# Patient Record
Sex: Male | Born: 1965 | Race: White | Hispanic: No | Marital: Married | Smoking: Never smoker
Health system: Southern US, Community
[De-identification: ages and names within clinical notes are randomized; demographics above are authoritative.]

## PROBLEM LIST (undated history)

## (undated) DIAGNOSIS — Z0000 Encounter for general adult medical examination without abnormal findings: Secondary | ICD-10-CM

## (undated) DIAGNOSIS — R5383 Other fatigue: Secondary | ICD-10-CM

## (undated) DIAGNOSIS — R0609 Other forms of dyspnea: Secondary | ICD-10-CM

## (undated) DIAGNOSIS — E079 Disorder of thyroid, unspecified: Secondary | ICD-10-CM

## (undated) DIAGNOSIS — R001 Bradycardia, unspecified: Secondary | ICD-10-CM

## (undated) DIAGNOSIS — M25561 Pain in right knee: Secondary | ICD-10-CM

## (undated) DIAGNOSIS — M542 Cervicalgia: Secondary | ICD-10-CM

## (undated) DIAGNOSIS — E349 Endocrine disorder, unspecified: Secondary | ICD-10-CM

## (undated) DIAGNOSIS — M779 Enthesopathy, unspecified: Principal | ICD-10-CM

## (undated) DIAGNOSIS — J309 Allergic rhinitis, unspecified: Secondary | ICD-10-CM

## (undated) DIAGNOSIS — G4733 Obstructive sleep apnea (adult) (pediatric): Secondary | ICD-10-CM

## (undated) DIAGNOSIS — R0989 Other specified symptoms and signs involving the circulatory and respiratory systems: Secondary | ICD-10-CM

## (undated) DIAGNOSIS — R739 Hyperglycemia, unspecified: Secondary | ICD-10-CM

## (undated) DIAGNOSIS — Z025 Encounter for examination for participation in sport: Secondary | ICD-10-CM

## (undated) DIAGNOSIS — E785 Hyperlipidemia, unspecified: Secondary | ICD-10-CM

## (undated) DIAGNOSIS — T7840XA Allergy, unspecified, initial encounter: Secondary | ICD-10-CM

## (undated) DIAGNOSIS — R51 Headache: Secondary | ICD-10-CM

## (undated) DIAGNOSIS — R5381 Other malaise: Secondary | ICD-10-CM

## (undated) DIAGNOSIS — Z8619 Personal history of other infectious and parasitic diseases: Secondary | ICD-10-CM

## (undated) DIAGNOSIS — E663 Overweight: Secondary | ICD-10-CM

## (undated) DIAGNOSIS — G2581 Restless legs syndrome: Secondary | ICD-10-CM

## (undated) HISTORY — DX: Other specified symptoms and signs involving the circulatory and respiratory systems: R09.89

## (undated) HISTORY — DX: Restless legs syndrome: G25.81

## (undated) HISTORY — DX: Headache: R51

## (undated) HISTORY — DX: Pain in right knee: M25.561

## (undated) HISTORY — DX: Personal history of other infectious and parasitic diseases: Z86.19

## (undated) HISTORY — DX: Other forms of dyspnea: R06.09

## (undated) HISTORY — DX: Cervicalgia: M54.2

## (undated) HISTORY — DX: Hyperglycemia, unspecified: R73.9

## (undated) HISTORY — DX: Disorder of thyroid, unspecified: E07.9

## (undated) HISTORY — DX: Endocrine disorder, unspecified: E34.9

## (undated) HISTORY — DX: Overweight: E66.3

## (undated) HISTORY — DX: Allergy, unspecified, initial encounter: T78.40XA

## (undated) HISTORY — DX: Morbid (severe) obesity due to excess calories: E66.01

## (undated) HISTORY — DX: Other fatigue: R53.83

## (undated) HISTORY — DX: Other malaise: R53.81

## (undated) HISTORY — DX: Obstructive sleep apnea (adult) (pediatric): G47.33

## (undated) HISTORY — DX: Allergic rhinitis, unspecified: J30.9

## (undated) HISTORY — DX: Hyperlipidemia, unspecified: E78.5

## (undated) HISTORY — DX: Encounter for examination for participation in sport: Z02.5

## (undated) HISTORY — DX: Bradycardia, unspecified: R00.1

## (undated) HISTORY — DX: Encounter for general adult medical examination without abnormal findings: Z00.00

## (undated) HISTORY — DX: Enthesopathy, unspecified: M77.9

---

## 1998-10-20 HISTORY — PX: OTHER SURGICAL HISTORY: SHX169

## 2008-06-09 ENCOUNTER — Encounter: Payer: Self-pay | Admitting: Internal Medicine

## 2010-03-12 ENCOUNTER — Telehealth: Payer: Self-pay | Admitting: Internal Medicine

## 2010-03-12 ENCOUNTER — Ambulatory Visit: Payer: Self-pay | Admitting: Internal Medicine

## 2010-03-12 DIAGNOSIS — J309 Allergic rhinitis, unspecified: Secondary | ICD-10-CM | POA: Insufficient documentation

## 2010-03-12 DIAGNOSIS — R5383 Other fatigue: Secondary | ICD-10-CM | POA: Insufficient documentation

## 2010-03-12 DIAGNOSIS — R0989 Other specified symptoms and signs involving the circulatory and respiratory systems: Secondary | ICD-10-CM

## 2010-03-12 DIAGNOSIS — R5381 Other malaise: Secondary | ICD-10-CM

## 2010-03-12 DIAGNOSIS — R519 Headache, unspecified: Secondary | ICD-10-CM | POA: Insufficient documentation

## 2010-03-12 DIAGNOSIS — R51 Headache: Secondary | ICD-10-CM

## 2010-03-12 DIAGNOSIS — R0609 Other forms of dyspnea: Secondary | ICD-10-CM | POA: Insufficient documentation

## 2010-03-12 HISTORY — DX: Other specified symptoms and signs involving the circulatory and respiratory systems: R06.09

## 2010-03-12 HISTORY — DX: Headache: R51

## 2010-03-12 HISTORY — DX: Other malaise: R53.81

## 2010-03-12 HISTORY — DX: Allergic rhinitis, unspecified: J30.9

## 2010-03-12 HISTORY — DX: Other specified symptoms and signs involving the circulatory and respiratory systems: R09.89

## 2010-03-12 LAB — CONVERTED CEMR LAB
Albumin: 4.6 g/dL (ref 3.5–5.2)
Alkaline Phosphatase: 40 units/L (ref 39–117)
Bilirubin, Direct: 0.1 mg/dL (ref 0.0–0.3)
CO2: 22 meq/L (ref 19–32)
Chloride: 104 meq/L (ref 96–112)
Creatinine, Ser: 0.78 mg/dL (ref 0.40–1.50)
Eosinophils Absolute: 0.2 10*3/uL (ref 0.0–0.7)
Eosinophils Relative: 3 % (ref 0–5)
Glucose, Bld: 82 mg/dL (ref 70–99)
HCT: 43.6 % (ref 39.0–52.0)
HDL: 45 mg/dL (ref >39)
Hemoglobin: 14.7 g/dL (ref 13.0–17.0)
LDL Cholesterol: 159 mg/dL — ABNORMAL HIGH (ref 0–99)
Lymphs Abs: 2.5 10*3/uL (ref 0.7–4.0)
MCV: 85 fL (ref 78.0–100.0)
Monocytes Absolute: 1 10*3/uL (ref 0.1–1.0)
Monocytes Relative: 13 % — ABNORMAL HIGH (ref 3–12)
Neutrophils Relative %: 50 % (ref 43–77)
RBC: 5.13 M/uL (ref 4.22–5.81)
TSH: 6.165 microintl units/mL — ABNORMAL HIGH (ref 0.350–4.500)
Total Bilirubin: 0.6 mg/dL (ref 0.3–1.2)
WBC: 7.5 10*3/uL (ref 4.0–10.5)

## 2010-03-13 ENCOUNTER — Telehealth: Payer: Self-pay | Admitting: Internal Medicine

## 2010-03-13 ENCOUNTER — Encounter: Payer: Self-pay | Admitting: Internal Medicine

## 2010-03-13 DIAGNOSIS — E039 Hypothyroidism, unspecified: Secondary | ICD-10-CM | POA: Insufficient documentation

## 2010-04-09 ENCOUNTER — Ambulatory Visit (HOSPITAL_BASED_OUTPATIENT_CLINIC_OR_DEPARTMENT_OTHER): Admission: RE | Admit: 2010-04-09 | Discharge: 2010-04-09 | Payer: Self-pay | Admitting: Internal Medicine

## 2010-04-09 ENCOUNTER — Encounter: Payer: Self-pay | Admitting: Internal Medicine

## 2010-04-14 ENCOUNTER — Ambulatory Visit: Payer: Self-pay | Admitting: Pulmonary Disease

## 2010-04-24 ENCOUNTER — Telehealth: Payer: Self-pay | Admitting: Internal Medicine

## 2010-04-24 DIAGNOSIS — G4733 Obstructive sleep apnea (adult) (pediatric): Secondary | ICD-10-CM

## 2010-04-24 DIAGNOSIS — G4730 Sleep apnea, unspecified: Secondary | ICD-10-CM | POA: Insufficient documentation

## 2010-04-24 HISTORY — DX: Obstructive sleep apnea (adult) (pediatric): G47.33

## 2010-04-30 ENCOUNTER — Encounter: Payer: Self-pay | Admitting: Internal Medicine

## 2010-05-10 ENCOUNTER — Encounter: Payer: Self-pay | Admitting: Internal Medicine

## 2010-05-13 ENCOUNTER — Telehealth: Payer: Self-pay | Admitting: Internal Medicine

## 2010-06-07 ENCOUNTER — Ambulatory Visit: Payer: Self-pay | Admitting: Internal Medicine

## 2010-07-09 ENCOUNTER — Encounter: Payer: Self-pay | Admitting: Internal Medicine

## 2010-07-29 ENCOUNTER — Ambulatory Visit: Payer: Self-pay | Admitting: Internal Medicine

## 2010-07-29 DIAGNOSIS — J018 Other acute sinusitis: Secondary | ICD-10-CM | POA: Insufficient documentation

## 2010-10-03 ENCOUNTER — Telehealth: Payer: Self-pay | Admitting: Internal Medicine

## 2010-10-07 LAB — CONVERTED CEMR LAB
Cholesterol: 230 mg/dL — ABNORMAL HIGH (ref 0–200)
Triglycerides: 211 mg/dL — ABNORMAL HIGH (ref <150)
VLDL: 42 mg/dL — ABNORMAL HIGH (ref 0–40)

## 2010-11-19 NOTE — Assessment & Plan Note (Signed)
Summary: NEW PT EST CARE/DT   Vital Signs:  Patient profile:   45 year old male Height:      68.5 inches Weight:      236 pounds BMI:     35.49 O2 Sat:      97 % on Room air Temp:     97.8 degrees F oral Pulse rate:   65 / minute Pulse rhythm:   regular Resp:     18 per minute BP sitting:   100 / 72  (right arm) Cuff size:   large  Vitals Entered By: Glendell Docker CMA (Mar 12, 2010 1:26 PM)  O2 Flow:  Room air CC: Rm 2- New Patient Comments c/o chronic fatigue fro the past 5 months, loud snoring per wife, 2 boiled eggs at 6 am -nothing since then   Primary Care Provider:  Dondra Spry DO  CC:  Rm 2- New Patient.  History of Present Illness: 45 y/o white male to establish.  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  denies diabetes, CAD wife has noticed loud snoring prev weighed 190-200 when he lived in [place]  last physical 2 yrs ago mildly elevated cholesterol  usual diet reviewed  Preventive Screening-Counseling & Management  Alcohol-Tobacco     Alcohol drinks/day: <1     Alcohol type: beer     Smoking Status: never  Caffeine-Diet-Exercise     Caffeine use/day: 3-5 beverages daily     Does Patient Exercise: yes     Type of exercise: bicycling     Times/week: 3  Allergies (verified): No Known Drug Allergies  Past History:  Past Medical History: Allergic rhinitis Headache Obesity  Past Surgical History: Left Tendon Bicep Repair - Dr. 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  The patient denies chest pain, prolonged cough, abdominal pain, melena, hematochezia, severe indigestion/heartburn, and depression.         no polyuria or polydypsia  Physical Exam  General:  alert and overweight-appearing.   Eyes:  pupils equal, pupils round, and pupils reactive to light.   Neck:  No deformities, masses, or tenderness noted.no carotid bruits.   Lungs:  normal respiratory effort, normal breath sounds, no crackles, and no wheezes.   Heart:  normal rate, regular rhythm, no murmur, and no gallop.   Abdomen:  obese, non-tender, normal bowel sounds, no masses, no hepatomegaly, and no splenomegaly.   Msk:  smaller nodular cyst on base of middle finger Pulses:  dorsalis pedis and posterior tibial pulses are full and equal bilaterally Extremities:  No lower extremity edema Neurologic:  cranial nerves II-XII intact and gait normal.  Psych:  normally interactive, good eye contact, not anxious appearing, and not depressed appearing.     Impression & Recommendations:  Problem # 1:  FATIGUE (ICD-780.79) chronic fatigue in 45 y/o with obesity and loud snoring.  probable OSA vs fatigue assoc with shift work.  rule out other cause we discussed wt loss strategies in detail  Orders: T-Basic Metabolic Panel [phone]) T-Lipid Profile [phone]) T-Hepatic Function [phone]) T-CBC w/Diff [phone]) T-TSH [phone]) Sleep Disorder Referral (Sleep Disorder)  Problem # 2:  SNORING (ICD-786.09)  Orders: Sleep Disorder Referral (Sleep Disorder)  Complete Medication List: 1)  Multivitamins Tabs (Multiple vitamin) .... Take 1 tablet by mouth  once a day 2)  Claritin 10 Mg Tabs (Loratadine) .... Take 1 tablet by mouth once a day as needed 3)  Glucosamine-chondroitin Caps (Glucosamine-chondroit-vit c-mn) .... Take 1 capsule by mouth once a day  Patient Instructions: 1)  http://www.my-calorie-counter.com/ 2)  Limit to 1800 cal per day 3)  Goal weight loss 1-2 lbs per week 4)  Avoid soft drinks, sweet tea and fruit juices 5)  Limit carbohydrated to 30 grams per meal (100 grams per day) 6)  Please schedule a follow-up appointment in 3 months.  Current Allergies (reviewed today): No known allergies    Preventive Care Screening  Last Tetanus Booster:    Date:  03/10/2000    Results:  Historical

## 2010-11-19 NOTE — Letter (Signed)
   [place] [facility] [address] [place], [place]  [address]  [place] Phone: [phone]      Mar 13, 2010   NAYIB REMER [address] [place], [place] [address]  RE:  LAB RESULTS  Dear  Mr. Rodriguez,  The following is an interpretation of your most recent lab tests.  Please take note of any instructions provided or changes to medications that have resulted from your lab work.  ELECTROLYTES:  Good - no changes needed  KIDNEY FUNCTION TESTS:  Good - no changes needed  LIVER FUNCTION TESTS:  Good - no changes needed  LIPID PANEL:  Fair - review at your next visit Triglyceride: 210   Cholesterol: 246   LDL: 159   HDL: 45   Chol/HDL%:  5.5 Ratio  THYROID STUDIES:  Thyroid level too low TSH: 6.165     CBC:  Good - no changes needed       Sincerely Yours,    Dr. Thomos Lemons

## 2010-11-19 NOTE — Letter (Signed)
Summary: [facility]  [facility]   Imported By: Lanelle Bal 03/27/2010 13:28:22  _____________________________________________________________________  External Attachment:    Type:   Image     Comment:   External Document

## 2010-11-19 NOTE — Letter (Signed)
   [place] [facility] [address] [place], [place]  [address]  [place] Phone: [phone]      April 30, 2010   Dustin Spears [address] [place], [place] [address]  RE:  LAB RESULTS  Dear  [name],  The following is an interpretation of your most recent lab tests.  Please take note of any instructions provided or changes to medications that have resulted from your lab work.  ELECTROLYTES:  Good - no changes needed  KIDNEY FUNCTION TESTS:  Good - no changes needed  LIPID PANEL:  Fair - review at your next visit Triglyceride: 210   Cholesterol: 246   LDL: 159   HDL: 45   Chol/HDL%:  5.5 Ratio  THYROID STUDIES:  Thyroid level too low TSH: 6.165     CBC:  Good - no changes needed  Hypothyroidism can contribute to elevated cholesterol.  I suggest we repeat your cholesterol panel after hypothyroidism adequately treated.  You should still avoid saturated fats and exercise regularly.         Sincerely Yours,    Dr. Thomos Lemons

## 2010-11-19 NOTE — Letter (Signed)
Summary: Unable to Contact Patient/[facility]  Unable to Contact Patient/[facility]   Imported By: Lanelle Bal 07/23/2010 09:50:17  _____________________________________________________________________  External Attachment:    Type:   Image     Comment:   External Document

## 2010-11-19 NOTE — Progress Notes (Signed)
Summary: Lab Results  Phone Note Call from Patient Call back at Home Phone [phone]   Caller: Spouse- Tammy Summary of Call: patients wife called and left voice message requesting lab results. Her message states patient has not recieved the results from the blood work that he had done at his last office visit. Initial call taken by: Glendell Docker CMA,  April 24, 2010 8:00 AM  Follow-up for Phone Call        call returned to patient at [phone], no answer, voice message left informing patient call was being returned. Message left for patient to return call regarding test results Follow-up by: Glendell Docker CMA,  April 25, 2010 8:40 AM  Additional Follow-up for Phone Call Additional follow up Details #1::        SW wife, pt works at night so wakes up at 4pm, she will have him call us when he wakes up, he will also be inquiring about sleep study Diane Tomerlin  April 25, 2010 3:11 PM   New Problems: SLEEP APNEA, OBSTRUCTIVE (ICD-327.23)   Additional Follow-up for Phone Call Additional follow up Details #2::    patient informed sleep study was recieve in office yesterday, and Dr Artist Pais review he would recieve a return call regarding the test results. Patient was informed that Dr Artist Pais will return to the office on Tuesday. Patient is okay to wait until then for results Follow-up by: Glendell Docker CMA,  April 25, 2010 4:48 PM  Additional Follow-up for Phone Call Additional follow up Details #3:: Details for Additional Follow-up Action Taken: sleep study showed mild obstructive sleep apnea.  spleep specialist suggest wt loss and pt should consider using oral appliance.  we can refer him to Dr. Orpah Greek in [facility]  I will send letter on rest of labs Additional Follow-up by: D. Thomos Lemons DO,  April 30, 2010 1:11 PM  New Problems: SLEEP APNEA, OBSTRUCTIVE (ICD-327.23)  attempted to contact patient at [phone], no answer. A detailed voice message was left informing patient per Dr Artist Pais instructiions.  Patient was advised to call back if htere were any questions Glendell Docker [facility]  May 01, 2010 9:07 AM

## 2010-11-19 NOTE — Assessment & Plan Note (Signed)
Summary: feels like sinus infection/dt   Vital Signs:  Patient profile:   45 year old male Weight:      236.25 pounds BMI:     35.53 O2 Sat:      97 % on Room air Temp:     98.3 degrees F oral Pulse rate:   70 / minute Pulse rhythm:   regular Resp:     18 per minute BP sitting:   102 / 70  (right arm) Cuff size:   large  Vitals Entered By: Glendell Docker CMA (July 29, 2010 3:52 PM)  O2 Flow:  Room air  Contraindications/Deferment of Procedures/Staging:    Test/Procedure: FLU VAX    Reason for deferment: patient declined  CC: Chest Congestion Is Patient Diabetic? No Pain Assessment Patient in pain? no        Primary Care Provider:  Dondra Spry DO  CC:  Chest Congestion.  History of Present Illness: 45 y/o male c/o past week, sore throat, head and chest congestion, productive cough yellow in color, taken mucinex with no relief, motrin and Tylenol with relief of throat pain, cough is worse when lying down mild short of breath no fever  Preventive Screening-Counseling & Management  Alcohol-Tobacco     Smoking Status: never  Allergies (verified): No Known Drug Allergies  Past History:  Past Medical History: Allergic rhinitis Headache  Obesity    Past Surgical History: Left Tendon Bicep Repair - Dr. 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   Ears:  R and L TM retracted Mouth:  pharyngeal erythema.   Lungs:  normal respiratory effort, normal breath sounds, and no wheezes.   Heart:  normal rate, regular rhythm, and no gallop.     Impression & Recommendations:  Problem # 1:  RHINOSINUSITIS, ACUTE (ICD-461.8)  His updated medication list for this problem includes:    Cefuroxime Axetil 500 Mg Tabs (Cefuroxime axetil) ..... One  by mouth two times a day  Instructed on treatment. Call if symptoms persist or worsen.   Complete Medication List: 1)  Multivitamins Tabs (Multiple vitamin) .... Take 1 tablet by mouth once a day 2)  Claritin 10 Mg Tabs (Loratadine) .... Take 1 tablet by mouth once a day as needed 3)  Glucosamine-chondroitin Caps (Glucosamine-chondroit-vit c-mn) .... Take 1 capsule by mouth once a day 4)  Levothyroxine Sodium 50 Mcg Tabs (Levothyroxine sodium) .... Take 1 tablet by mouth once a day 5)  Cefuroxime Axetil 500 Mg Tabs (Cefuroxime axetil) .... One by mouth two times a day  Patient Instructions: 1)  Call our office if your symptoms do not  improve or gets worse. Prescriptions: CEFUROXIME AXETIL 500 MG TABS (CEFUROXIME AXETIL) one by mouth two times a day  #20 x 0   Entered and Authorized by:   D. Thomos Lemons DO   Signed by:   D. Thomos Lemons DO on 07/29/2010   Method used:   [facility]        [facility]  [place] [address] [facility]retail[facility]       [address]       [place], [place]  [address]       Ph: [phone] or [phone]       Fax: [phone]  RxID:   [id number]   Current Allergies (reviewed today): No known allergies    Immunization History:  Influenza Immunization History:    Influenza:  declined (07/29/2010)

## 2010-11-19 NOTE — Progress Notes (Signed)
Summary: Tsh result  Phone Note Outgoing Call   Summary of Call: call pt - blood test shows pt is hypothyroid (underactive thyroid),  I will discuss cause at next OV I suggest he start thyroid replacement.  see rx.  arrange repeat TSH and obtain thyroid antibody panel  in 2 months  244.90 Initial call taken by: D. Thomos Lemons DO,  Mar 13, 2010 7:27 AM  Follow-up for Phone Call        Left message on machine to return my call.  Mervin Kung CMA  Mar 13, 2010 8:58 AM   Pt notified per Dr. Olegario Messier instructions.  Lab appt. set for 05/10/10. Order created and sent to lab.  Mervin Kung CMA  Mar 14, 2010 4:14 PM   New Problems: HYPOTHYROIDISM (ICD-244.9)   New Problems: HYPOTHYROIDISM (ICD-244.9) New/Updated Medications: LEVOTHYROXINE SODIUM 50 MCG TABS (LEVOTHYROXINE SODIUM) 1/2 by mouth once daily x 2 weeks, then one by mouth once daily Prescriptions: LEVOTHYROXINE SODIUM 50 MCG TABS (LEVOTHYROXINE SODIUM) 1/2 by mouth once daily x 2 weeks, then one by mouth once daily  #30 x 2   Entered and Authorized by:   D. Thomos Lemons DO   Signed by:   D. Thomos Lemons DO on 03/13/2010   Method used:   [facility]        [facility]  [place] [address] [facility]       [address]       [place], [place]  [address]       Ph: [phone] or [phone]       Fax: [phone]   RxID:   [id number]

## 2010-11-19 NOTE — Progress Notes (Signed)
Summary: Lab Results  Phone Note Call from Patient Call back at Home Phone [phone]   Caller: Spouse-Tammy Call For: D. Thomos Lemons DO Reason for Call: Lab or Test Results Summary of Call: Patients wife called and left voice message requesting lab results for patient. She would also like to know if there is a dental clinic closer that patient could be set up, so they will not have to drive to [facility] Initial call taken by: Glendell Docker CMA,  May 13, 2010 5:15 PM  Follow-up for Phone Call        some lab results still pending.    we will call when full results avail  Follow-up by: D. Thomos Lemons DO,  May 13, 2010 5:22 PM  Additional Follow-up for Phone Call Additional follow up Details #1::        call returned to patient at [phone], no answer. A detailed voice message was left informing patient per Dr Artist Pais instructions Additional Follow-up by: Glendell Docker CMA,  May 14, 2010 9:19 AM    Additional Follow-up for Phone Call Additional follow up Details #2::    SW wife,Tammy Harig [phone], she wanted to ck for pt to see if the rest of his test results have come in Diane Tomerlin  May 16, 2010 3:21 PM  call returned to patient & wife, Babette Relic states patient is in need of refill of thyroid medication. He was advised per Dr Artist Pais that he will continue at the same dose. Once the remainder of labs are resulted out, patient would recieve a return call.  Patient & wife verbalized understanding and agrees to await on return call. Follow-up by: Glendell Docker CMA,  May 16, 2010 3:27 PM  Additional Follow-up for Phone Call Additional follow up Details #3:: Details for Additional Follow-up Action Taken: call pt - thyroid antibody neg. TSH improved with pt taking thyroid medication continue same dose.  Repeat TSH in 2 months 244.9.  FLP should be drawn at same time.  272.4 Additional Follow-up by: D. Thomos Lemons DO,  May 20, 2010 5:38 PM  New/Updated Medications: LEVOTHYROXINE SODIUM  50 MCG TABS (LEVOTHYROXINE SODIUM) Take 1 tablet by mouth once a day Prescriptions: LEVOTHYROXINE SODIUM 50 MCG TABS (LEVOTHYROXINE SODIUM) Take 1 tablet by mouth once a day  #30 x 3   Entered by:   Glendell Docker CMA   Authorized by:   D. Thomos Lemons DO   Signed by:   Glendell Docker CMA on 05/16/2010   Method used:   [facility]  [place] [address]       [address]       [place], [place]  [address]       Ph: [phone] or [phone]       Fax: [phone]   RxID:   [id number]  attempted to contact patient at [phone], no answer. A detailed voice message was left informing patient per Dr Artist Pais instructions. Message was left for patient to return call if any questions Glendell Docker [facility]  May 21, 2010 8:54 AM
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
## 2010-11-19 NOTE — Progress Notes (Signed)
Summary: request for medical records faxed to [facility]   Phone Note Outgoing Call   Call placed by: [facility] Call placed to: Marj Summary of Call: Request for medical records faxed to [facility] Initial call taken by: Roselle Locus,  Mar 12, 2010 1:26 PM

## 2010-11-21 NOTE — Progress Notes (Signed)
Summary: Thyroid Refill, additional question  Phone Note Call from Patient Call back at [facility] Phone [phone] Call back at [phone]. May leave detailed message on voicemail.   Caller: Patient Call For: D. Thomos Lemons DO Summary of Call: patient called requesting a refill on his Thyroid medication. Does patient need follow up blood work?  Patient does not have future appointment on file last office visit was  10/10 for sinus infection Initial call taken by: Glendell Docker CMA,  October 03, 2010 5:10 PM  Follow-up for Phone Call        refill x 1.  yes he needs repeat TSH 244.9 Follow-up by: D. Thomos Lemons DO,  October 03, 2010 5:31 PM  Additional Follow-up for Phone Call Additional follow up Details #1::        Left message for pt to return my call. Need to verify pharmacy and set up time for TSH. Nicki Guadalajara Fergerson CMA Duncan Dull)  October 04, 2010 8:36 AM     Additional Follow-up for Phone Call Additional follow up Details #2::    Pt notified. [facility]. Lab order has been placed and faxed to the lab for Monday. Pt asked about having his blood typed as he states he does not know what his is. Advised pt that this may not be covered by his insurance. Pt states he will check with his insurance and let us know if he wants to proceed with blood typing.  Nicki Guadalajara Fergerson CMA Duncan Dull)  October 04, 2010 9:33 AM   call pt - TSH is normal.  continue same dose of thyroid medication.  cholesterol is elevated.  I suggest pt start cholesterol medication.  see rx.  needs AST, ALT, FLP in 3 months then OV.   Dondra Spry DO,  October 08, 2010 9:28 AM  Left message on machine to return my call. Nicki Guadalajara Fergerson CMA Duncan Dull)  October 08, 2010 10:10 AM   Additional Follow-up for Phone Call Additional follow up Details #3:: Details for Additional Follow-up Action Taken: Pt notified and states he was not fasting for either blood draw that he has had. Wants to know if you still want him to start on  cholesterol medication? Please advise. Nicki Guadalajara Fergerson CMA Duncan Dull)  October 08, 2010 10:59 AM   yes,  LDL not affected by non fasting state. Dondra Spry DO,  October 08, 2010 5:11 PM  Left detailed message on voicemail per Dr Olegario Messier instruction. Lab order has been faxed to the lab and appt reminder has been mailed to pt. Nicki Guadalajara Fergerson CMA Duncan Dull)  October 09, 2010 11:42 AM    New/Updated Medications: LIPITOR 20 MG TABS (ATORVASTATIN CALCIUM) one by mouth once daily Prescriptions: LIPITOR 20 MG TABS (ATORVASTATIN CALCIUM) one by mouth once daily  #30 x 5   Entered and Authorized by:   D. Thomos Lemons DO   Signed by:   D. Thomos Lemons DO on 10/08/2010   Method used:   [facility] [address] [facility]       [address]       [place], [place]  [address]       Ph: [phone] or [phone]       Fax: [phone]   RxID:   [id number] LEVOTHYROXINE SODIUM 50 MCG TABS (LEVOTHYROXINE SODIUM) Take 1 tablet by mouth once a day  #90 x 1   Entered and Authorized by:   D.  Thomos Lemons DO   Signed by:   D. Thomos Lemons DO on 10/08/2010   Method used:   [facility]        [facility]  [place] [address] [facility]       [address]       [place], [place]  [address]       Ph: [phone] or [phone]       Fax: [phone]   RxID:   [id number] LEVOTHYROXINE SODIUM 50 MCG TABS (LEVOTHYROXINE SODIUM) Take 1 tablet by mouth once a day  #30 x 0   Entered by:   Mervin Kung CMA (AAMA)   Authorized by:   D. Thomos Lemons DO   Signed by:   Mervin Kung CMA (AAMA) on 10/04/2010   Method used:   [facility]        [facility]  [place] [address]       [address]       [place], [place]  [address]       Ph: [phone] or [phone]       Fax: [phone]   RxID:   [id number]

## 2010-12-16 ENCOUNTER — Telehealth: Payer: Self-pay | Admitting: Internal Medicine

## 2010-12-26 NOTE — Progress Notes (Addendum)
Summary: refill--atorvastatin  Phone Note From Pharmacy   Summary of Call: Received fax refill request from [facility] for atorvastatin 20mg . Pt needs f/u with Dr Artist Pais in March, has appt for bloodwork. Left message for pt to return my call. Pt still has refills at local pharmacy. Can discuss mail order refills at appt in March.  Nicki Guadalajara Fergerson CMA Duncan Dull)  December 16, 2010 3:36 PM   Follow-up for Phone Call        Left message on machine to return my call. sign   Additional Follow-up for Phone Call Additional follow up Details #1::        Spoke to pt, he states that insurance will not cover rx at local pharmacy (pt cost will be over $100). Refill sent to [facility]. Nicki Guadalajara Fergerson CMA (AAMA)  December 17, 2010 10:00 AM     Prescriptions: LIPITOR 20 MG TABS (ATORVASTATIN CALCIUM) one by mouth once daily  #90 x 0   Entered by:   Mervin Kung CMA (AAMA)   Authorized by:   D. Thomos Lemons DO   Signed by:   Mervin Kung CMA (AAMA) on 12/17/2010   Method used:   Electronically to        [facility] (mail-order)       [address]       [place], [place]  [address]       Ph: [phone]       Fax: [phone]   RxID:   [id number]   Appended Document: refill--atorvastatin Received call from [place] at [facility] stating they did not receive our authorization on 12/17/10. Gave verbal per phone note to [place].

## 2011-02-26 ENCOUNTER — Ambulatory Visit (INDEPENDENT_AMBULATORY_CARE_PROVIDER_SITE_OTHER): Payer: Managed Care, Other (non HMO) | Admitting: Family Medicine

## 2011-02-26 ENCOUNTER — Encounter: Payer: Self-pay | Admitting: Family Medicine

## 2011-02-26 DIAGNOSIS — E785 Hyperlipidemia, unspecified: Secondary | ICD-10-CM | POA: Insufficient documentation

## 2011-02-26 DIAGNOSIS — Z8619 Personal history of other infectious and parasitic diseases: Secondary | ICD-10-CM | POA: Insufficient documentation

## 2011-02-26 DIAGNOSIS — R0609 Other forms of dyspnea: Secondary | ICD-10-CM

## 2011-02-26 DIAGNOSIS — E663 Overweight: Secondary | ICD-10-CM

## 2011-02-26 DIAGNOSIS — J309 Allergic rhinitis, unspecified: Secondary | ICD-10-CM

## 2011-02-26 DIAGNOSIS — M25569 Pain in unspecified knee: Secondary | ICD-10-CM

## 2011-02-26 DIAGNOSIS — G4733 Obstructive sleep apnea (adult) (pediatric): Secondary | ICD-10-CM

## 2011-02-26 DIAGNOSIS — R5381 Other malaise: Secondary | ICD-10-CM

## 2011-02-26 DIAGNOSIS — E039 Hypothyroidism, unspecified: Secondary | ICD-10-CM

## 2011-02-26 DIAGNOSIS — R51 Headache: Secondary | ICD-10-CM

## 2011-02-26 DIAGNOSIS — M25561 Pain in right knee: Secondary | ICD-10-CM | POA: Insufficient documentation

## 2011-02-26 DIAGNOSIS — R5383 Other fatigue: Secondary | ICD-10-CM

## 2011-02-26 DIAGNOSIS — R0989 Other specified symptoms and signs involving the circulatory and respiratory systems: Secondary | ICD-10-CM

## 2011-02-26 HISTORY — DX: Overweight: E66.3

## 2011-02-26 HISTORY — DX: Morbid (severe) obesity due to excess calories: E66.01

## 2011-02-26 MED ORDER — LEVOTHYROXINE SODIUM 50 MCG PO TABS: 50.0000 ug | ORAL_TABLET | Freq: Every day | ORAL | Status: DC

## 2011-02-26 NOTE — Patient Instructions (Signed)
Hypercholesterolemia High Blood Cholesterol Cholesterol is a white, waxy, fat-like protein needed by your body in small amounts. The liver makes all the cholesterol you need. It is carried from the liver by the blood through the blood vessels. Deposits (plaque) may build up on blood vessel walls. This makes the arteries narrower and stiffer. Plaque increases the risk for heart attack and stroke. You cannot feel your cholesterol level even if it is very high. The only way to know is by a blood test to check your lipid (fats) levels. Once you know your cholesterol levels, you should keep a record of the test results. Work with your caregiver to to keep your levels in the desired range. WHAT THE RESULTS MEAN:  Total cholesterol is a rough measure of all the cholesterol in your blood.   LDL is the so-called bad cholesterol. This is the type that deposits cholesterol in the walls of the arteries. You want this level to be low.   HDL is the good cholesterol because it cleans the arteries and carries the LDL away. You want this level to be high.   Triglycerides are fat that the body can either burn for energy or store. High levels are closely linked to heart disease.  DESIRED LEVELS:  Total cholesterol below 200.   LDL below 100 for people at risk, below 70 for very high risk.   HDL above 50 is good, above 60 is best.   Triglycerides below 150.  HOW TO LOWER YOUR CHOLESTEROL:  Diet.   Choose fish or white meat chicken and [place], roasted or baked. Limit fatty cuts of red meat, fried foods, and processed meats, such as sausage and lunch meat.   Eat lots of fresh fruits and vegetables. Choose whole grains, beans, pasta, potatoes and cereals.   Use only small amounts of olive, corn or canola oils. Avoid butter, mayonnaise, shortening or palm kernel oils. Avoid foods with trans-fats.   Use skim/nonfat milk and low-fat/nonfat yogurt and cheeses. Avoid whole milk, cream, ice cream, egg yolks and  cheeses. Healthy desserts include angel food cake, gingersnaps, animal crackers, hard candy, popsicles, and low-fat/nonfat frozen yogurt. Avoid pastries, cakes, pies and cookies.   Exercise.   A regular program helps decrease LDL and raises HDL.   Helps with weight control.   Do things that increase your activity level like gardening, walking, or taking the stairs.   Medication.   May be prescribed by your caregiver to help lowering cholesterol and the risk for heart disease.   You may need medicine even if your levels are normal if you have several risk factors.  HOME CARE INSTRUCTIONS  Follow your diet and exercise programs as suggested by your caregiver.   Take medications as directed.   Have blood work done when your caregiver feels it is necessary.  MAKE SURE YOU:   Understand these instructions.   Will watch your condition.   Will get help right away if you are not doing well or get worse.  Document Released: 10/06/2005 Document Re-Released: 09/18/2008 [facility] Patient Information 2011 [place], [place].Hypothyroidism The thyroid is a large gland located in the lower front of your neck. The thyroid gland helps control metabolism. Metabolism is how your body handles food. It controls metabolism with the hormone thyroxine. When this gland is underactive (hypothyroid), it produces too little hormone.  SYMPTOMS OF HYPOTHYROIDISM  Lethargy (feeling as though you have no energy)   Cold intolerance   Weight gain (in spite of normal food intake)  Dry skin   Coarse hair   Menstrual irregularity (if severe, may lead to infertility)   Slowing of thought processes  Cardiac problems are also caused by insufficient amounts of thyroid hormone. Hypothyroidism in the newborn is cretinism, and is an extreme form. It is important that this form be treated adequately and immediately or it will lead rapidly to retarded physical and mental development. CAUSES OF HYPOTHYROIDISM These  include:   Absence or destruction of thyroid tissue.  Goiter due to iodine deficiency.   Goiter due to medications.   Congenital defects (since birth).  Problems with the pituitary. This causes a lack of TSH (thyroid stimulating hormone). This hormone tells the thyroid to turn out more hormone.   DIAGNOSIS To prove hypothyroidism, your caregiver may do blood tests and ultrasound tests. Sometimes the signs are hidden. It may be necessary for your caregiver to watch this illness with blood tests either before or after diagnosis and treatment. TREATMENT  Low levels of thyroid hormone are increased by using synthetic thyroid hormone. This is a safe, effective treatment. It usually takes about four weeks to gain the full effects of the medication. After you have the full effect of the medication, it will generally take another four weeks for problems to leave. Your caregiver may start you on low doses. If you have had heart problems the dose may be gradually increased. It is generally not an emergency to get rapidly to normal. HOME CARE INSTRUCTIONS  Take your medications as your caregiver suggests. Let your caregiver know of any medications you are taking or start taking. Your caregiver will help you with dosage schedules.   As your condition improves, your dosage needs may increase. It will be necessary to have continuing blood tests as suggested by your caregiver.   Report all suspected medication side effects to your caregiver.  SEEK MEDICAL CARE IF YOU DEVELOP:  Sweating.  Tremulousness (tremors).   Anxiety.   Rapid weight loss.   Heat intolerance.  Emotional swings.   Diarrhea.   Weakness.   SEEK IMMEDIATE MEDICAL CARE IF: You develop chest pain, an irregular heart beat (palpitations), or a rapid heart beat. MAKE SURE YOU:   Understand these instructions.   Will watch your condition.   Will get help right away if you are not doing well or get worse.  Document Released:  10/06/2005 Document Re-Released: 09/18/2008 [facility] Patient Information 2011 [place], [place].   Consider fish oil or flaxseed oil cap daily and add a fiber supplement: Benefiber 2 tsp po daily can mix in fluids or food

## 2011-02-26 NOTE — Assessment & Plan Note (Signed)
Patient to use his Claritin prn and consider Nasal saline prn, report worsening symptoms

## 2011-02-26 NOTE — Assessment & Plan Note (Signed)
Has been told he has an torn ligament and has considered surgery in the past but has not proceeded at this time the pain is worsening so he is beginning to consider proceeding. Will cont Aleve prn and Glucosamine Chondroitin daily

## 2011-02-26 NOTE — Assessment & Plan Note (Signed)
Patient tolerating his Lipitor, will check his FLP and he is encouraged to avoid trans fats and to add fish oil and fiber daily

## 2011-02-26 NOTE — Assessment & Plan Note (Signed)
Encouraged decreased po intake increased exercise and avoid trans fats

## 2011-02-26 NOTE — Assessment & Plan Note (Signed)
Patient did obtain a dental appliance last year which he didn't feel helped the snoring somewhat but over the last few months he feels the snoring has worsened again as  has his fatigue. Will consider a prescription dental appliance

## 2011-02-26 NOTE — Progress Notes (Signed)
Dustin Spears [id number] 03/09/1966 02/26/2011      Progress Note New Patient  Subjective  Chief Complaint  Chief Complaint  Patient presents with  . Establish Care    new pt    HPI  Patient is a 45 year old Caucasian male in today for new patient appointment. He has a complicated medical history of sleep apnea, hyperlipidemia, elevated body mass index, chronic musculoskeletal complaints. He has a bad right knee with torn ligaments considering surgery. Also has a long history of recurrent plantar fasciitis bilaterally and bilateral epicondylitis bilaterally. 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 Is doubling of worsening fatigue over the last 6 months. His previous sleep study showed mild sleep apnea with a low of 85% on his oxygen status dental appliance which she could not afford at that time but is now willing to consider. He has used over-the-counter dental appliance in the meantime which he thought was mildly helpful initially that at present he is snoring worse and his fatigue is increased. He denies any recent illness although he does have chronic nasal congestion secondary to allergies for which he takes Claritin and receives some relief. He denies any recent illness, fevers, chills. No CP/palp/SOB/GI or GU concerns. Does have numerous musculoskeletal complaints which are ongoing. 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 He reports he tore a ligament in that knee years ago and has been just managing it conservatively since then. He may consider surgery int he future. He has custom orthotics for for his recurrent plantar fasciitis and he feels that helps somewhat.   Past Medical History  Diagnosis Date  . Hyperlipidemia   . Allergy     seasonal  . Thyroid disease   . Overweight 02/26/2011  . Knee pain, right   . History of chicken pox   . SNORING 03/12/2010  . SLEEP APNEA, OBSTRUCTIVE  04/24/2010  . ALLERGIC RHINITIS 03/12/2010  . FATIGUE 03/12/2010  . Headache 03/12/2010    Past Surgical History  Procedure Date  . Bicep tendon in left arm 2000    Family History  Problem Relation Age of Onset  . Hyperlipidemia Mother   . Alcohol abuse Father     smoked  . Arthritis Father   . Lupus Paternal Grandmother   . Arthritis Maternal Grandmother   . Ulcers Maternal Grandmother   . Alzheimer's disease Maternal Grandfather     History   Social History  . Marital Status: Married    Spouse Name: N/A    Number of Children: N/A  . Years of Education: N/A   Occupational History  . Not on file.   Social History Main Topics  . Smoking status: Never Smoker   . Smokeless tobacco: Never Used  . Alcohol Use: Yes     occasional  . Drug Use: No  . Sexually Active: Yes   Other Topics Concern  . Not on file   Social History Narrative  . No narrative on file    No current outpatient prescriptions on file prior to visit.    No Known Allergies  Review of Systems  Gen.: Patient with persistent fatigue worsened over the last few months. Denies myalgias, fevers, chills. HEENT: Has a history of allergies no recent flares using using over-the-counter medications somewhat helpful. Denies headaches, ear pain, sore throat, epistaxis. Neck denies any dysphagia or trouble with range of  motion. Cardiovascular: Denies chest pain, palpitations. Pulmonary: Denies shortness of breath, cough, wheezing. GI: Denies constipation, diarrhea, bloody tarry stool. Denies nausea, vomiting, anorexia. GU: Patient denies urinary frequency, hesitancy, urgency, dysuria. There are patient denies any recent change in her concerning lesions, pruritus. Neuro patient denies any seizures or neurologic deficiencies. Psych: Patient denies any depression, anxiety or suicidal ideation.   Objective  BP 119/80  Pulse 66  Temp(Src) 98.4 F (36.9 C) (Oral)  Ht 5' 8.5" (1.74 m)  Wt 243 lb 12.8 oz (110.587 kg)   BMI 36.53 kg/m2  SpO2 96%  Physical Exam  Gen: W male, obese, NAD, good hygiene HEENT: NCAT, Ears nl pinna, TMs clear, no lesions in canal, right canal mildly erythematous, left canal clear. Nares are patent bilaterally. Boggy erythematous mucosa is noted. Oral pharynx is clear 0-1+ tonsils noted bilaterally, no erythema. No oral lesions. Neck is supple, no thyromegaly, no cervical lymphadenopathy noted, full range of motion. CV: Heart is noted to have a regular rate and rhythm. No murmur, rub appreciated. Pulm: Clear to auscultation bilaterally no wheezes rales or rhonchi. ABD: Positive bowel sounds all 4 quadrants, no hepatosplenomegaly no masses. No rebound, guarding. Derm: No concerning lesions Extr: No clubbing, cyanosis, edema. DTRs 2+ and B/L patellas. 5 out of 5 strength bilaterally. Neuro: Cranial nerves 2-12 intact. Gross motor and sensory intact. Psych: Normal affect. Alert and oriented x3. Memory intact.   Assessment and Plan   SNORING Patient did obtain a dental appliance last year which he didn't feel helped the snoring somewhat but over the last few months he feels the snoring has worsened again as  has his fatigue. Will consider a prescription dental appliance  SLEEP APNEA, OBSTRUCTIVE Patient did undergo a sleep study in June 2011 which showed mild obstructive sleep apnea. Since that time he actually has noted his fatigue is even worse and he continues to snore. He has tried an OTC dental appliance  But could note afford the cost of a visit to [facility] so he never went for that evaluation. He is willing to try a prescription appliance so we will try and arrange a consultation. If he is unable to afford the appliance we will order a CPAP machine  ALLERGIC RHINITIS Patient to use his Claritin prn and consider Nasal saline prn, report worsening symptoms  HEADACHE Patient is having them infrequently, may use Aleve prn with food, encouraged 8 hours of sleep,  increase exercise, small, frequent meals  Hyperlipidemia Patient tolerating his Lipitor, will check his FLP and he is encouraged to avoid trans fats and to add fish oil and fiber daily  Overweight Encouraged decreased po intake increased exercise and avoid trans fats  Knee pain, right Has been told he has an torn ligament and has considered surgery in the past but has not proceeded at this time the pain is worsening so he is beginning to consider proceeding. Will cont Aleve prn and Glucosamine Chondroitin daily  HYPOTHYROIDISM Given a 30 day refill on his Levothyroxine and we will check a level later this week.

## 2011-02-26 NOTE — Assessment & Plan Note (Signed)
Patient is having them infrequently, may use Aleve prn with food, encouraged 8 hours of sleep, increase exercise, small, frequent meals

## 2011-02-26 NOTE — Assessment & Plan Note (Addendum)
Patient did undergo a sleep study in June 2011 which showed mild obstructive sleep apnea. Since that time he actually has noted his fatigue is even worse and he continues to snore. He has tried an OTC dental appliance  But could note afford the cost of a visit to [facility] so he never went for that evaluation. He is willing to try a prescription appliance so we will try and arrange a consultation. If he is unable to afford the appliance we will order a CPAP machine

## 2011-02-27 NOTE — Assessment & Plan Note (Signed)
Given a 30 day refill on his Levothyroxine and we will check a level later this week.

## 2011-02-28 ENCOUNTER — Other Ambulatory Visit: Payer: Managed Care, Other (non HMO)

## 2011-02-28 LAB — RENAL FUNCTION PANEL
Albumin: 4 g/dL (ref 3.5–5.2)
BUN: 20 mg/dL (ref 6–23)
Calcium: 9 mg/dL (ref 8.4–10.5)
Glucose, Bld: 85 mg/dL (ref 70–99)
Phosphorus: 3.5 mg/dL (ref 2.3–4.6)
Potassium: 4.4 mEq/L (ref 3.5–5.1)

## 2011-02-28 LAB — CBC WITH DIFFERENTIAL/PLATELET
Basophils Absolute: 0 10*3/uL (ref 0.0–0.1)
Basophils Relative: 0.7 % (ref 0.0–3.0)
Eosinophils Absolute: 0.2 10*3/uL (ref 0.0–0.7)
Hemoglobin: 14.9 g/dL (ref 13.0–17.0)
Lymphocytes Relative: 36.5 % (ref 12.0–46.0)
MCHC: 34.6 g/dL (ref 30.0–36.0)
Monocytes Relative: 11.6 % (ref 3.0–12.0)
Neutrophils Relative %: 48.3 % (ref 43.0–77.0)
RBC: 4.86 Mil/uL (ref 4.22–5.81)
RDW: 12.9 % (ref 11.5–14.6)

## 2011-02-28 LAB — HEPATIC FUNCTION PANEL
ALT: 28 U/L (ref 0–53)
Albumin: 4 g/dL (ref 3.5–5.2)
Bilirubin, Direct: 0.1 mg/dL (ref 0.0–0.3)
Total Protein: 6.8 g/dL (ref 6.0–8.3)

## 2011-02-28 LAB — LIPID PANEL
Cholesterol: 166 mg/dL (ref 0–200)
HDL: 42.2 mg/dL (ref >39.00)
LDL Cholesterol: 98 mg/dL (ref 0–99)
Triglycerides: 131 mg/dL (ref 0.0–149.0)
VLDL: 26.2 mg/dL (ref 0.0–40.0)

## 2011-02-28 LAB — TSH: TSH: 1.45 u[IU]/mL (ref 0.35–5.50)

## 2011-02-28 NOTE — Progress Notes (Signed)
Addended by: Baldemar Lenis on: 02/28/2011 09:17 AM   Modules accepted: Orders

## 2011-03-12 ENCOUNTER — Encounter: Payer: Self-pay | Admitting: Family Medicine

## 2011-03-12 ENCOUNTER — Ambulatory Visit (INDEPENDENT_AMBULATORY_CARE_PROVIDER_SITE_OTHER): Payer: Managed Care, Other (non HMO) | Admitting: Family Medicine

## 2011-03-12 VITALS — BP 122/79 | HR 62 | Temp 97.6°F | Ht 68.5 in | Wt 244.8 lb

## 2011-03-12 DIAGNOSIS — M542 Cervicalgia: Secondary | ICD-10-CM | POA: Insufficient documentation

## 2011-03-12 DIAGNOSIS — G4733 Obstructive sleep apnea (adult) (pediatric): Secondary | ICD-10-CM

## 2011-03-12 HISTORY — DX: Cervicalgia: M54.2

## 2011-03-12 MED ORDER — NAPROXEN 500 MG PO TABS: 500.0000 mg | ORAL_TABLET | Freq: Two times a day (BID) | ORAL | Status: AC | PRN

## 2011-03-12 MED ORDER — CYCLOBENZAPRINE HCL 10 MG PO TABS: ORAL_TABLET | ORAL | Status: DC

## 2011-03-12 MED ORDER — LIDOCAINE 5 % EX PTCH: 1.0000 | MEDICATED_PATCH | Freq: Two times a day (BID) | CUTANEOUS | Status: DC

## 2011-03-12 NOTE — Assessment & Plan Note (Signed)
Patient directly to the history of right neck pain. Over the weekend he was bending over and had a tightening in the right side of his neck which is persistent. He denies any trauma. This morning he woke up with the muscle on the right side with a stent to the point where it's difficult to turn his head. Reports he's had similar milder episodes in the past. Organisms on the arms, headache or other concerning associated symptoms. Will treat for spasm noted in his Sternocleidomastoid muscle with moist heat and gentle stretching and his instructed to use th Flexeril sparingly and his Naproxen bid with food for ht next week and report if symptoms do not improve

## 2011-03-12 NOTE — Patient Instructions (Signed)
Back Pain/Neck pain Back pain is one of the most common causes of pain. There are many causes of back pain. Most are not serious conditions.  CAUSES Your backbone (spinal column) is made up of 24 main vertebral bodies, the sacrum, and the coccyx. These are held together by muscles and tough, fibrous tissue (ligaments). Nerve roots pass through the openings between the vertebrae. A sudden move or injury to the back may cause injury to, or pressure on, these nerves. This may result in localized back pain or pain movement (radiation) into the buttocks, down the leg, and into the foot. Sharp, shooting pain from the buttock down the back of the leg (sciatica) is frequently associated with a ruptured (herniated) disc. Pain may be caused by muscle spasm alone. Your caregiver can often find the cause of your pain by the details of your symptoms and an exam. In some cases, you may need tests (such as X-rays). Your caregiver will work with you to decide if any tests are needed based on your specific exam. HOME CARE INSTRUCTIONS  Avoid an underactive lifestyle. Active exercise, as directed by your caregiver, is your greatest weapon against back pain.   Avoid hard physical activities (tennis, racquetball, water-skiing) if you are not in proper physical condition for it. This may aggravate and/or create problems.   If you have a back problem, avoid sports requiring sudden body movements. Swimming and walking are generally safer activities.   Maintain good posture.   Avoid becoming overweight (obese).   Use bed rest for only the most extreme, sudden (acute) episode. Your caregiver will help you determine how much bed rest is necessary.   For acute conditions, you may put ice on the injured area.   Put ice in a plastic bag.   Place a towel between your skin and the bag.   Leave the heat on for 15 minutes at a time,or as needed.   After you are improved and more active, it may help to apply heat for 30  minutes before activities.  See your caregiver if you are having pain that lasts longer than expected. Your caregiver can advise appropriate exercises and/or therapy if needed. With conditioning, most back problems can be avoided. SEEK IMMEDIATE MEDICAL CARE IF:  You have numbness, tingling, weakness, or problems with the use of your arms or legs.   You experience severe back pain not relieved with medicines.   There is a change in bowel or bladder control.   You have increasing pain in any area of the body, including your belly (abdomen).   You notice shortness of breath, dizziness, or feel faint.   You feel sick to your stomach (nauseous), are throwing up (vomiting), or become sweaty.   You notice discoloration of your toes or legs, or your feet get very cold.   Your back pain is getting worse.   You have an oral temperature above 104, not controlled by medicine.  MAKE SURE YOU:   Understand these instructions.   Will watch your condition.   Will get help right away if you are not doing well or get worse.  Document Released: 07/16/2005 Document Re-Released: 12/31/2009 [facility] Patient Information 2011 [place], [place]. Apply moist heat and gentle stretching twice daily for the next week and or as needed

## 2011-03-12 NOTE — Progress Notes (Signed)
Dustin Spears [id number] [birth year] 03/12/2011      Progress Note-Follow Up  Subjective  Chief Complaint  Chief Complaint  Patient presents with  . Shoulder Injury    X 1day- right    HPI  Patient is a 45 year old male comes in today with a couple days of treatment records that. He says he bent over and had pain on the right side of his neck radiating into the top of his right shoulder a couple of days ago. This morning the pain is worse in the neck is worse this is difficult to turn his head. There are no jugular symptoms, numbness or tingling in his hands or in his arms. She denies any headache any other neurologic complaints. He denies any trauma or recent injury but has had similar episodes in the distant past. No fevers, chills, chest pain, palpitations, shortness of breath, GI or GU complaints otherwise noted.  Past Medical History  Diagnosis Date  . Hyperlipidemia   . Allergy     seasonal  . Thyroid disease   . Overweight 02/26/2011  . Knee pain, right   . History of chicken pox   . SNORING 03/12/2010  . SLEEP APNEA, OBSTRUCTIVE 04/24/2010  . ALLERGIC RHINITIS 03/12/2010  . FATIGUE 03/12/2010  . Headache 03/12/2010  . Neck pain, acute 03/12/2011    Past Surgical History  Procedure Date  . Bicep tendon in left arm 2000    Family History  Problem Relation Age of Onset  . Hyperlipidemia Mother   . Alcohol abuse Father     smoked  . Arthritis Father   . Lupus Paternal Grandmother   . Arthritis Maternal Grandmother   . Ulcers Maternal Grandmother   . Alzheimer's disease Maternal Grandfather     History   Social History  . Marital Status: Married    Spouse Name: N/A    Number of Children: N/A  . Years of Education: N/A   Occupational History  . Not on file.   Social History Main Topics  . Smoking status: Never Smoker   . Smokeless tobacco: Never Used  . Alcohol Use: Yes     occasional  . Drug Use: No  . Sexually Active: Yes   Other Topics Concern  . Not  on file   Social History Narrative  . No narrative on file    Current Outpatient Prescriptions on File Prior to Visit  Medication Sig Dispense Refill  . atorvastatin (LIPITOR) 20 MG tablet Take 20 mg by mouth daily.        Marland Kitchen glucosamine-chondroitin 500-400 MG tablet Take 1 tablet by mouth daily.        Marland Kitchen levothyroxine (SYNTHROID, LEVOTHROID) 50 MCG tablet Take 1 tablet (50 mcg total) by mouth daily.  30 tablet  0  . loratadine (CLARITIN) 10 MG tablet Take 10 mg by mouth daily.          No Known Allergies  Review of Systems  Review of Systems  Constitutional: Negative for fever and malaise/fatigue.  HENT: Positive for neck pain. Negative for congestion.   Respiratory: Negative for cough, sputum production, shortness of breath and wheezing.   Cardiovascular: Negative for chest pain and palpitations.  Gastrointestinal: Negative for heartburn, nausea, vomiting and abdominal pain.  Genitourinary: Negative for dysuria, urgency, frequency and hematuria.  Musculoskeletal: Negative for myalgias and back pain.  Skin: Negative for rash.  Neurological: Negative for dizziness, tremors, sensory change, focal weakness, loss of consciousness, weakness and headaches.  Psychiatric/Behavioral: Negative for depression.  The patient is not nervous/anxious.     Objective  BP 122/79  Pulse 62  Temp(Src) 97.6 F (36.4 C) (Oral)  Ht 5' 8.5" (1.74 m)  Wt 244 lb 12.8 oz (111.041 kg)  BMI 36.68 kg/m2  SpO2 99%  Physical Exam  Physical Exam  Constitutional: He is oriented to person, place, and time and well-developed, well-nourished, and in no distress. No distress.  HENT:  Head: Normocephalic and atraumatic.  Eyes: Conjunctivae are normal.  Neck: Neck supple. No thyromegaly present.  Cardiovascular: Normal rate, regular rhythm and normal heart sounds.   No murmur heard. Pulmonary/Chest: Effort normal and breath sounds normal. No respiratory distress.  Abdominal: He exhibits no distension and  no mass. There is no tenderness.  Musculoskeletal: He exhibits no edema.  Neurological: He is alert and oriented to person, place, and time.  Skin: Skin is warm.  Psychiatric: Memory, affect and judgment normal.    Lab Results  Component Value Date   TSH 1.45 02/28/2011   Lab Results  Component Value Date   WBC 5.5 02/28/2011   HGB 14.9 02/28/2011   HCT 42.9 02/28/2011   MCV 88.4 02/28/2011   PLT 239.0 02/28/2011   Lab Results  Component Value Date   CREATININE 0.7 02/28/2011   BUN 20 02/28/2011   NA 138 02/28/2011   K 4.4 02/28/2011   CL 107 02/28/2011   CO2 24 02/28/2011   Lab Results  Component Value Date   ALT 28 02/28/2011   AST 25 02/28/2011   ALKPHOS 38* 02/28/2011   BILITOT 0.9 02/28/2011   Lab Results  Component Value Date   CHOL 166 02/28/2011   Lab Results  Component Value Date   HDL 42.20 02/28/2011   Lab Results  Component Value Date   LDLCALC 98 02/28/2011   Lab Results  Component Value Date   TRIG 131.0 02/28/2011   Lab Results  Component Value Date   CHOLHDL 4 02/28/2011     Assessment & Plan  SLEEP APNEA, OBSTRUCTIVE Patient interested in a dental appliance to treat his sleep apnea. Will have supplied the patient with names of some local dentists that can perform the evaluation and sizing of this device.  Neck pain, acute Patient directly to the history of right neck pain. Over the weekend he was bending over and had a tightening in the right side of his neck which is persistent. He denies any trauma. This morning he woke up with the muscle on the right side with a stent to the point where it's difficult to turn his head. Reports he's had similar milder episodes in the past. Organisms on the arms, headache or other concerning associated symptoms. Will treat for spasm noted in his Sternocleidomastoid muscle with moist heat and gentle stretching and his instructed to use th Flexeril sparingly and his Naproxen bid with food for ht next week and report if  symptoms do not improve

## 2011-03-12 NOTE — Assessment & Plan Note (Addendum)
Patient interested in a dental appliance to treat his sleep apnea. Will have supplied the patient with names of some local dentists that can perform the evaluation and sizing of this device.

## 2011-03-13 ENCOUNTER — Encounter: Payer: Self-pay | Admitting: Family Medicine

## 2011-04-07 ENCOUNTER — Other Ambulatory Visit: Payer: Self-pay | Admitting: Family Medicine

## 2011-05-07 ENCOUNTER — Other Ambulatory Visit: Payer: Self-pay | Admitting: Family Medicine

## 2011-05-22 ENCOUNTER — Encounter: Payer: Self-pay | Admitting: Family Medicine

## 2011-05-29 ENCOUNTER — Ambulatory Visit (INDEPENDENT_AMBULATORY_CARE_PROVIDER_SITE_OTHER): Payer: Managed Care, Other (non HMO) | Admitting: Family Medicine

## 2011-05-29 ENCOUNTER — Encounter: Payer: Self-pay | Admitting: Family Medicine

## 2011-05-29 VITALS — BP 114/73 | HR 53 | Temp 97.7°F | Ht 68.5 in | Wt 241.8 lb

## 2011-05-29 DIAGNOSIS — E039 Hypothyroidism, unspecified: Secondary | ICD-10-CM

## 2011-05-29 DIAGNOSIS — J309 Allergic rhinitis, unspecified: Secondary | ICD-10-CM

## 2011-05-29 DIAGNOSIS — M542 Cervicalgia: Secondary | ICD-10-CM

## 2011-05-29 DIAGNOSIS — G4733 Obstructive sleep apnea (adult) (pediatric): Secondary | ICD-10-CM

## 2011-05-29 DIAGNOSIS — E785 Hyperlipidemia, unspecified: Secondary | ICD-10-CM

## 2011-05-29 MED ORDER — ATORVASTATIN CALCIUM 20 MG PO TABS: ORAL_TABLET | ORAL | Status: DC

## 2011-05-29 NOTE — Assessment & Plan Note (Signed)
Patient using Loratadine prn with good results

## 2011-05-29 NOTE — Assessment & Plan Note (Signed)
Improved with moist heat and gentle stretching, now uses Flexeril and Naproxen prn and infrequently for any recurrent pain or when he over exerts himself. May continue the same

## 2011-05-29 NOTE — Progress Notes (Signed)
Dustin Spears [id number] [birth year] 05/29/2011      Progress Note-Follow Up  Subjective  Chief Complaint  Chief Complaint  Patient presents with  . Follow-up    3 month follow up    HPI  Patient is a 45 yo caucasian male who is in today for follow up. He is doing well. His neck and back pain has improved. He has been using Flexeril and Naproxen prn when he overdose it or if pain flares a bit. He is also using moist heat and gentle stretching. No severe pain. He is tolerating his Lipitor and taking it more regularly now. Is also taking fish oil. No recent illness, fevers, chills, CP/palp/SOB/GI or GU c/o. His allergies are wlel controlled on occasional Loratadine. He has not been able to find anyone to fit him with an oral dental appliance to manage his sleep apnea.  Past Medical History  Diagnosis Date  . Hyperlipidemia   . Allergy     seasonal  . Thyroid disease   . Overweight 02/26/2011  . Knee pain, right   . History of chicken pox   . SNORING 03/12/2010  . SLEEP APNEA, OBSTRUCTIVE 04/24/2010  . ALLERGIC RHINITIS 03/12/2010  . FATIGUE 03/12/2010  . Headache 03/12/2010  . Neck pain, acute 03/12/2011    Past Surgical History  Procedure Date  . Bicep tendon in left arm 2000    Family History  Problem Relation Age of Onset  . Hyperlipidemia Mother   . Alcohol abuse Father     smoked  . Arthritis Father   . Lupus Paternal Grandmother   . Arthritis Maternal Grandmother   . Ulcers Maternal Grandmother   . Alzheimer's disease Maternal Grandfather     History   Social History  . Marital Status: Married    Spouse Name: N/A    Number of Children: 1  . Years of Education: N/A   Occupational History  . 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 [employer]    Social History Main Topics  . Smoking status: Never Smoker   . Smokeless tobacco: Never Used  . Alcohol Use: Yes     occasional  . Drug Use: No  . Sexually Active: Yes   Other Topics Concern  . Not on file   Social History  Narrative   Grew up in [place] to [employer] 1990    Current Outpatient Prescriptions on File Prior to Visit  Medication Sig Dispense Refill  . cyclobenzaprine (FLEXERIL) 10 MG tablet 1/2 to 1 tab po 3 x daily as needed for pain, may cause sedation  40 tablet  1  . fish oil-omega-3 fatty acids 1000 MG capsule Take 1 g by mouth daily.        Marland Kitchen glucosamine-chondroitin 500-400 MG tablet Take 1 tablet by mouth daily.        Marland Kitchen levothyroxine (SYNTHROID, LEVOTHROID) 50 MCG tablet TAKE 1 TABLET BY MOUTH EVERY DAY  30 tablet  0  . loratadine (CLARITIN) 10 MG tablet Take 10 mg by mouth daily.        . multivitamin (THERAGRAN) tablet Take 1 tablet by mouth daily.        . naproxen (NAPROSYN) 500 MG tablet Take 1 tablet (500 mg total) by mouth 2 (two) times daily as needed (pain). With meals  60 tablet  2    No Known Allergies  Review of Systems  Review of Systems  Constitutional: Positive for malaise/fatigue. Negative for fever.  HENT: Negative for congestion.  Eyes: Negative for discharge.  Respiratory: Negative for shortness of breath.   Cardiovascular: Negative for chest pain, palpitations and leg swelling.  Gastrointestinal: Negative for nausea, abdominal pain and diarrhea.  Genitourinary: Negative for dysuria.  Musculoskeletal: Negative for falls.  Skin: Negative for rash.  Neurological: Negative for loss of consciousness and headaches.  Endo/Heme/Allergies: Negative for polydipsia.  Psychiatric/Behavioral: Negative for depression and suicidal ideas. The patient is not nervous/anxious and does not have insomnia.     Objective  BP 114/73  Pulse 53  Temp(Src) 97.7 F (36.5 C) (Oral)  Ht 5' 8.5" (1.74 m)  Wt 241 lb 12.8 oz (109.68 kg)  BMI 36.23 kg/m2  SpO2 99%  Physical Exam  Physical Exam  Constitutional: He is oriented to person, place, and time and well-developed, well-nourished, and in no distress. No distress.  HENT:  Head: Normocephalic and atraumatic.  Eyes:  Conjunctivae are normal.  Neck: Neck supple. No thyromegaly present.  Cardiovascular: Normal rate, regular rhythm and normal heart sounds.   No murmur heard. Pulmonary/Chest: Effort normal and breath sounds normal. No respiratory distress.  Abdominal: He exhibits no distension and no mass. There is no tenderness.  Musculoskeletal: He exhibits no edema.  Neurological: He is alert and oriented to person, place, and time.  Skin: Skin is warm.  Psychiatric: Memory, affect and judgment normal.    Lab Results  Component Value Date   TSH 1.45 02/28/2011   Lab Results  Component Value Date   WBC 5.5 02/28/2011   HGB 14.9 02/28/2011   HCT 42.9 02/28/2011   MCV 88.4 02/28/2011   PLT 239.0 02/28/2011   Lab Results  Component Value Date   CREATININE 0.7 02/28/2011   BUN 20 02/28/2011   NA 138 02/28/2011   K 4.4 02/28/2011   CL 107 02/28/2011   CO2 24 02/28/2011   Lab Results  Component Value Date   ALT 28 02/28/2011   AST 25 02/28/2011   ALKPHOS 38* 02/28/2011   BILITOT 0.9 02/28/2011   Lab Results  Component Value Date   CHOL 166 02/28/2011   Lab Results  Component Value Date   HDL 42.20 02/28/2011   Lab Results  Component Value Date   LDLCALC 98 02/28/2011   Lab Results  Component Value Date   TRIG 131.0 02/28/2011   Lab Results  Component Value Date   CHOLHDL 4 02/28/2011     Assessment & Plan  Neck pain, acute Improved with moist heat and gentle stretching, now uses Flexeril and Naproxen prn and infrequently for any recurrent pain or when he over exerts himself. May continue the same  SLEEP APNEA, OBSTRUCTIVE Is interested in an oral appliance to manage his sleep apnea. Will continue to pursue  HYPOTHYROIDISM Well treated will continue to current dose and monitor  Hyperlipidemia Cholesterol greatly improved on Lipitor, he has tried to correct his diet and avoid trans fats, he will continue to take fish oil and we will try Lipitor at just 10mg  and recheck panel in 4  months.  ALLERGIC RHINITIS Patient using Loratadine prn with good results

## 2011-05-29 NOTE — Assessment & Plan Note (Signed)
Cholesterol greatly improved on Lipitor, he has tried to correct his diet and avoid trans fats, he will continue to take fish oil and we will try Lipitor at just 10mg  and recheck panel in 4 months.

## 2011-05-29 NOTE — Patient Instructions (Signed)

## 2011-05-29 NOTE — Assessment & Plan Note (Signed)
Well treated will continue to current dose and monitor

## 2011-05-29 NOTE — Assessment & Plan Note (Signed)
Is interested in an oral appliance to manage his sleep apnea. Will continue to pursue

## 2011-06-07 ENCOUNTER — Other Ambulatory Visit: Payer: Self-pay | Admitting: Family Medicine

## 2011-07-02 ENCOUNTER — Telehealth: Payer: Self-pay

## 2011-07-02 NOTE — Telephone Encounter (Signed)
Opened on accident

## 2011-07-07 ENCOUNTER — Other Ambulatory Visit: Payer: Self-pay | Admitting: Family Medicine

## 2011-08-06 ENCOUNTER — Other Ambulatory Visit: Payer: Self-pay | Admitting: Family Medicine

## 2011-09-01 ENCOUNTER — Other Ambulatory Visit: Payer: Self-pay | Admitting: Family Medicine

## 2011-09-05 ENCOUNTER — Telehealth: Payer: Self-pay

## 2011-09-05 NOTE — Telephone Encounter (Signed)
Patient's spouse states that patient is ready to get the CPAP machine.

## 2011-09-05 NOTE — Telephone Encounter (Signed)
So I need to know more about when and where he did his sleep study, he saw Dr Artist Pais before but I cannot readily find the study, if it is older than a year we may need to repeat study but we can do it at home

## 2011-09-08 ENCOUNTER — Telehealth: Payer: Self-pay

## 2011-09-08 NOTE — Telephone Encounter (Signed)
Patients spouse informed and is going to try to get the sleep study report to [place]

## 2011-09-08 NOTE — Telephone Encounter (Signed)
pts wife left a message stating that patient said md pulled up the sleep study report during his appt? He can't remember the name of the office he went to? I printed sleep study report and put on your desk.

## 2011-09-08 NOTE — Telephone Encounter (Signed)
Left a message for patients spouse to call me back

## 2011-09-09 NOTE — Telephone Encounter (Signed)
Diane called [facility] and filled out paperwork and put on MD's desk

## 2011-09-10 NOTE — Telephone Encounter (Signed)
Excellent, completed

## 2011-09-15 ENCOUNTER — Telehealth: Payer: Self-pay

## 2011-09-15 NOTE — Telephone Encounter (Signed)
Informed patients spouse that someone will contact her or Demoni when they get done with the referral

## 2011-09-15 NOTE — Telephone Encounter (Signed)
Pts spouse left a message stating she would like to know what is going on with her husbands CPAP? I left a message for patient to return my call. Dr Abner Greenspan has done a referral for [facility] to contact him.

## 2011-09-23 ENCOUNTER — Other Ambulatory Visit: Payer: Managed Care, Other (non HMO)

## 2011-09-25 ENCOUNTER — Other Ambulatory Visit (INDEPENDENT_AMBULATORY_CARE_PROVIDER_SITE_OTHER): Payer: Managed Care, Other (non HMO)

## 2011-09-25 DIAGNOSIS — E785 Hyperlipidemia, unspecified: Secondary | ICD-10-CM

## 2011-09-25 DIAGNOSIS — E039 Hypothyroidism, unspecified: Secondary | ICD-10-CM

## 2011-09-25 LAB — LIPID PANEL
HDL: 46.8 mg/dL (ref >39.00)
Triglycerides: 114 mg/dL (ref 0.0–149.0)

## 2011-09-25 LAB — CBC
MCHC: 33.1 g/dL (ref 30.0–36.0)
MCV: 90 fl (ref 78.0–100.0)
RDW: 13.1 % (ref 11.5–14.6)

## 2011-09-25 LAB — RENAL FUNCTION PANEL
BUN: 16 mg/dL (ref 6–23)
Creatinine, Ser: 0.8 mg/dL (ref 0.4–1.5)
GFR: 108.74 mL/min (ref >60.00)
Glucose, Bld: 99 mg/dL (ref 70–99)
Phosphorus: 3.5 mg/dL (ref 2.3–4.6)
Sodium: 137 mEq/L (ref 135–145)

## 2011-09-25 LAB — HEPATIC FUNCTION PANEL
Albumin: 4.3 g/dL (ref 3.5–5.2)
Alkaline Phosphatase: 38 U/L — ABNORMAL LOW (ref 39–117)
Total Protein: 7.3 g/dL (ref 6.0–8.3)

## 2011-09-25 LAB — TSH: TSH: 1.04 u[IU]/mL (ref 0.35–5.50)

## 2011-09-29 ENCOUNTER — Ambulatory Visit: Payer: Managed Care, Other (non HMO) | Admitting: Family Medicine

## 2011-10-03 ENCOUNTER — Ambulatory Visit (INDEPENDENT_AMBULATORY_CARE_PROVIDER_SITE_OTHER): Payer: Managed Care, Other (non HMO) | Admitting: Family Medicine

## 2011-10-03 ENCOUNTER — Encounter: Payer: Self-pay | Admitting: Family Medicine

## 2011-10-03 DIAGNOSIS — R5381 Other malaise: Secondary | ICD-10-CM

## 2011-10-03 DIAGNOSIS — R5383 Other fatigue: Secondary | ICD-10-CM

## 2011-10-03 DIAGNOSIS — E785 Hyperlipidemia, unspecified: Secondary | ICD-10-CM

## 2011-10-03 DIAGNOSIS — E039 Hypothyroidism, unspecified: Secondary | ICD-10-CM

## 2011-10-03 MED ORDER — BENEFIBER PO POWD: ORAL | Status: AC

## 2011-10-03 MED ORDER — KRILL OIL PO CAPS: 1.0000 | ORAL_CAPSULE | Freq: Every day | ORAL | Status: DC

## 2011-10-03 MED ORDER — CALCIUM CITRATE-VITAMIN D 200-250 MG-UNIT PO TABS: 1.0000 | ORAL_TABLET | Freq: Every day | ORAL | Status: DC

## 2011-10-03 NOTE — Patient Instructions (Signed)

## 2011-10-06 ENCOUNTER — Other Ambulatory Visit: Payer: Self-pay | Admitting: Family Medicine

## 2011-10-06 NOTE — Progress Notes (Signed)
Patient ID: Glennie Rodda, male   DOB: 12-13-1965, 45 y.o.   MRN: [id number] Kevante Lunt [id number] 09/23/1966 10/06/2011      Progress Note-Follow Up  Subjective  Chief Complaint  Chief Complaint  Patient presents with  . Follow-up    HPI  Physical Exam  Constitutional: He is oriented to person, place, and time and well-developed, well-nourished, and in no distress. No distress.  HENT:  Head: Normocephalic and atraumatic.  Eyes: Conjunctivae are normal.  Neck: Neck supple. No thyromegaly present.  Cardiovascular: Normal rate, regular rhythm and normal heart sounds.   No murmur heard. Pulmonary/Chest: Effort normal and breath sounds normal. No respiratory distress.  Abdominal: He exhibits no distension and no mass. There is no tenderness.  Musculoskeletal: He exhibits no edema.  Neurological: He is alert and oriented to person, place, and time.  Skin: Skin is warm.  Psychiatric: Memory, affect and judgment normal.    Past Medical History  Diagnosis Date  . Hyperlipidemia   . Allergy     seasonal  . Thyroid disease   . Overweight 02/26/2011  . Knee pain, right   . History of chicken pox   . SNORING 03/12/2010  . SLEEP APNEA, OBSTRUCTIVE 04/24/2010  . ALLERGIC RHINITIS 03/12/2010  . FATIGUE 03/12/2010  . Headache 03/12/2010  . Neck pain, acute 03/12/2011    Past Surgical History  Procedure Date  . Bicep tendon in left arm 2000    Family History  Problem Relation Age of Onset  . Hyperlipidemia Mother   . Alcohol abuse Father     smoked  . Arthritis Father   . Lupus Paternal Grandmother   . Arthritis Maternal Grandmother   . Ulcers Maternal Grandmother   . Alzheimer's disease Maternal Grandfather     History   Social History  . Marital Status: Married    Spouse Name: N/A    Number of Children: 1  . Years of Education: N/A   Occupational History  . 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 [employer]    Social History Main Topics  . Smoking status: Never Smoker   .  Smokeless tobacco: Never Used  . Alcohol Use: Yes     occasional  . Drug Use: No  . Sexually Active: Yes   Other Topics Concern  . Not on file   Social History Narrative   Grew up in [place] to [employer] 1990    Current Outpatient Prescriptions on File Prior to Visit  Medication Sig Dispense Refill  . atorvastatin (LIPITOR) 20 MG tablet 1/2 tab po qhs  30 tablet  0  . glucosamine-chondroitin 500-400 MG tablet Take 1 tablet by mouth daily.        . multivitamin (THERAGRAN) tablet Take 1 tablet by mouth daily.        . naproxen (NAPROSYN) 500 MG tablet Take 1 tablet (500 mg total) by mouth 2 (two) times daily as needed (pain). With meals  60 tablet  2    No Known Allergies  Review of Systems Review of Systems  Constitutional: Negative for fever and malaise/fatigue.  HENT: Negative for congestion.   Eyes: Negative for discharge.  Respiratory: Negative for shortness of breath.   Cardiovascular: Negative for chest pain, palpitations and leg swelling.  Gastrointestinal: Negative for nausea, abdominal pain and diarrhea.  Genitourinary: Negative for dysuria.  Musculoskeletal: Negative for falls.  Skin: Negative for rash.  Neurological: Negative for loss of consciousness and headaches.  Endo/Heme/Allergies: Negative for polydipsia.  Psychiatric/Behavioral: Negative  for depression and suicidal ideas. The patient is not nervous/anxious and does not have insomnia.     Objective  BP 119/90  Pulse 55  Temp 98.5 F (36.9 C)  Ht 5' 8.5" (1.74 m)  Wt 245 lb (111.131 kg)  BMI 36.71 kg/m2  SpO2 100%  Physical Exam  Patient is a 45 year old Caucasian male who is in today for followup on his new patient appointment. Overall good is doing well except that he has persistent fatigue. He is using his CPAP machine routinely and still weak and very tired. He is concerned that his statins may be contributing to his fatigue. He made no recent changes that are significant his diet lifestyle or  exercise that would explain his increasing fatigue.  Lab Results  Component Value Date   TSH 1.04 09/25/2011   Lab Results  Component Value Date   WBC 5.9 09/25/2011   HGB 14.7 09/25/2011   HCT 44.3 09/25/2011   MCV 90.0 09/25/2011   PLT 219.0 09/25/2011   Lab Results  Component Value Date   CREATININE 0.8 09/25/2011   BUN 16 09/25/2011   NA 137 09/25/2011   K 4.1 09/25/2011   CL 107 09/25/2011   CO2 23 09/25/2011   Lab Results  Component Value Date   ALT 32 09/25/2011   AST 29 09/25/2011   ALKPHOS 38* 09/25/2011   BILITOT 0.6 09/25/2011   Lab Results  Component Value Date   CHOL 176 09/25/2011   Lab Results  Component Value Date   HDL 46.80 09/25/2011   Lab Results  Component Value Date   LDLCALC 106* 09/25/2011   Lab Results  Component Value Date   TRIG 114.0 09/25/2011   Lab Results  Component Value Date   CHOLHDL 4 09/25/2011     Assessment & Plan  FATIGUE Continues to struggle despite the risk for sleep apnea. Also describes a decreased libido. Check a testosterone level at next visit. Encouraged healthy diet with small balance meals plenty of rest plenty of fluids and increased exercise  HYPOTHYROIDISM Adequately treated with TSH wnl  Hyperlipidemia Issues concerning his fatigue may be speaking to be 2 by statins. He'll try and make dietary changes and avoid transplant. He is to make her Reglan Daily we will hold Lipitor 2 months ago.

## 2011-10-06 NOTE — Assessment & Plan Note (Signed)
Issues concerning his fatigue may be speaking to be 2 by statins. He'll try and make dietary changes and avoid transplant. He is to make her Reglan Daily we will hold Lipitor 2 months ago.

## 2011-10-06 NOTE — Assessment & Plan Note (Signed)
Continues to struggle despite the risk for sleep apnea. Also describes a decreased libido. Check a testosterone level at next visit. Encouraged healthy diet with small balance meals plenty of rest plenty of fluids and increased exercise

## 2011-10-06 NOTE — Assessment & Plan Note (Signed)
Adequately treated with TSH wnl

## 2011-10-07 LAB — TESTOSTERONE: Testosterone: 231.08 ng/dL — ABNORMAL LOW (ref 350.00–890.00)

## 2011-10-10 ENCOUNTER — Ambulatory Visit: Payer: Managed Care, Other (non HMO) | Admitting: Family Medicine

## 2011-10-17 ENCOUNTER — Encounter: Payer: Self-pay | Admitting: Family Medicine

## 2011-10-17 ENCOUNTER — Ambulatory Visit (INDEPENDENT_AMBULATORY_CARE_PROVIDER_SITE_OTHER): Payer: Managed Care, Other (non HMO) | Admitting: Family Medicine

## 2011-10-17 VITALS — BP 116/77 | HR 54 | Temp 98.1°F | Ht 68.5 in | Wt 247.8 lb

## 2011-10-17 DIAGNOSIS — G4733 Obstructive sleep apnea (adult) (pediatric): Secondary | ICD-10-CM

## 2011-10-17 DIAGNOSIS — E785 Hyperlipidemia, unspecified: Secondary | ICD-10-CM

## 2011-10-17 DIAGNOSIS — R5383 Other fatigue: Secondary | ICD-10-CM

## 2011-10-17 DIAGNOSIS — E039 Hypothyroidism, unspecified: Secondary | ICD-10-CM

## 2011-10-17 DIAGNOSIS — R5381 Other malaise: Secondary | ICD-10-CM

## 2011-10-17 DIAGNOSIS — E349 Endocrine disorder, unspecified: Secondary | ICD-10-CM

## 2011-10-17 DIAGNOSIS — E291 Testicular hypofunction: Secondary | ICD-10-CM

## 2011-10-17 MED ORDER — TESTOSTERONE CYPIONATE 200 MG/ML IM SOLN
100.0000 mg | INTRAMUSCULAR | Status: DC
  Administered 2011-10-17: 100 mg via INTRAMUSCULAR

## 2011-10-17 MED ORDER — TESTOSTERONE CYPIONATE 100 MG/ML IM SOLN: 100.0000 mg | INTRAMUSCULAR | Status: DC

## 2011-10-17 MED ORDER — LEVOTHYROXINE SODIUM 50 MCG PO TABS: 50.0000 ug | ORAL_TABLET | Freq: Every day | ORAL | Status: DC

## 2011-10-17 NOTE — Patient Instructions (Signed)
Testosterone injection What is this medicine? TESTOSTERONE (tes TOS ter one) is the main male hormone. It supports normal male development such as muscle growth, facial hair, and deep voice. It is used in males to treat low testosterone levels. This medicine may be used for other purposes; ask your health care provider or pharmacist if you have questions. What should I tell my health care provider before I take this medicine? They need to know if you have any of these conditions: -breast cancer -diabetes -heart disease -kidney disease -liver disease -lung disease -prostate cancer, enlargement -an unusual or allergic reaction to testosterone, other medicines, foods, dyes, or preservatives -pregnant or trying to get pregnant -breast-feeding How should I use this medicine? This medicine is for injection into a muscle. It is usually given by a health care professional in a hospital or clinic setting. Contact your pediatrician regarding the use of this medicine in children. While this medicine may be prescribed for children as young as 12 years of age for selected conditions, precautions do apply. Overdosage: If you think you have taken too much of this medicine contact a poison control center or emergency room at once. NOTE: This medicine is only for you. Do not share this medicine with others. What if I miss a dose? Try not to miss a dose. Your doctor or health care professional will tell you when your next injection is due. Notify the office if you are unable to keep an appointment. What may interact with this medicine? -medicines for diabetes -medicines that treat or prevent blood clots like warfarin -oxyphenbutazone -propranolol -steroid medicines like prednisone or cortisone This list may not describe all possible interactions. Give your health care provider a list of all the medicines, herbs, non-prescription drugs, or dietary supplements you use. Also tell them if you smoke, drink  alcohol, or use illegal drugs. Some items may interact with your medicine. What should I watch for while using this medicine? Visit your doctor or health care professional for regular checks on your progress. They will need to check the level of testosterone in your blood. This medicine may affect blood sugar levels. If you have diabetes, check with your doctor or health care professional before you change your diet or the dose of your diabetic medicine. This drug is banned from use in athletes by most athletic organizations. What side effects may I notice from receiving this medicine? Side effects that you should report to your doctor or health care professional as soon as possible: -allergic reactions like skin rash, itching or hives, swelling of the face, lips, or tongue -breast enlargement -breathing problems -changes in mood, especially anger, depression, or rage -dark urine -general ill feeling or flu-like symptoms -light-colored stools -loss of appetite, nausea -nausea, vomiting -right upper belly pain -stomach pain -swelling of ankles -too frequent or persistent erections -trouble passing urine or change in the amount of urine -unusually weak or tired -yellowing of the eyes or skin Additional side effects that can occur in women include: -deep or hoarse voice -facial hair growth -irregular menstrual periods Side effects that usually do not require medical attention (report to your doctor or health care professional if they continue or are bothersome): -acne -change in sex drive or performance -hair loss -headache This list may not describe all possible side effects. Call your doctor for medical advice about side effects. You may report side effects to FDA at 1-800-FDA-1088. Where should I keep my medicine? Keep out of the reach of children. This medicine   can be abused. Keep your medicine in a safe place to protect it from theft. Do not share this medicine with anyone.  Selling or giving away this medicine is dangerous and against the law. Store at room temperature between 20 and 25 degrees C (68 and 77 degrees F). Do not freeze. Protect from light. Follow the directions for the product you are prescribed. Throw away any unused medicine after the expiration date. NOTE: This sheet is a summary. It may not cover all possible information. If you have questions about this medicine, talk to your doctor, pharmacist, or health care provider.  2012, [employer]/Gold Standard. (12/17/2007 4:13:46 PM) 

## 2011-10-19 ENCOUNTER — Encounter: Payer: Self-pay | Admitting: Family Medicine

## 2011-10-19 DIAGNOSIS — E349 Endocrine disorder, unspecified: Secondary | ICD-10-CM | POA: Insufficient documentation

## 2011-10-19 HISTORY — DX: Endocrine disorder, unspecified: E34.9

## 2011-10-19 NOTE — Assessment & Plan Note (Signed)
He does believe his fatigue has improved some since we stopped the Atorvastatin, will repeat lipid panel with next blood draw to [facility]

## 2011-10-19 NOTE — Assessment & Plan Note (Signed)
Multifactorial, improving but still significant, will continue to manage

## 2011-10-19 NOTE — Progress Notes (Signed)
Patient ID: Dustin Spears, male   DOB: 05-05-1966, 45 y.o.   MRN: [id number] Dustin Spears [id number] [birth year] 10/19/2011      Progress Note-Follow Up  Subjective  Chief Complaint  Chief Complaint  Patient presents with  . Follow-up    on testosterone    HPI  Patient is a 45 year old Caucasian male who is in today for followup of multiple medical problems. Labs at his last visit revealed low testosterone. We will initiate testosterone injections today. He continues to struggle with fatigue but does believe it is somewhat improved since he stopped his atorvastatin. He offers no other acute complaints today. Continues to use his CPAP machine routinely. Allergies are generally controlled at this time. Denies chest pain, palpitations, shortness of breath, GI or GU complaints at this time.  Past Medical History  Diagnosis Date  . Hyperlipidemia   . Allergy     seasonal  . Thyroid disease   . Overweight 02/26/2011  . Knee pain, right   . History of chicken pox   . SNORING 03/12/2010  . SLEEP APNEA, OBSTRUCTIVE 04/24/2010  . ALLERGIC RHINITIS 03/12/2010  . FATIGUE 03/12/2010  . Headache 03/12/2010  . Neck pain, acute 03/12/2011  . Testosterone deficiency 10/19/2011    Past Surgical History  Procedure Date  . Bicep tendon in left arm 2000    Family History  Problem Relation Age of Onset  . Hyperlipidemia Mother   . Alcohol abuse Father     smoked  . Arthritis Father   . Lupus Paternal Grandmother   . Arthritis Maternal Grandmother   . Ulcers Maternal Grandmother   . Alzheimer's disease Maternal Grandfather     History   Social History  . Marital Status: Married    Spouse Name: N/A    Number of Children: 1  . Years of Education: N/A   Occupational History  . 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 [employer]    Social History Main Topics  . Smoking status: Never Smoker   . Smokeless tobacco: Never Used  . Alcohol Use: Yes     occasional  . Drug Use: No  . Sexually Active: Yes    Other Topics Concern  . Not on file   Social History Narrative   Grew up in [place] to [employer] 1990    Current Outpatient Prescriptions on File Prior to Visit  Medication Sig Dispense Refill  . Calcium Citrate-Vitamin D 200-250 MG-UNIT TABS Take 1 capsule by mouth daily.  120 tablet  0  . glucosamine-chondroitin 500-400 MG tablet Take 1 tablet by mouth daily.        [place] Lanius CAPS Take 1 capsule by mouth daily.  30 capsule  0  . multivitamin (THERAGRAN) tablet Take 1 tablet by mouth daily.        . naproxen (NAPROSYN) 500 MG tablet Take 1 tablet (500 mg total) by mouth 2 (two) times daily as needed (pain). With meals  60 tablet  2  . Wheat Dextrin (BENEFIBER) POWD 2 tsp po daily in liquids  730 g  0   No current facility-administered medications on file prior to visit.    No Known Allergies  Review of Systems  Review of Systems  Constitutional: Positive for malaise/fatigue. Negative for fever.  HENT: Negative for congestion.   Eyes: Negative for discharge.  Respiratory: Negative for shortness of breath.   Cardiovascular: Negative for chest pain, palpitations and leg swelling.  Gastrointestinal: Negative for nausea, abdominal pain and diarrhea.  Genitourinary: Negative for dysuria.  Musculoskeletal: Negative for falls.  Skin: Negative for rash.  Neurological: Negative for loss of consciousness and headaches.  Endo/Heme/Allergies: Negative for polydipsia.  Psychiatric/Behavioral: Negative for depression and suicidal ideas. The patient is not nervous/anxious and does not have insomnia.     Objective  BP 116/77  Pulse 54  Temp(Src) 98.1 F (36.7 C) (Temporal)  Ht 5' 8.5" (1.74 m)  Wt 247 lb 12.8 oz (112.401 kg)  BMI 37.13 kg/m2  SpO2 97%  Physical Exam  Physical Exam  Constitutional: He is oriented to person, place, and time and well-developed, well-nourished, and in no distress. No distress.  HENT:  Head: Normocephalic and atraumatic.  Eyes: Conjunctivae are  normal.  Neck: Neck supple. No thyromegaly present.  Cardiovascular: Normal rate, regular rhythm and normal heart sounds.   No murmur heard. Pulmonary/Chest: Effort normal and breath sounds normal. No respiratory distress.  Abdominal: He exhibits no distension and no mass. There is no tenderness.  Musculoskeletal: He exhibits no edema.  Neurological: He is alert and oriented to person, place, and time.  Skin: Skin is warm.  Psychiatric: Memory, affect and judgment normal.    Lab Results  Component Value Date   TSH 1.04 09/25/2011   Lab Results  Component Value Date   WBC 5.9 09/25/2011   HGB 14.7 09/25/2011   HCT 44.3 09/25/2011   MCV 90.0 09/25/2011   PLT 219.0 09/25/2011   Lab Results  Component Value Date   CREATININE 0.8 09/25/2011   BUN 16 09/25/2011   NA 137 09/25/2011   K 4.1 09/25/2011   CL 107 09/25/2011   CO2 23 09/25/2011   Lab Results  Component Value Date   ALT 32 09/25/2011   AST 29 09/25/2011   ALKPHOS 38* 09/25/2011   BILITOT 0.6 09/25/2011   Lab Results  Component Value Date   CHOL 176 09/25/2011   Lab Results  Component Value Date   HDL 46.80 09/25/2011   Lab Results  Component Value Date   LDLCALC 106* 09/25/2011   Lab Results  Component Value Date   TRIG 114.0 09/25/2011   Lab Results  Component Value Date   CHOLHDL 4 09/25/2011     Assessment & Plan  Testosterone deficiency Agrees to treatment due to his symptomatic condition, will start injections 100mg  every other week and reassess in 8-10 weeks or as needed. Warned regarding possible side effects.  HYPOTHYROIDISM Check a tsh with next blood draw no change in dosing today  Hyperlipidemia He does believe his fatigue has improved some since we stopped the Atorvastatin, will repeat lipid panel with next blood draw to [facility]  FATIGUE Multifactorial, improving but still significant, will continue to manage  SLEEP APNEA, OBSTRUCTIVE Using CPAP daily

## 2011-10-19 NOTE — Assessment & Plan Note (Signed)
Check a tsh with next blood draw no change in dosing today

## 2011-10-19 NOTE — Assessment & Plan Note (Signed)
Agrees to treatment due to his symptomatic condition, will start injections 100mg  every other week and reassess in 8-10 weeks or as needed. Warned regarding possible side effects.

## 2011-10-19 NOTE — Assessment & Plan Note (Signed)
Using CPAP daily 

## 2011-10-31 ENCOUNTER — Ambulatory Visit (INDEPENDENT_AMBULATORY_CARE_PROVIDER_SITE_OTHER): Payer: Managed Care, Other (non HMO)

## 2011-10-31 DIAGNOSIS — E349 Endocrine disorder, unspecified: Secondary | ICD-10-CM

## 2011-10-31 DIAGNOSIS — E291 Testicular hypofunction: Secondary | ICD-10-CM

## 2011-10-31 MED ORDER — TESTOSTERONE CYPIONATE 200 MG/ML IM SOLN
100.0000 mg | INTRAMUSCULAR | Status: DC
  Administered 2011-10-31: 100 mg via INTRAMUSCULAR

## 2011-11-04 ENCOUNTER — Other Ambulatory Visit: Payer: Self-pay | Admitting: Family Medicine

## 2011-11-14 ENCOUNTER — Ambulatory Visit: Payer: Managed Care, Other (non HMO)

## 2011-11-17 ENCOUNTER — Ambulatory Visit (INDEPENDENT_AMBULATORY_CARE_PROVIDER_SITE_OTHER): Payer: Managed Care, Other (non HMO)

## 2011-11-17 DIAGNOSIS — E349 Endocrine disorder, unspecified: Secondary | ICD-10-CM

## 2011-11-17 DIAGNOSIS — E291 Testicular hypofunction: Secondary | ICD-10-CM

## 2011-11-17 MED ORDER — TESTOSTERONE CYPIONATE 200 MG/ML IM SOLN
100.0000 mg | INTRAMUSCULAR | Status: DC
  Administered 2011-11-17: 100 mg via INTRAMUSCULAR

## 2011-11-28 ENCOUNTER — Ambulatory Visit (INDEPENDENT_AMBULATORY_CARE_PROVIDER_SITE_OTHER): Payer: Managed Care, Other (non HMO)

## 2011-11-28 DIAGNOSIS — E349 Endocrine disorder, unspecified: Secondary | ICD-10-CM

## 2011-11-28 DIAGNOSIS — E291 Testicular hypofunction: Secondary | ICD-10-CM

## 2011-11-28 MED ORDER — TESTOSTERONE CYPIONATE 200 MG/ML IM SOLN
100.0000 mg | INTRAMUSCULAR | Status: DC
  Administered 2011-11-28: 100 mg via INTRAMUSCULAR

## 2011-12-08 ENCOUNTER — Other Ambulatory Visit: Payer: Self-pay | Admitting: Family Medicine

## 2011-12-08 NOTE — Telephone Encounter (Signed)
RX refilled x 1 year in 09/2011.  Per pharmacy RX is on hold.  She will process and delete refill request.

## 2011-12-12 ENCOUNTER — Ambulatory Visit (INDEPENDENT_AMBULATORY_CARE_PROVIDER_SITE_OTHER): Payer: Managed Care, Other (non HMO)

## 2011-12-12 DIAGNOSIS — E291 Testicular hypofunction: Secondary | ICD-10-CM

## 2011-12-12 MED ORDER — TESTOSTERONE CYPIONATE 200 MG/ML IM SOLN
100.0000 mg | INTRAMUSCULAR | Status: DC
  Administered 2011-12-12: 100 mg via INTRAMUSCULAR

## 2011-12-25 ENCOUNTER — Ambulatory Visit: Payer: Managed Care, Other (non HMO)

## 2011-12-25 ENCOUNTER — Other Ambulatory Visit: Payer: Managed Care, Other (non HMO)

## 2011-12-26 ENCOUNTER — Ambulatory Visit (INDEPENDENT_AMBULATORY_CARE_PROVIDER_SITE_OTHER): Payer: Managed Care, Other (non HMO)

## 2011-12-26 ENCOUNTER — Other Ambulatory Visit (INDEPENDENT_AMBULATORY_CARE_PROVIDER_SITE_OTHER): Payer: Managed Care, Other (non HMO)

## 2011-12-26 DIAGNOSIS — E039 Hypothyroidism, unspecified: Secondary | ICD-10-CM

## 2011-12-26 DIAGNOSIS — R5381 Other malaise: Secondary | ICD-10-CM

## 2011-12-26 DIAGNOSIS — E349 Endocrine disorder, unspecified: Secondary | ICD-10-CM

## 2011-12-26 DIAGNOSIS — E291 Testicular hypofunction: Secondary | ICD-10-CM

## 2011-12-26 DIAGNOSIS — E785 Hyperlipidemia, unspecified: Secondary | ICD-10-CM

## 2011-12-26 LAB — RENAL FUNCTION PANEL
BUN: 15 mg/dL (ref 6–23)
Chloride: 104 mEq/L (ref 96–112)
GFR: 107.1 mL/min (ref >60.00)
Phosphorus: 3.3 mg/dL (ref 2.3–4.6)
Sodium: 136 mEq/L (ref 135–145)

## 2011-12-26 LAB — HEPATIC FUNCTION PANEL
AST: 28 U/L (ref 0–37)
Alkaline Phosphatase: 33 U/L — ABNORMAL LOW (ref 39–117)
Bilirubin, Direct: 0 mg/dL (ref 0.0–0.3)
Total Bilirubin: 0.5 mg/dL (ref 0.3–1.2)

## 2011-12-26 LAB — CBC
MCHC: 33.6 g/dL (ref 30.0–36.0)
Platelets: 231 10*3/uL (ref 150.0–400.0)
RDW: 13.5 % (ref 11.5–14.6)
WBC: 6 10*3/uL (ref 4.5–10.5)

## 2011-12-26 LAB — LDL CHOLESTEROL, DIRECT: Direct LDL: 156.2 mg/dL

## 2011-12-26 LAB — LIPID PANEL
Cholesterol: 218 mg/dL — ABNORMAL HIGH (ref 0–200)
Total CHOL/HDL Ratio: 5
VLDL: 34 mg/dL (ref 0.0–40.0)

## 2011-12-26 LAB — TESTOSTERONE: Testosterone: 294.16 ng/dL — ABNORMAL LOW (ref 350.00–890.00)

## 2011-12-26 MED ORDER — TESTOSTERONE CYPIONATE 100 MG/ML IM SOLN: 100.0000 mg | INTRAMUSCULAR | Status: DC

## 2011-12-26 MED ORDER — TESTOSTERONE CYPIONATE 200 MG/ML IM SOLN
100.0000 mg | INTRAMUSCULAR | Status: DC
  Administered 2011-12-26: 100 mg via INTRAMUSCULAR

## 2011-12-30 MED ORDER — TESTOSTERONE CYPIONATE 100 MG/ML IM SOLN: 200.0000 mg | INTRAMUSCULAR | Status: DC

## 2012-01-02 ENCOUNTER — Ambulatory Visit: Payer: Managed Care, Other (non HMO) | Admitting: Family Medicine

## 2012-01-09 ENCOUNTER — Encounter: Payer: Self-pay | Admitting: Family Medicine

## 2012-01-09 ENCOUNTER — Ambulatory Visit (INDEPENDENT_AMBULATORY_CARE_PROVIDER_SITE_OTHER): Payer: Managed Care, Other (non HMO) | Admitting: Family Medicine

## 2012-01-09 VITALS — BP 112/73 | HR 74 | Temp 98.0°F | Ht 67.75 in | Wt 245.4 lb

## 2012-01-09 DIAGNOSIS — M25561 Pain in right knee: Secondary | ICD-10-CM

## 2012-01-09 DIAGNOSIS — E349 Endocrine disorder, unspecified: Secondary | ICD-10-CM

## 2012-01-09 DIAGNOSIS — E663 Overweight: Secondary | ICD-10-CM

## 2012-01-09 DIAGNOSIS — R5381 Other malaise: Secondary | ICD-10-CM

## 2012-01-09 DIAGNOSIS — G4733 Obstructive sleep apnea (adult) (pediatric): Secondary | ICD-10-CM

## 2012-01-09 DIAGNOSIS — M25569 Pain in unspecified knee: Secondary | ICD-10-CM

## 2012-01-09 DIAGNOSIS — E291 Testicular hypofunction: Secondary | ICD-10-CM

## 2012-01-09 DIAGNOSIS — R5383 Other fatigue: Secondary | ICD-10-CM

## 2012-01-09 MED ORDER — CALCIUM CITRATE-VITAMIN D 200-250 MG-UNIT PO TABS: 1.0000 | ORAL_TABLET | Freq: Every day | ORAL | Status: AC

## 2012-01-09 MED ORDER — TESTOSTERONE CYPIONATE 200 MG/ML IM SOLN: 300.0000 mg | INTRAMUSCULAR | Status: DC

## 2012-01-09 MED ORDER — TESTOSTERONE CYPIONATE 200 MG/ML IM SOLN
300.0000 mg | INTRAMUSCULAR | Status: AC
  Administered 2012-01-09: 300 mg via INTRAMUSCULAR

## 2012-01-09 NOTE — Patient Instructions (Signed)
Testosterone skin gel What is this medicine? TESTOSTERONE (tes TOS ter one) is the main male hormone. It supports normal male traits such as muscle growth, facial hair, and deep voice. This gel is used in males to treat low testosterone levels. This medicine may be used for other purposes; ask your health care provider or pharmacist if you have questions. What should I tell my health care provider before I take this medicine? They need to know if you have any of these conditions: -breast cancer -diabetes -heart disease -if a male partner is pregnant or trying to get pregnant -kidney disease -liver disease -lung disease -prostate cancer, enlargement -an unusual or allergic reaction to testosterone, soy proteins, other medicines, foods, dyes, or preservatives -pregnant or trying to get pregnant -breast-feeding How should I use this medicine? This medicine is for external use only. This medicine is applied at the same time every day (preferably in the morning) to clean, dry, intact skin. If you take a bath or shower in the morning, apply the gel after the bath or shower. Follow the directions on the prescription label. Make sure that you are using your testosterone gel product correctly and applying it only to the appropriate skin area (see below). Allow the skin to dry a few minutes then cover with clothing to prevent others from coming in contact with the medicine on your skin. The gel is flammable. Avoid fire, flame, or smoking until the gel has dried. Wash your hands with soap and water after use. For AndroGel Packets: Open the packet(s) needed for your dose. You can put the entire dose into your palm all at once or just a little at a time to apply. If you prefer, you can instead squeeze the gel directly onto the area you are applying it to. Apply on the shoulders, upper arm, or abdomen as directed. Do not apply to the scrotum or genitals. Be sure you use the correct total dose. It is best to  wait 5 to 6 hours after application of the gel before showering or swimming. For AndroGel 1%: Pump the dose into the palm of your hand. You can put the entire dose into your palm all at once or just a little at a time to apply. If you prefer, you can instead pump the gel directly onto the area you are applying it to. Apply on the shoulders, upper arm, or abdomen as directed. Do not apply to the scrotum or genitals. Be sure you use the correct total dose. It is best to wait for 5 to 6 hours after application of the gel before showering or swimming. For AndroGel 1.62%: Pump the dose into the palm of your hand. Dispense one pump of gel at a time into the palm of your hand before applying it. If you prefer, you can instead pump the gel directly onto the area you are applying it to. Apply on the shoulders and upper arms as directed. Do not apply to other parts of the body including the abdomen or genitals. Be sure you use the correct total dose. It is best to wait 2 hours after application of the gel before washing, showering, or swimming. For Testim: Open the tube(s) needed for your dose. Squeeze the gel from the tube into the palm of your hand. Apply on the shoulders or upper arms as directed. Do not apply to the scrotum, genitals, or abdomen. Be sure you use the correct total dose. Do not shower or swim for at least 2   hours after application of the gel. For Fortesta: Use the multi-dose pump to pump the gel directly onto the area you are applying it to. Apply on the thighs as directed. Do not apply to the abdomen, penis, scrotum, shoulders or upper arms. Gently rub the gel onto the skin using your finger. Be sure you use the correct total dose. Do not shower or swim for at least 2 hours after application of the gel. A special MedGuide will be given to you by the pharmacist with each prescription and refill. Be sure to read this information carefully each time. Talk to your pediatrician regarding the use of this  medicine in children. Special care may be needed. Overdosage: If you think you have taken too much of this medicine contact a poison control center or emergency room at once. NOTE: This medicine is only for you. Do not share this medicine with others. What if I miss a dose? If you miss a dose, use it as soon as you can. If it is almost time for your next dose, use only that dose. Do not use double or extra doses. What may interact with this medicine? -medicines for diabetes -medicines that treat or prevent blood clots like warfarin -oxyphenbutazone -propranolol -steroid medicines like prednisone or cortisone This list may not describe all possible interactions. Give your health care provider a list of all the medicines, herbs, non-prescription drugs, or dietary supplements you use. Also tell them if you smoke, drink alcohol, or use illegal drugs. Some items may interact with your medicine. What should I watch for while using this medicine? Visit your doctor or health care professional for regular checks on your progress. They will need to check the level of testosterone in your blood. This medicine can transfer from your body to others. If a person or pet comes in contact with the area where this medicine was applied to your skin, they may have a serious risk of side effects. If you cannot avoid skin-to-skin contact with another person, make sure the site where this medicine was applied is covered with clothing. If accidental contact happens, the skin of the person or pet should be washed right away with soap and water. Also, a male partner who is pregnant or trying to get pregnant should avoid contact with the gel or treated skin. This medicine may affect blood sugar levels. If you have diabetes, check with your doctor or health care professional before you change your diet or the dose of your diabetic medicine. This drug is banned from use in athletes by most athletic organizations. What side  effects may I notice from receiving this medicine? Side effects that you should report to your doctor or health care professional as soon as possible: -allergic reactions like skin rash, itching or hives, swelling of the face, lips, or tongue -breast enlargement -breathing problems -changes in mood, especially anger, depression, or rage -dark urine -general ill feeling or flu-like symptoms -light-colored stools -loss of appetite, nausea -nausea, vomiting -right upper belly pain -stomach pain -swelling of ankles -too frequent or persistent erections -trouble passing urine or change in the amount of urine -unusually weak or tired -yellowing of the eyes or skin Side effects that usually do not require medical attention (report to your doctor or health care professional if they continue or are bothersome): -acne -change in sex drive or performance -hair loss -headache This list may not describe all possible side effects. Call your doctor for medical advice about side effects. You may   report side effects to FDA at 1-800-FDA-1088. Where should I keep my medicine? Keep out of the reach of children. This medicine can be abused. Keep your medicine in a safe place to protect it from theft. Do not share this medicine with anyone. Selling or giving away this medicine is dangerous and against the law. Store at room temperature between 15 to 30 degrees C (59 to 86 degrees F). Keep closed until use. Protect from heat and light. This medicine is flammable. Avoid exposure to heat, fire, flame, and smoking. Throw away any unused medicine after the expiration date. NOTE: This sheet is a summary. It may not cover all possible information. If you have questions about this medicine, talk to your doctor, pharmacist, or health care provider.  2012, [employer]/Gold Standard. (02/18/2010 4:45:50 PM) 

## 2012-01-12 ENCOUNTER — Other Ambulatory Visit: Payer: Self-pay | Admitting: *Deleted

## 2012-01-12 ENCOUNTER — Encounter: Payer: Self-pay | Admitting: Family Medicine

## 2012-01-12 MED ORDER — LEVOTHYROXINE SODIUM 50 MCG PO TABS: 50.0000 ug | ORAL_TABLET | Freq: Every day | ORAL | Status: DC

## 2012-01-12 NOTE — Assessment & Plan Note (Signed)
Still running low but improving, given shot today, amount increased to 300 mg q weeks

## 2012-01-12 NOTE — Assessment & Plan Note (Signed)
Weight trending down again, encouraged DASH diet and increased activity.

## 2012-01-12 NOTE — Progress Notes (Signed)
Dustin Spears [id number] 1966/03/13 01/12/2012      Progress Note-Follow Up  Subjective  Chief Complaint  Chief Complaint  Patient presents with  . Follow-up    no concerns noted    HPI  46 year old Caucasian male who is in today for routine followup. Overall he says is improving. He does note still some persistent fatigue but says with CPAP for the sleep apnea and the testosterone injections he is much better. Feels more mentally clear and rested. No recent illness, fevers, chills, chest pain, palpitations, shortness of breath. He notes that cold weather he has not been exercising. Has not reported any excessive neck or knee pain. No recent flare in allergies or headaches thus far this spring.  Past Medical History  Diagnosis Date  . Hyperlipidemia   . Allergy     seasonal  . Thyroid disease   . Overweight 02/26/2011  . Knee pain, right   . History of chicken pox   . SNORING 03/12/2010  . SLEEP APNEA, OBSTRUCTIVE 04/24/2010  . ALLERGIC RHINITIS 03/12/2010  . FATIGUE 03/12/2010  . Headache 03/12/2010  . Neck pain, acute 03/12/2011  . Testosterone deficiency 10/19/2011    Past Surgical History  Procedure Date  . Bicep tendon in left arm 2000    Family History  Problem Relation Age of Onset  . Hyperlipidemia Mother   . Alcohol abuse Father     smoked  . Arthritis Father   . Lupus Paternal Grandmother   . Arthritis Maternal Grandmother   . Ulcers Maternal Grandmother   . Alzheimer's disease Maternal Grandfather     History   Social History  . Marital Status: Married    Spouse Name: N/A    Number of Children: 1  . Years of Education: N/A   Occupational History  . 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 [employer]    Social History Main Topics  . Smoking status: Never Smoker   . Smokeless tobacco: Never Used  . Alcohol Use: Yes     occasional  . Drug Use: No  . Sexually Active: Yes   Other Topics Concern  . Not on file   Social History Narrative   Grew up in [place] to [employer] 1990    Current Outpatient Prescriptions on File Prior to Visit  Medication Sig Dispense Refill  . glucosamine-chondroitin 500-400 MG tablet Take 1 tablet by mouth daily.        [facility] Lanius CAPS Take 1 capsule by mouth daily.  30 capsule  0  . levothyroxine (SYNTHROID, LEVOTHROID) 50 MCG tablet Take 1 tablet (50 mcg total) by mouth daily.  30 tablet  11  . levothyroxine (SYNTHROID, LEVOTHROID) 50 MCG tablet TAKE 1 TABLET BY MOUTH EVERY DAY  30 tablet  0  . multivitamin (THERAGRAN) tablet Take 1 tablet by mouth daily.        . naproxen (NAPROSYN) 500 MG tablet Take 1 tablet (500 mg total) by mouth 2 (two) times daily as needed (pain). With meals  60 tablet  2  . testosterone cypionate (DEPO-TESTOSTERONE) 100 MG/ML injection Inject 2 mLs (200 mg total) into the muscle every 14 (fourteen) days. For IM use only  10 mL  1  . Wheat Dextrin (BENEFIBER) POWD 2 tsp po daily in liquids  730 g  0    No Known Allergies  Review of Systems  Review of Systems  Constitutional: Positive for malaise/fatigue. Negative for fever.  HENT: Negative for congestion.   Eyes:  Negative for discharge.  Respiratory: Negative for shortness of breath.   Cardiovascular: Negative for chest pain, palpitations and leg swelling.  Gastrointestinal: Negative for nausea, abdominal pain and diarrhea.  Genitourinary: Negative for dysuria.  Musculoskeletal: Negative for falls.  Skin: Negative for rash.  Neurological: Negative for loss of consciousness and headaches.  Endo/Heme/Allergies: Negative for polydipsia.  Psychiatric/Behavioral: Negative for depression and suicidal ideas. The patient is not nervous/anxious and does not have insomnia.     Objective  BP 112/73  Pulse 74  Temp(Src) 98 F (36.7 C) (Oral)  Ht 5' 7.75" (1.721 m)  Wt 245 lb 6.4 oz (111.313 kg)  BMI 37.59 kg/m2  SpO2 97%  Physical Exam  Physical Exam  Constitutional: He is oriented to person, place, and time and well-developed,  well-nourished, and in no distress. No distress.  HENT:  Head: Normocephalic and atraumatic.  Eyes: Conjunctivae are normal.  Neck: Neck supple. No thyromegaly present.  Cardiovascular: Normal rate, regular rhythm and normal heart sounds.   No murmur heard. Pulmonary/Chest: Effort normal and breath sounds normal. No respiratory distress.  Abdominal: He exhibits no distension and no mass. There is no tenderness.  Musculoskeletal: He exhibits no edema.  Neurological: He is alert and oriented to person, place, and time.  Skin: Skin is warm.  Psychiatric: Memory, affect and judgment normal.    Lab Results  Component Value Date   TSH 2.08 12/26/2011   Lab Results  Component Value Date   WBC 6.0 12/26/2011   HGB 16.0 12/26/2011   HCT 47.7 12/26/2011   MCV 88.1 12/26/2011   PLT 231.0 12/26/2011   Lab Results  Component Value Date   CREATININE 0.8 12/26/2011   BUN 15 12/26/2011   NA 136 12/26/2011   K 4.5 12/26/2011   CL 104 12/26/2011   CO2 24 12/26/2011   Lab Results  Component Value Date   ALT 32 12/26/2011   AST 28 12/26/2011   ALKPHOS 33* 12/26/2011   BILITOT 0.5 12/26/2011   Lab Results  Component Value Date   CHOL 218* 12/26/2011   Lab Results  Component Value Date   HDL 40.50 12/26/2011   Lab Results  Component Value Date   LDLCALC 106* 09/25/2011   Lab Results  Component Value Date   TRIG 170.0* 12/26/2011   Lab Results  Component Value Date   CHOLHDL 5 12/26/2011     Assessment & Plan  SLEEP APNEA, OBSTRUCTIVE Using his CPAP regularly  Testosterone deficiency Still running low but improving, given shot today, amount increased to 300 mg q weeks  Knee pain, right Tolerable, no c/o today  Overweight Weight trending down again, encouraged DASH diet and increased activity.

## 2012-01-12 NOTE — Assessment & Plan Note (Signed)
Tolerable, no c/o today

## 2012-01-12 NOTE — Assessment & Plan Note (Signed)
Using his CPAP regularly

## 2012-01-12 NOTE — Telephone Encounter (Signed)
Faxed refill request received from pharmacy for 90 day supply levothyroxine. RX sent.

## 2012-01-23 ENCOUNTER — Ambulatory Visit: Payer: Managed Care, Other (non HMO)

## 2012-01-26 ENCOUNTER — Ambulatory Visit (INDEPENDENT_AMBULATORY_CARE_PROVIDER_SITE_OTHER): Payer: Managed Care, Other (non HMO)

## 2012-01-26 ENCOUNTER — Telehealth: Payer: Self-pay

## 2012-01-26 DIAGNOSIS — E349 Endocrine disorder, unspecified: Secondary | ICD-10-CM

## 2012-01-26 DIAGNOSIS — E291 Testicular hypofunction: Secondary | ICD-10-CM

## 2012-01-26 MED ORDER — "NEEDLE (DISP) 22G X 1-1/2"" MISC": Status: DC

## 2012-01-26 MED ORDER — SYRINGE (DISPOSABLE) 3 ML MISC: Status: DC

## 2012-01-26 MED ORDER — TESTOSTERONE CYPIONATE 200 MG/ML IM SOLN
200.0000 mg | INTRAMUSCULAR | Status: DC
  Administered 2012-01-26: 200 mg via INTRAMUSCULAR

## 2012-01-26 NOTE — Telephone Encounter (Signed)
I sent syringes and needles into pharmacy and left a message on patients voicemail to return my call

## 2012-01-26 NOTE — Telephone Encounter (Signed)
Patients spouse called requesting that an RX for needles and syringes go to [facility] in [place]? Pts daughter is a CMA and is willing to go ahead and give patient his testosterone shots? Please advise?

## 2012-01-26 NOTE — Telephone Encounter (Signed)
OK with me please callin a three month supply of the needles and syringes you use with him when he comes in here. That seems to be working well for him.

## 2012-01-26 NOTE — Progress Notes (Signed)
  Subjective:    Patient ID: Dustin Spears, male    DOB: 1966-09-17, 46 y.o.   MRN: [id number]  HPI    Review of Systems     Objective:   Physical Exam   Pt tolerated testosterone injection     Assessment & Plan:

## 2012-01-27 MED ORDER — TESTOSTERONE CYPIONATE 200 MG/ML IM SOLN: 200.0000 mg | INTRAMUSCULAR | Status: DC

## 2012-01-27 NOTE — Telephone Encounter (Signed)
Shredding last RX for Testosterone since it is for 100 mg Testosterone. Going to change it to 200mg . Patients spouse informed that syringes and needles have been faxed to pharmacy as well as Testosterone RX.

## 2012-01-27 NOTE — Telephone Encounter (Signed)
Addended by: Court Joy on: 01/27/2012 11:12 AM   Modules accepted: Orders

## 2012-02-06 ENCOUNTER — Ambulatory Visit: Payer: Managed Care, Other (non HMO)

## 2012-03-31 ENCOUNTER — Telehealth: Payer: Self-pay

## 2012-03-31 NOTE — Telephone Encounter (Signed)
Pts wife called with a couple of questions:  1. Pt has an appt Friday morning and she would like to know if he needs to be fasting. 2. Pts daughter is giving pt his testosterone injections and the last shot made him bleed really bad and he feels horrible like he didn't even have the injection? Pts spouse would like to know if the injection is not given in the right place will it be absorbed?  Please advise and call pts wife back at [phone]

## 2012-03-31 NOTE — Telephone Encounter (Signed)
Patient should come in fasting and have a testosterone, lipid, renal, cbc, hepatic, tsh done then. He will absorb less well if it is not injected in the right spot.

## 2012-04-01 NOTE — Telephone Encounter (Signed)
pts spouse informed 

## 2012-04-02 ENCOUNTER — Other Ambulatory Visit: Payer: Managed Care, Other (non HMO)

## 2012-04-07 ENCOUNTER — Other Ambulatory Visit (INDEPENDENT_AMBULATORY_CARE_PROVIDER_SITE_OTHER): Payer: Managed Care, Other (non HMO)

## 2012-04-07 DIAGNOSIS — E291 Testicular hypofunction: Secondary | ICD-10-CM

## 2012-04-07 DIAGNOSIS — E349 Endocrine disorder, unspecified: Secondary | ICD-10-CM

## 2012-04-07 DIAGNOSIS — E039 Hypothyroidism, unspecified: Secondary | ICD-10-CM

## 2012-04-07 DIAGNOSIS — E785 Hyperlipidemia, unspecified: Secondary | ICD-10-CM

## 2012-04-07 LAB — LIPID PANEL
Cholesterol: 220 mg/dL — ABNORMAL HIGH (ref 0–200)
Total CHOL/HDL Ratio: 6
Triglycerides: 110 mg/dL (ref 0.0–149.0)
VLDL: 22 mg/dL (ref 0.0–40.0)

## 2012-04-07 LAB — CBC
Hemoglobin: 16.6 g/dL (ref 13.0–17.0)
MCHC: 33.1 g/dL (ref 30.0–36.0)
RDW: 14 % (ref 11.5–14.6)

## 2012-04-07 LAB — RENAL FUNCTION PANEL
BUN: 18 mg/dL (ref 6–23)
Chloride: 107 mEq/L (ref 96–112)
Creatinine, Ser: 0.9 mg/dL (ref 0.4–1.5)
GFR: 96.07 mL/min (ref >60.00)
Phosphorus: 3 mg/dL (ref 2.3–4.6)

## 2012-04-07 LAB — TESTOSTERONE: Testosterone: 536.02 ng/dL (ref 350.00–890.00)

## 2012-04-07 LAB — HEPATIC FUNCTION PANEL
AST: 28 U/L (ref 0–37)
Bilirubin, Direct: 0 mg/dL (ref 0.0–0.3)
Total Bilirubin: 0.7 mg/dL (ref 0.3–1.2)

## 2012-04-09 ENCOUNTER — Ambulatory Visit (INDEPENDENT_AMBULATORY_CARE_PROVIDER_SITE_OTHER): Payer: Managed Care, Other (non HMO) | Admitting: Family Medicine

## 2012-04-09 ENCOUNTER — Encounter: Payer: Self-pay | Admitting: Family Medicine

## 2012-04-09 VITALS — BP 123/73 | HR 66 | Temp 97.2°F | Ht 68.5 in | Wt 246.8 lb

## 2012-04-09 DIAGNOSIS — E785 Hyperlipidemia, unspecified: Secondary | ICD-10-CM

## 2012-04-09 DIAGNOSIS — E039 Hypothyroidism, unspecified: Secondary | ICD-10-CM

## 2012-04-09 DIAGNOSIS — G4733 Obstructive sleep apnea (adult) (pediatric): Secondary | ICD-10-CM

## 2012-04-09 DIAGNOSIS — E349 Endocrine disorder, unspecified: Secondary | ICD-10-CM

## 2012-04-09 DIAGNOSIS — J309 Allergic rhinitis, unspecified: Secondary | ICD-10-CM

## 2012-04-09 DIAGNOSIS — E291 Testicular hypofunction: Secondary | ICD-10-CM

## 2012-04-09 DIAGNOSIS — E663 Overweight: Secondary | ICD-10-CM

## 2012-04-09 NOTE — Assessment & Plan Note (Signed)
Well treated on current dose of levothyroxine we'll not make any changes to medications at this time.

## 2012-04-09 NOTE — Assessment & Plan Note (Signed)
Using CPAP routinely and does feel improved. Less tired.

## 2012-04-09 NOTE — Progress Notes (Signed)
Patient ID: Dustin Spears, male   DOB: Dec 23, 1965, 46 y.o.   MRN: [id number] Dustin Spears [id number] Nov 01, 1965 04/09/2012      Progress Note-Follow Up  Subjective  Chief Complaint  Chief Complaint  Patient presents with  . Follow-up    HPI  Patient is a 46 year old Caucasian male who is in today for followup on multiple medical problems. He has been slowly making dietary changes and is trying the less processed foods and less carbs. Has started with ileo-diet. Acknowledges he only gave up sugar sodas just this week. He denies any recent illness, fevers, chills, chest pain, palpitations, shortness of breath, GI or GU concerns. He uses his CPAP nightly and does bleed between a testosterone his fatigue while still persistent is improved. No acute concerns. Allergies have been bothering him and he was taking Claritin daily and despite taking it at bedtime it was causing sedation he stopped it his fatigue is improved somewhat.  Past Medical History  Diagnosis Date  . Hyperlipidemia   . Allergy     seasonal  . Thyroid disease   . Overweight 02/26/2011  . Knee pain, right   . History of chicken pox   . SNORING 03/12/2010  . SLEEP APNEA, OBSTRUCTIVE 04/24/2010  . ALLERGIC RHINITIS 03/12/2010  . FATIGUE 03/12/2010  . Headache 03/12/2010  . Neck pain, acute 03/12/2011  . Testosterone deficiency 10/19/2011    Past Surgical History  Procedure Date  . Bicep tendon in left arm 2000    Family History  Problem Relation Age of Onset  . Hyperlipidemia Mother   . Alcohol abuse Father     smoked  . Arthritis Father   . Lupus Paternal Grandmother   . Arthritis Maternal Grandmother   . Ulcers Maternal Grandmother   . Alzheimer's disease Maternal Grandfather     History   Social History  . Marital Status: Married    Spouse Name: N/A    Number of Children: 1  . Years of Education: N/A   Occupational History  . 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 [employer]    Social History Main Topics  . Smoking  status: Never Smoker   . Smokeless tobacco: Never Used  . Alcohol Use: Yes     occasional  . Drug Use: No  . Sexually Active: Yes   Other Topics Concern  . Not on file   Social History Narrative   Grew up in [place] to [employer] 1990    Current Outpatient Prescriptions on File Prior to Visit  Medication Sig Dispense Refill  . Calcium Citrate-Vitamin D 200-250 MG-UNIT TABS Take 1 capsule by mouth daily.  120 tablet  0  . glucosamine-chondroitin 500-400 MG tablet Take 1 tablet by mouth daily.        [facility] CAPS Take 1 capsule by mouth daily.  30 capsule  0  . levothyroxine (SYNTHROID, LEVOTHROID) 50 MCG tablet Take 1 tablet (50 mcg total) by mouth daily.  90 tablet  2  . multivitamin (THERAGRAN) tablet Take 1 tablet by mouth daily.        [facility] Kitchen testosterone cypionate (DEPOTESTOTERONE CYPIONATE) 200 MG/ML injection Inject 1 mL (200 mg total) into the muscle every 14 (fourteen) days.  10 mL  1  . Wheat Dextrin (BENEFIBER) POWD 2 tsp po daily in liquids  730 g  0  . NEEDLE, DISP, 22 G (B-D HYPODERMIC NEEDLE 22GX1.5") 22G X 1-1/2" MISC Use every 2 weeks with testosterone injection  25 each  2  . Syringe, Disposable, (B-D SYRINGE LUER-LOK 3CC) 3 ML MISC Use with testosterone injection every 2 weeks. 1 ml  25 each  2    No Known Allergies  Review of Systems  Review of Systems  Constitutional: Positive for malaise/fatigue. Negative for fever.  HENT: Negative for congestion.   Eyes: Negative for discharge.  Respiratory: Negative for shortness of breath.   Cardiovascular: Negative for chest pain, palpitations and leg swelling.  Gastrointestinal: Negative for nausea, abdominal pain and diarrhea.  Genitourinary: Negative for dysuria.  Musculoskeletal: Negative for falls.  Skin: Negative for rash.  Neurological: Negative for loss of consciousness and headaches.  Endo/Heme/Allergies: Negative for polydipsia.  Psychiatric/Behavioral: Negative for depression and suicidal ideas. The patient is  not nervous/anxious and does not have insomnia.     Objective  BP 123/73  Pulse 66  Temp 97.2 F (36.2 C) (Temporal)  Ht 5' 8.5" (1.74 m)  Wt 246 lb 12.8 oz (111.948 kg)  BMI 36.98 kg/m2  SpO2 97%  Physical Exam  Physical Exam  Constitutional: He is oriented to person, place, and time and well-developed, well-nourished, and in no distress. No distress.  HENT:  Head: Normocephalic and atraumatic.  Eyes: Conjunctivae are normal.  Neck: Neck supple. No thyromegaly present.  Cardiovascular: Normal rate, regular rhythm and normal heart sounds.   No murmur heard. Pulmonary/Chest: Effort normal and breath sounds normal. No respiratory distress.  Abdominal: He exhibits no distension and no mass. There is no tenderness.  Musculoskeletal: He exhibits no edema.  Neurological: He is alert and oriented to person, place, and time.  Skin: Skin is warm.  Psychiatric: Memory, affect and judgment normal.    Lab Results  Component Value Date   TSH 1.33 04/07/2012   Lab Results  Component Value Date   WBC 6.7 04/07/2012   HGB 16.6 04/07/2012   HCT 50.0 04/07/2012   MCV 88.3 04/07/2012   PLT 235.0 04/07/2012   Lab Results  Component Value Date   CREATININE 0.9 04/07/2012   BUN 18 04/07/2012   NA 136 04/07/2012   K 4.2 04/07/2012   CL 107 04/07/2012   CO2 23 04/07/2012   Lab Results  Component Value Date   ALT 30 04/07/2012   AST 28 04/07/2012   ALKPHOS 31* 04/07/2012   BILITOT 0.7 04/07/2012   Lab Results  Component Value Date   CHOL 220* 04/07/2012   Lab Results  Component Value Date   HDL 40.00 04/07/2012   Lab Results  Component Value Date   LDLCALC 106* 09/25/2011   Lab Results  Component Value Date   TRIG 110.0 04/07/2012   Lab Results  Component Value Date   CHOLHDL 6 04/07/2012     Assessment & Plan  Hyperlipidemia Patient is trying to avoid transplants. He is taking his krill oil regularly. He is trying to eat less processed foods and has begun to exercise just very  recently. We will repeat check lipid panel at next visit in 6 months time.  HYPOTHYROIDISM Well treated on current dose of levothyroxine we'll not make any changes to medications at this time.  Testosterone deficiency Levels have normalized on current treatment no changes today.  Overweight Given handout on DASH diet again. Discussed the need for healthy carbs lean proteins and small frequent meals. Patient is going to try the Paleo her diet and increase exercise.  SLEEP APNEA, OBSTRUCTIVE Using CPAP routinely and does feel improved. Less tired.  ALLERGIC RHINITIS Claritin cause sedation but he does  tolerate Zyrtec. No significant complaints at today's visit.

## 2012-04-09 NOTE — Assessment & Plan Note (Signed)
Levels have normalized on current treatment no changes today.

## 2012-04-09 NOTE — Assessment & Plan Note (Signed)
Patient is trying to avoid transplants. He is taking his krill oil regularly. He is trying to eat less processed foods and has begun to exercise just very recently. We will repeat check lipid panel at next visit in 6 months time.

## 2012-04-09 NOTE — Assessment & Plan Note (Signed)
Given handout on DASH diet again. Discussed the need for healthy carbs lean proteins and small frequent meals. Patient is going to try the Paleo her diet and increase exercise.

## 2012-04-09 NOTE — Patient Instructions (Addendum)
Hypertriglyceridemia  Diet for High blood levels of Triglycerides Most fats in food are triglycerides. Triglycerides in your blood are stored as fat in your body. High levels of triglycerides in your blood may put you at a greater risk for heart disease and stroke.  Normal triglyceride levels are less than 150 mg/dL. Borderline high levels are 150-199 mg/dl. High levels are 200 - 499 mg/dL, and very high triglyceride levels are greater than 500 mg/dL. The decision to treat high triglycerides is generally based on the level. For people with borderline or high triglyceride levels, treatment includes weight loss and exercise. Drugs are recommended for people with very high triglyceride levels. Many people who need treatment for high triglyceride levels have metabolic syndrome. This syndrome is a collection of disorders that often include: insulin resistance, high blood pressure, blood clotting problems, high cholesterol and triglycerides. TESTING PROCEDURE FOR TRIGLYCERIDES  You should not eat 4 hours before getting your triglycerides measured. The normal range of triglycerides is between 10 and 250 milligrams per deciliter (mg/dl). Some people may have extreme levels (1000 or above), but your triglyceride level may be too high if it is above 150 mg/dl, depending on what other risk factors you have for heart disease.   People with high blood triglycerides may also have high blood cholesterol levels. If you have high blood cholesterol as well as high blood triglycerides, your risk for heart disease is probably greater than if you only had high triglycerides. High blood cholesterol is one of the main risk factors for heart disease.  CHANGING YOUR DIET  Your weight can affect your blood triglyceride level. If you are more than 20% above your ideal body weight, you may be able to lower your blood triglycerides by losing weight. Eating less and exercising regularly is the best way to combat this. Fat provides  more calories than any other food. The best way to lose weight is to eat less fat. Only 30% of your total calories should come from fat. Less than 7% of your diet should come from saturated fat. A diet low in fat and saturated fat is the same as a diet to decrease blood cholesterol. By eating a diet lower in fat, you may lose weight, lower your blood cholesterol, and lower your blood triglyceride level.  Eating a diet low in fat, especially saturated fat, may also help you lower your blood triglyceride level. Ask your dietitian to help you figure how much fat you can eat based on the number of calories your caregiver has prescribed for you.  Exercise, in addition to helping with weight loss may also help lower triglyceride levels.   Alcohol can increase blood triglycerides. You may need to stop drinking alcoholic beverages.   Too much carbohydrate in your diet may also increase your blood triglycerides. Some complex carbohydrates are necessary in your diet. These may include bread, rice, potatoes, other starchy vegetables and cereals.   Reduce "simple" carbohydrates. These may include pure sugars, candy, honey, and jelly without losing other nutrients. If you have the kind of high blood triglycerides that is affected by the amount of carbohydrates in your diet, you will need to eat less sugar and less high-sugar foods. Your caregiver can help you with this.   Adding 2-4 grams of fish oil (EPA+ DHA) may also help lower triglycerides. Speak with your caregiver before adding any supplements to your regimen.  Following the Diet  Maintain your ideal weight. Your caregivers can help you with a diet. Generally,   eating less food and getting more exercise will help you lose weight. Joining a weight control group may also help. Ask your caregivers for a good weight control group in your area.  Eat low-fat foods instead of high-fat foods. This can help you lose weight too.  These foods are lower in fat. Eat MORE  of these:   Dried beans, peas, and lentils.   Egg whites.   Low-fat cottage cheese.   Fish.   Lean cuts of meat, such as round, sirloin, rump, and flank (cut extra fat off meat you fix).   Whole grain breads, cereals and pasta.   Skim and nonfat dry milk.   Low-fat yogurt.   Poultry without the skin.   Cheese made with skim or part-skim milk, such as mozzarella, parmesan, farmers', ricotta, or pot cheese.  These are higher fat foods. Eat LESS of these:   Whole milk and foods made from whole milk, such as American, blue, cheddar, monterey jack, and swiss cheese   High-fat meats, such as luncheon meats, sausages, knockwurst, bratwurst, hot dogs, ribs, corned beef, ground pork, and regular ground beef.   Fried foods.  Limit saturated fats in your diet. Substituting unsaturated fat for saturated fat may decrease your blood triglyceride level. You will need to read package labels to know which products contain saturated fats.  These foods are high in saturated fat. Eat LESS of these:   Fried pork skins.   Whole milk.   Skin and fat from poultry.   Palm oil.   Butter.   Shortening.   Cream cheese.   Bacon.   Margarines and baked goods made from listed oils.   Vegetable shortenings.   Chitterlings.   Fat from meats.   Coconut oil.   Palm kernel oil.   Lard.   Cream.   Sour cream.   Fatback.   Coffee whiteners and non-dairy creamers made with these oils.   Cheese made from whole milk.  Use unsaturated fats (both polyunsaturated and monounsaturated) moderately. Remember, even though unsaturated fats are better than saturated fats; you still want a diet low in total fat.  These foods are high in unsaturated fat:   Canola oil.   Sunflower oil.   Mayonnaise.   Almonds.   Peanuts.   Pine nuts.   Margarines made with these oils.   Safflower oil.   Olive oil.   Avocados.   Cashews.   Peanut butter.   Sunflower seeds.   Soybean oil.     Peanut oil.   Olives.   Pecans.   Walnuts.   Pumpkin seeds.  Avoid sugar and other high-sugar foods. This will decrease carbohydrates without decreasing other nutrients. Sugar in your food goes rapidly to your blood. When there is excess sugar in your blood, your liver may use it to make more triglycerides. Sugar also contains calories without other important nutrients.  Eat LESS of these:   Sugar, brown sugar, powdered sugar, jam, jelly, preserves, honey, syrup, molasses, pies, candy, cakes, cookies, frosting, pastries, colas, soft drinks, punches, fruit drinks, and regular gelatin.   Avoid alcohol. Alcohol, even more than sugar, may increase blood triglycerides. In addition, alcohol is high in calories and low in nutrients. Ask for sparkling water, or a diet soft drink instead of an alcoholic beverage.  Suggestions for planning and preparing meals   Bake, broil, grill or roast meats instead of frying.   Remove fat from meats and skin from poultry before cooking.   Add spices,   herbs, lemon juice or vinegar to vegetables instead of salt, rich sauces or gravies.   Use a non-stick skillet without fat or use no-stick sprays.   Cool and refrigerate stews and broth. Then remove the hardened fat floating on the surface before serving.   Refrigerate meat drippings and skim off fat to make low-fat gravies.   Serve more fish.   Use less butter, margarine and other high-fat spreads on bread or vegetables.   Use skim or reconstituted non-fat dry milk for cooking.   Cook with low-fat cheeses.   Substitute low-fat yogurt or cottage cheese for all or part of the sour cream in recipes for sauces, dips or congealed salads.   Use half yogurt/half mayonnaise in salad recipes.   Substitute evaporated skim milk for cream. Evaporated skim milk or reconstituted non-fat dry milk can be whipped and substituted for whipped cream in certain recipes.   Choose fresh fruits for dessert instead of  high-fat foods such as pies or cakes. Fruits are naturally low in fat.  When Dining Out   Order low-fat appetizers such as fruit or vegetable juice, pasta with vegetables or tomato sauce.   Select clear, rather than cream soups.   Ask that dressings and gravies be served on the side. Then use less of them.   Order foods that are baked, broiled, poached, steamed, stir-fried, or roasted.   Ask for margarine instead of butter, and use only a small amount.   Drink sparkling water, unsweetened tea or coffee, or diet soft drinks instead of alcohol or other sweet beverages.  QUESTIONS AND ANSWERS ABOUT OTHER FATS IN THE BLOOD: SATURATED FAT, TRANS FAT, AND CHOLESTEROL What is trans fat? Trans fat is a type of fat that is formed when vegetable oil is hardened through a process called hydrogenation. This process helps makes foods more solid, gives them shape, and prolongs their shelf life. Trans fats are also called hydrogenated or partially hydrogenated oils.  What do saturated fat, trans fat, and cholesterol in foods have to do with heart disease? Saturated fat, trans fat, and cholesterol in the diet all raise the level of LDL "bad" cholesterol in the blood. The higher the LDL cholesterol, the greater the risk for coronary heart disease (CHD). Saturated fat and trans fat raise LDL similarly.  What foods contain saturated fat, trans fat, and cholesterol? High amounts of saturated fat are found in animal products, such as fatty cuts of meat, chicken skin, and full-fat dairy products like butter, whole milk, cream, and cheese, and in tropical vegetable oils such as palm, palm kernel, and coconut oil. Trans fat is found in some of the same foods as saturated fat, such as vegetable shortening, some margarines (especially hard or stick margarine), crackers, cookies, baked goods, fried foods, salad dressings, and other processed foods made with partially hydrogenated vegetable oils. Small amounts of trans fat  also occur naturally in some animal products, such as milk products, beef, and lamb. Foods high in cholesterol include liver, other organ meats, egg yolks, shrimp, and full-fat dairy products. How can I use the new food label to make heart-healthy food choices? Check the Nutrition Facts panel of the food label. Choose foods lower in saturated fat, trans fat, and cholesterol. For saturated fat and cholesterol, you can also use the Percent Daily Value (%DV): 5% DV or less is low, and 20% DV or more is high. (There is no %DV for trans fat.) Use the Nutrition Facts panel to choose foods low in   saturated fat and cholesterol, and if the trans fat is not listed, read the ingredients and limit products that list shortening or hydrogenated or partially hydrogenated vegetable oil, which tend to be high in trans fat. POINTS TO REMEMBER: YOU NEED A LITTLE TLC (THERAPEUTIC LIFESTYLE CHANGES)  Discuss your risk for heart disease with your caregivers, and take steps to reduce risk factors.   Change your diet. Choose foods that are low in saturated fat, trans fat, and cholesterol.   Add exercise to your daily routine if it is not already being done. Participate in physical activity of moderate intensity, like brisk walking, for at least 30 minutes on most, and preferably all days of the week. No time? Break the 30 minutes into three, 10-minute segments during the day.   Stop smoking. If you do smoke, contact your caregiver to discuss ways in which they can help you quit.   Do not use street drugs.   Maintain a normal weight.   Maintain a healthy blood pressure.   Keep up with your blood work for checking the fats in your blood as directed by your caregiver.  Document Released: 07/24/2004 Document Revised: 09/25/2011 Document Reviewed: 02/19/2009 [employer] Patient Information 2012 [employer]. 

## 2012-04-09 NOTE — Assessment & Plan Note (Signed)
Claritin cause sedation but he does tolerate Zyrtec. No significant complaints at today's visit.

## 2012-05-19 ENCOUNTER — Encounter: Payer: Self-pay | Admitting: Family Medicine

## 2012-05-19 ENCOUNTER — Ambulatory Visit (INDEPENDENT_AMBULATORY_CARE_PROVIDER_SITE_OTHER): Payer: Managed Care, Other (non HMO) | Admitting: Family Medicine

## 2012-05-19 VITALS — BP 126/75 | HR 49 | Temp 97.7°F | Ht 68.5 in | Wt 230.8 lb

## 2012-05-19 DIAGNOSIS — M79644 Pain in right finger(s): Secondary | ICD-10-CM

## 2012-05-19 DIAGNOSIS — M779 Enthesopathy, unspecified: Secondary | ICD-10-CM

## 2012-05-19 DIAGNOSIS — M79609 Pain in unspecified limb: Secondary | ICD-10-CM

## 2012-05-19 HISTORY — DX: Enthesopathy, unspecified: M77.9

## 2012-05-19 MED ORDER — MELOXICAM 15 MG PO TABS: 15.0000 mg | ORAL_TABLET | Freq: Every day | ORAL | Status: DC | PRN

## 2012-05-19 NOTE — Patient Instructions (Addendum)
Tendinitis Tendinitis is swelling and inflammation of the tendons. Tendons are band-like tissues that connect muscle to bone. Tendinitis commonly occurs in the:   Shoulders (rotator cuff).   Heels (Achilles tendon).   Elbows (triceps tendon).  CAUSES Tendinitis is usually caused by overusing the tendon, muscles, and joints involved. When the tissue surrounding a tendon (synovium) becomes inflamed, it is called tenosynovitis. Tendinitis commonly develops in people whose jobs require repetitive motions. SYMPTOMS  Pain.   Tenderness.   Mild swelling.  DIAGNOSIS Tendinitis is usually diagnosed by physical exam. Your caregiver may also order X-rays or other imaging tests. TREATMENT Your caregiver may recommend certain medicines or exercises for your treatment. HOME CARE INSTRUCTIONS   Use a sling or splint for as long as directed by your caregiver until the pain decreases.   Put ice on the injured area.   Put ice in a plastic bag.   Place a towel between your skin and the bag.   Leave the ice on for 15 to 20 minutes, 3 to 4 times a day.   Avoid using the limb while the tendon is painful. Perform gentle range of motion exercises only as directed by your caregiver. Stop exercises if pain or discomfort increase, unless directed otherwise by your caregiver.   Only take over-the-counter or prescription medicines for pain, discomfort, or fever as directed by your caregiver.  SEEK MEDICAL CARE IF:   Your pain and swelling increase.   You develop new, unexplained symptoms, especially increased numbness in the hands.  MAKE SURE YOU:   Understand these instructions.   Will watch your condition.   Will get help right away if you are not doing well or get worse.  Document Released: 10/03/2000 Document Revised: 09/25/2011 Document Reviewed: 12/23/2010 [facility] Patient Information 2012 [place], [place].  Remember to ice and apply Voltaren gel or Aspercreme twice daily when flares  occure wear split as much as tolerated and call if no improvement

## 2012-05-20 NOTE — Assessment & Plan Note (Addendum)
Right third finger, acute. He had an episode that was similar a couple months ago but it resolved spontaneously and was not as intense. This episode started 2 days ago and kept him up last night. He notes the pain is associated with a small amount of swelling in the finger and can be as severe as 8/10. Worse with movement and pain can radiate up finger and up the arm as well. He is placed in immobility splints for the hand. Uric acid was normal. He is asked to ice it twice a day and apply Voltaren gel which he has. He is given a prescription on meloxicam to take the next week and then as needed. If symptoms worsen or do not resolve she will notify us for further evaluation and possible referral.

## 2012-05-20 NOTE — Progress Notes (Signed)
Patient ID: Dustin Spears, male   DOB: 1966-05-31, 46 y.o.   MRN: [id number] Dustin Spears [id number] 01-06-1966 05/20/2012      Progress Note-Follow Up  Subjective  Chief Complaint  Chief Complaint  Patient presents with  . 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 He is having some recurrent trouble with pain in his right middle finger. An episode similar in the last month or 2 but resolved spontaneously and was not as intense. This episode started 2 days ago when he denies trauma. Noted is been mildly swollen but denies heat or redness. Denies trauma. Pain has been as severe as 8/10 it is causing discomfort sleeping last night and he is gripping the pain is worse and is interrupting his work. The pain can radiate up the femur and down the arm as well. No other pain in others fingers is noted no other recent illness, GI or GU complaints. No fevers, chest pain or other concerns  Past Medical History  Diagnosis Date  . Hyperlipidemia   . Allergy     seasonal  . Thyroid disease   . Overweight 02/26/2011  . Knee pain, right   . History of chicken pox   . SNORING 03/12/2010  . SLEEP APNEA, OBSTRUCTIVE 04/24/2010  . ALLERGIC RHINITIS 03/12/2010  . FATIGUE 03/12/2010  . Headache 03/12/2010  . Neck pain, acute 03/12/2011  . Testosterone deficiency 10/19/2011  . Tendonitis 05/19/2012    Past Surgical History  Procedure Date  . Bicep tendon in left arm 2000    Family History  Problem Relation Age of Onset  . Hyperlipidemia Mother   . Alcohol abuse Father     smoked  . Arthritis Father   . Lupus Paternal Grandmother   . Arthritis Maternal Grandmother   . Ulcers Maternal Grandmother   . Alzheimer's disease Maternal Grandfather     History   Social History  . Marital Status: Married    Spouse Name: N/A    Number of Children: 1  . Years of Education: N/A     Occupational History  . 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 [employer]    Social History Main Topics  . Smoking status: Never Smoker   . Smokeless tobacco: Never Used  . Alcohol Use: Yes     occasional  . Drug Use: No  . Sexually Active: Yes   Other Topics Concern  . Not on file   Social History Narrative   Grew up in [place] to [employer] 1990    Current Outpatient Prescriptions on File Prior to Visit  Medication Sig Dispense Refill  . Calcium Citrate-Vitamin D 200-250 MG-UNIT TABS Take 1 capsule by mouth daily.  120 tablet  0  . glucosamine-chondroitin 500-400 MG tablet Take 1 tablet by mouth daily.        [place] Lanius CAPS Take 1 capsule by mouth daily.  30 capsule  0  . levothyroxine (SYNTHROID, LEVOTHROID) 50 MCG tablet Take 1 tablet (50 mcg total) by mouth daily.  90 tablet  2  . multivitamin (THERAGRAN) tablet Take 1 tablet by mouth daily.        Marland Kitchen NEEDLE, DISP, 22 G (B-D HYPODERMIC NEEDLE 22GX1.5") 22G X 1-1/2" MISC Use every 2 weeks with testosterone injection  25 each  2  . Syringe, Disposable, (B-D  SYRINGE LUER-LOK 3CC) 3 ML MISC Use with testosterone injection every 2 weeks. 1 ml  25 each  2  . testosterone cypionate (DEPOTESTOTERONE CYPIONATE) 200 MG/ML injection Inject 1 mL (200 mg total) into the muscle every 14 (fourteen) days.  10 mL  1  . Wheat Dextrin (BENEFIBER) POWD 2 tsp po daily in liquids  730 g  0    No Known Allergies  Review of Systems  Review of Systems  Constitutional: Negative for fever and malaise/fatigue.  HENT: Negative for congestion.   Eyes: Negative for discharge.  Respiratory: Negative for shortness of breath.   Cardiovascular: Negative for chest pain, palpitations and leg swelling.  Gastrointestinal: Negative for nausea, abdominal pain and diarrhea.  Genitourinary: Negative for dysuria.  Musculoskeletal: Positive for joint pain. Negative for falls.       Right 3rd finger pain, swollen  Skin: Negative for rash.  Neurological: Negative for loss  of consciousness and headaches.  Endo/Heme/Allergies: Negative for polydipsia.  Psychiatric/Behavioral: Negative for depression and suicidal ideas. The patient is not nervous/anxious and does not have insomnia.     Objective  BP 126/75  Pulse 49  Temp 97.7 F (36.5 C) (Temporal)  Ht 5' 8.5" (1.74 m)  Wt 230 lb 12.8 oz (104.69 kg)  BMI 34.58 kg/m2  SpO2 99%  Physical Exam  Physical Exam  Constitutional: He is oriented to person, place, and time and well-developed, well-nourished, and in no distress. No distress.  HENT:  Head: Normocephalic and atraumatic.  Eyes: Conjunctivae are normal.  Neck: Neck supple. No thyromegaly present.  Cardiovascular: Normal rate, regular rhythm and normal heart sounds.   No murmur heard. Pulmonary/Chest: Effort normal and breath sounds normal. No respiratory distress.  Abdominal: He exhibits no distension and no mass. There is no tenderness.  Musculoskeletal: He exhibits edema and tenderness.       Right 3 rd finger mildly swollen throughout. No erythema or warmth. No sign of trauma. Mild pain with palp over 1st mcp joint  Neurological: He is alert and oriented to person, place, and time.  Skin: Skin is warm.  Psychiatric: Memory, affect and judgment normal.    Lab Results  Component Value Date   TSH 1.33 04/07/2012   Lab Results  Component Value Date   WBC 6.7 04/07/2012   HGB 16.6 04/07/2012   HCT 50.0 04/07/2012   MCV 88.3 04/07/2012   PLT 235.0 04/07/2012   Lab Results  Component Value Date   CREATININE 0.9 04/07/2012   BUN 18 04/07/2012   NA 136 04/07/2012   K 4.2 04/07/2012   CL 107 04/07/2012   CO2 23 04/07/2012   Lab Results  Component Value Date   ALT 30 04/07/2012   AST 28 04/07/2012   ALKPHOS 31* 04/07/2012   BILITOT 0.7 04/07/2012   Lab Results  Component Value Date   CHOL 220* 04/07/2012   Lab Results  Component Value Date   HDL 40.00 04/07/2012   Lab Results  Component Value Date   LDLCALC 106* 09/25/2011   Lab Results   Component Value Date   TRIG 110.0 04/07/2012   Lab Results  Component Value Date   CHOLHDL 6 04/07/2012     Assessment & Plan  Tendonitis Right third finger, acute. He had an episode that was similar a couple months ago but it resolved spontaneously and was not as intense. This episode started 2 days ago and kept him up last night. He notes the pain is associated with a small  amount of swelling in the finger and can be as severe as 8/10. Worse with movement and pain can radiate up finger and up the arm as well. He is placed in immobility splints for the hand. Uric acid was normal. He is asked to ice it twice a day and apply Voltaren gel which he has. He is given a prescription on meloxicam to take the next week and then as needed. If symptoms worsen or do not resolve she will notify us for further evaluation and possible referral.

## 2012-07-16 ENCOUNTER — Encounter: Payer: Self-pay | Admitting: Family Medicine

## 2012-07-16 ENCOUNTER — Ambulatory Visit (INDEPENDENT_AMBULATORY_CARE_PROVIDER_SITE_OTHER): Payer: Managed Care, Other (non HMO) | Admitting: Family Medicine

## 2012-07-16 VITALS — BP 123/77 | HR 54 | Temp 98.0°F | Ht 68.5 in | Wt 219.8 lb

## 2012-07-16 DIAGNOSIS — G4733 Obstructive sleep apnea (adult) (pediatric): Secondary | ICD-10-CM

## 2012-07-16 DIAGNOSIS — E785 Hyperlipidemia, unspecified: Secondary | ICD-10-CM

## 2012-07-16 DIAGNOSIS — Z0289 Encounter for other administrative examinations: Secondary | ICD-10-CM

## 2012-07-16 DIAGNOSIS — E663 Overweight: Secondary | ICD-10-CM

## 2012-07-16 DIAGNOSIS — E349 Endocrine disorder, unspecified: Secondary | ICD-10-CM

## 2012-07-16 DIAGNOSIS — E291 Testicular hypofunction: Secondary | ICD-10-CM

## 2012-07-16 DIAGNOSIS — R5381 Other malaise: Secondary | ICD-10-CM

## 2012-07-16 DIAGNOSIS — Z025 Encounter for examination for participation in sport: Secondary | ICD-10-CM

## 2012-07-16 HISTORY — DX: Encounter for examination for participation in sport: Z02.5

## 2012-07-16 NOTE — Assessment & Plan Note (Signed)
Continues to have good, slow steady weight loss, is avoiding processed foods, soda and trans fats, encouraged same ongoing efforts

## 2012-07-16 NOTE — Assessment & Plan Note (Signed)
Feeling better with tesoterone treatment and weight loss

## 2012-07-16 NOTE — Assessment & Plan Note (Signed)
Adequately treated at this time, continue shots every other week for now

## 2012-07-16 NOTE — Assessment & Plan Note (Signed)
Mild, continue MegaRed and avoid trans fats.

## 2012-07-16 NOTE — Patient Instructions (Signed)
Testosterone injection What is this medicine? TESTOSTERONE (tes TOS ter one) is the main male hormone. It supports normal male development such as muscle growth, facial hair, and deep voice. It is used in males to treat low testosterone levels. This medicine may be used for other purposes; ask your health care provider or pharmacist if you have questions. What should I tell my health care provider before I take this medicine? They need to know if you have any of these conditions: -breast cancer -diabetes -heart disease -kidney disease -liver disease -lung disease -prostate cancer, enlargement -an unusual or allergic reaction to testosterone, other medicines, foods, dyes, or preservatives -pregnant or trying to get pregnant -breast-feeding How should I use this medicine? This medicine is for injection into a muscle. It is usually given by a health care professional in a hospital or clinic setting. Contact your pediatrician regarding the use of this medicine in children. While this medicine may be prescribed for children as young as 12 years of age for selected conditions, precautions do apply. Overdosage: If you think you have taken too much of this medicine contact a poison control center or emergency room at once. NOTE: This medicine is only for you. Do not share this medicine with others. What if I miss a dose? Try not to miss a dose. Your doctor or health care professional will tell you when your next injection is due. Notify the office if you are unable to keep an appointment. What may interact with this medicine? -medicines for diabetes -medicines that treat or prevent blood clots like warfarin -oxyphenbutazone -propranolol -steroid medicines like prednisone or cortisone This list may not describe all possible interactions. Give your health care provider a list of all the medicines, herbs, non-prescription drugs, or dietary supplements you use. Also tell them if you smoke, drink  alcohol, or use illegal drugs. Some items may interact with your medicine. What should I watch for while using this medicine? Visit your doctor or health care professional for regular checks on your progress. They will need to check the level of testosterone in your blood. This medicine may affect blood sugar levels. If you have diabetes, check with your doctor or health care professional before you change your diet or the dose of your diabetic medicine. This drug is banned from use in athletes by most athletic organizations. What side effects may I notice from receiving this medicine? Side effects that you should report to your doctor or health care professional as soon as possible: -allergic reactions like skin rash, itching or hives, swelling of the face, lips, or tongue -breast enlargement -breathing problems -changes in mood, especially anger, depression, or rage -dark urine -general ill feeling or flu-like symptoms -light-colored stools -loss of appetite, nausea -nausea, vomiting -right upper belly pain -stomach pain -swelling of ankles -too frequent or persistent erections -trouble passing urine or change in the amount of urine -unusually weak or tired -yellowing of the eyes or skin Additional side effects that can occur in women include: -deep or hoarse voice -facial hair growth -irregular menstrual periods Side effects that usually do not require medical attention (report to your doctor or health care professional if they continue or are bothersome): -acne -change in sex drive or performance -hair loss -headache This list may not describe all possible side effects. Call your doctor for medical advice about side effects. You may report side effects to FDA at 1-800-FDA-1088. Where should I keep my medicine? Keep out of the reach of children. This medicine   can be abused. Keep your medicine in a safe place to protect it from theft. Do not share this medicine with anyone.  Selling or giving away this medicine is dangerous and against the law. Store at room temperature between 20 and 25 degrees C (68 and 77 degrees F). Do not freeze. Protect from light. Follow the directions for the product you are prescribed. Throw away any unused medicine after the expiration date. NOTE: This sheet is a summary. It may not cover all possible information. If you have questions about this medicine, talk to your doctor, pharmacist, or health care provider.  2012, [employer]/Gold Standard. (12/17/2007 4:13:46 PM) 

## 2012-07-16 NOTE — Assessment & Plan Note (Signed)
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

## 2012-07-16 NOTE — Assessment & Plan Note (Signed)
Supplied Aeroflow, using CPAP regularly

## 2012-07-16 NOTE — Progress Notes (Signed)
Patient ID: Dustin Spears, male   DOB: 05/30/1966, 46 y.o.   MRN: [id number] Dustin Spears [id number] 1965-12-02 07/16/2012      Progress Note-Follow Up  Subjective  Chief Complaint  Chief Complaint  Patient presents with  . Annual Exam    sports physical for race car driving    HPI  Is a 46 year old Caucasian male who is in today for followup and to have a sports physical done auto racing. He reports he feels well. Says he says he's been in 80s. He continues to have slow steady weight loss. Trying to eat well and exercise regularly. He operative complaints of pain, headache, chest pain, palpitations, shortness of breath, recent illness, fevers or chills. Is using his sleep apnea machine most nights and does feel more rested in the morning. Is tolerating his testosterone shots and does feel this is helping his energy level.  Past Medical History  Diagnosis Date  . Hyperlipidemia   . Allergy     seasonal  . Thyroid disease   . Overweight 02/26/2011  . Knee pain, right   . History of chicken pox   . SNORING 03/12/2010  . SLEEP APNEA, OBSTRUCTIVE 04/24/2010  . ALLERGIC RHINITIS 03/12/2010  . FATIGUE 03/12/2010  . Headache 03/12/2010  . Neck pain, acute 03/12/2011  . Testosterone deficiency 10/19/2011  . Tendonitis 05/19/2012  . Sports physical 07/16/2012    Past Surgical History  Procedure Date  . Bicep tendon in left arm 2000    Family History  Problem Relation Age of Onset  . Hyperlipidemia Mother   . Alcohol abuse Father     smoked  . Arthritis Father   . Lupus Paternal Grandmother   . Arthritis Maternal Grandmother   . Ulcers Maternal Grandmother   . Alzheimer's disease Maternal Grandfather     History   Social History  . Marital Status: Married    Spouse Name: N/A    Number of Children: 1  . Years of Education: N/A   Occupational History  . 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 [employer]    Social History Main Topics  . Smoking status: Never Smoker   . Smokeless tobacco:  Never Used  . Alcohol Use: Yes     occasional  . Drug Use: No  . Sexually Active: Yes   Other Topics Concern  . Not on file   Social History Narrative   Grew up in [place] to [employer] 1990    Current Outpatient Prescriptions on File Prior to Visit  Medication Sig Dispense Refill  . Calcium Citrate-Vitamin D 200-250 MG-UNIT TABS Take 1 capsule by mouth daily.  120 tablet  0  . glucosamine-chondroitin 500-400 MG tablet Take 1 tablet by mouth daily.        [place] Lanius CAPS Take 1 capsule by mouth daily.  30 capsule  0  . levothyroxine (SYNTHROID, LEVOTHROID) 50 MCG tablet Take 1 tablet (50 mcg total) by mouth daily.  90 tablet  2  . multivitamin (THERAGRAN) tablet Take 1 tablet by mouth daily.        Marland Kitchen NEEDLE, DISP, 22 G (B-D HYPODERMIC NEEDLE 22GX1.5") 22G X 1-1/2" MISC Use every 2 weeks with testosterone injection  25 each  2  . Syringe, Disposable, (B-D SYRINGE LUER-LOK 3CC) 3 ML MISC Use with testosterone injection every 2 weeks. 1 ml  25 each  2  . testosterone cypionate (DEPOTESTOTERONE CYPIONATE) 200 MG/ML injection Inject 1 mL (200 mg total) into the muscle every  14 (fourteen) days.  10 mL  1  . Wheat Dextrin (BENEFIBER) POWD 2 tsp po daily in liquids  730 g  0    No Known Allergies  Review of Systems  Review of Systems  Constitutional: Negative for fever and malaise/fatigue.  HENT: Negative for congestion.   Eyes: Negative for discharge.  Respiratory: Negative for shortness of breath.   Cardiovascular: Negative for chest pain, palpitations and leg swelling.  Gastrointestinal: Negative for nausea, abdominal pain and diarrhea.  Genitourinary: Negative for dysuria.  Musculoskeletal: Negative for falls.  Skin: Negative for rash.  Neurological: Negative for loss of consciousness and headaches.  Endo/Heme/Allergies: Negative for polydipsia.  Psychiatric/Behavioral: Negative for depression and suicidal ideas. The patient is not nervous/anxious and does not have insomnia.      Objective  BP 123/77  Pulse 54  Temp 98 F (36.7 C) (Temporal)  Ht 5' 8.5" (1.74 m)  Wt 219 lb 12.8 oz (99.701 kg)  BMI 32.93 kg/m2  SpO2 99%  Physical Exam  Physical Exam  Constitutional: He is oriented to person, place, and time and well-developed, well-nourished, and in no distress. No distress.  HENT:  Head: Normocephalic and atraumatic.  Eyes: Conjunctivae normal are normal.  Neck: Neck supple. No thyromegaly present.  Cardiovascular: Normal rate, regular rhythm and normal heart sounds.   No murmur heard. Pulmonary/Chest: Effort normal and breath sounds normal. No respiratory distress.  Abdominal: He exhibits no distension and no mass. There is no tenderness.  Musculoskeletal: He exhibits no edema.  Neurological: He is alert and oriented to person, place, and time.  Skin: Skin is warm.  Psychiatric: Memory, affect and judgment normal.    Lab Results  Component Value Date   TSH 1.33 04/07/2012   Lab Results  Component Value Date   WBC 6.7 04/07/2012   HGB 16.6 04/07/2012   HCT 50.0 04/07/2012   MCV 88.3 04/07/2012   PLT 235.0 04/07/2012   Lab Results  Component Value Date   CREATININE 0.9 04/07/2012   BUN 18 04/07/2012   NA 136 04/07/2012   K 4.2 04/07/2012   CL 107 04/07/2012   CO2 23 04/07/2012   Lab Results  Component Value Date   ALT 30 04/07/2012   AST 28 04/07/2012   ALKPHOS 31* 04/07/2012   BILITOT 0.7 04/07/2012   Lab Results  Component Value Date   CHOL 220* 04/07/2012   Lab Results  Component Value Date   HDL 40.00 04/07/2012   Lab Results  Component Value Date   LDLCALC 106* 09/25/2011   Lab Results  Component Value Date   TRIG 110.0 04/07/2012   Lab Results  Component Value Date   CHOLHDL 6 04/07/2012     Assessment & Plan  SLEEP APNEA, OBSTRUCTIVE Supplied Aeroflow, using CPAP regularly  Overweight Continues to have good, slow steady weight loss, is avoiding processed foods, soda and trans fats, encouraged same ongoing  efforts  Testosterone deficiency Adequately treated at this time, continue shots every other week for now  Hyperlipidemia Mild, continue MegaRed and avoid trans fats.  FATIGUE Feeling better with tesoterone treatment and weight loss  Sports physical Patient doing well and form filled out for auto racing today

## 2012-07-29 ENCOUNTER — Other Ambulatory Visit: Payer: Self-pay

## 2012-07-29 MED ORDER — TESTOSTERONE CYPIONATE 200 MG/ML IM SOLN: 200.0000 mg | INTRAMUSCULAR | Status: DC

## 2012-07-29 NOTE — Telephone Encounter (Signed)
Please advise refill? Last RX wrote on 01-27-12 #10 ml with 1 refill.  If ok fax to [phone]

## 2012-07-29 NOTE — Telephone Encounter (Signed)
Faxed

## 2012-08-26 ENCOUNTER — Telehealth: Payer: Self-pay

## 2012-08-26 NOTE — Telephone Encounter (Signed)
Naproxen 500 mg po bid prn pain with food, #60, 3 rf

## 2012-08-26 NOTE — Telephone Encounter (Signed)
Please advise Naproxen refill

## 2012-08-27 MED ORDER — NAPROXEN 500 MG PO TABS: 500.0000 mg | ORAL_TABLET | Freq: Two times a day (BID) | ORAL | Status: DC

## 2012-10-03 ENCOUNTER — Other Ambulatory Visit: Payer: Self-pay | Admitting: Family Medicine

## 2012-10-22 ENCOUNTER — Other Ambulatory Visit (INDEPENDENT_AMBULATORY_CARE_PROVIDER_SITE_OTHER): Payer: Managed Care, Other (non HMO)

## 2012-10-22 DIAGNOSIS — E349 Endocrine disorder, unspecified: Secondary | ICD-10-CM

## 2012-10-22 DIAGNOSIS — E785 Hyperlipidemia, unspecified: Secondary | ICD-10-CM

## 2012-10-22 DIAGNOSIS — E663 Overweight: Secondary | ICD-10-CM

## 2012-10-22 DIAGNOSIS — E291 Testicular hypofunction: Secondary | ICD-10-CM

## 2012-10-22 LAB — LIPID PANEL
Cholesterol: 204 mg/dL — ABNORMAL HIGH (ref 0–200)
HDL: 35.7 mg/dL — ABNORMAL LOW (ref >39.00)
Total CHOL/HDL Ratio: 6
Triglycerides: 108 mg/dL (ref 0.0–149.0)
VLDL: 21.6 mg/dL (ref 0.0–40.0)

## 2012-10-22 LAB — CBC
Hemoglobin: 15.5 g/dL (ref 13.0–17.0)
MCHC: 33.8 g/dL (ref 30.0–36.0)
Platelets: 218 10*3/uL (ref 150.0–400.0)
WBC: 6.2 10*3/uL (ref 4.5–10.5)

## 2012-10-22 LAB — RENAL FUNCTION PANEL
CO2: 28 mEq/L (ref 19–32)
Calcium: 8.8 mg/dL (ref 8.4–10.5)
Chloride: 105 mEq/L (ref 96–112)
Creatinine, Ser: 0.9 mg/dL (ref 0.4–1.5)
GFR: 99.67 mL/min (ref >60.00)
Potassium: 3.9 mEq/L (ref 3.5–5.1)
Sodium: 138 mEq/L (ref 135–145)

## 2012-10-22 LAB — HEPATIC FUNCTION PANEL
Albumin: 4.1 g/dL (ref 3.5–5.2)
Total Bilirubin: 0.9 mg/dL (ref 0.3–1.2)

## 2012-10-22 LAB — LDL CHOLESTEROL, DIRECT: Direct LDL: 158.2 mg/dL

## 2012-10-22 LAB — TSH: TSH: 0.41 u[IU]/mL (ref 0.35–5.50)

## 2012-10-29 ENCOUNTER — Encounter: Payer: Managed Care, Other (non HMO) | Admitting: Family Medicine

## 2012-11-04 ENCOUNTER — Ambulatory Visit (INDEPENDENT_AMBULATORY_CARE_PROVIDER_SITE_OTHER): Payer: Managed Care, Other (non HMO) | Admitting: Family Medicine

## 2012-11-04 ENCOUNTER — Encounter: Payer: Self-pay | Admitting: Family Medicine

## 2012-11-04 VITALS — BP 110/72 | HR 57 | Temp 98.2°F | Ht 68.5 in | Wt 228.1 lb

## 2012-11-04 DIAGNOSIS — Z23 Encounter for immunization: Secondary | ICD-10-CM

## 2012-11-04 DIAGNOSIS — E785 Hyperlipidemia, unspecified: Secondary | ICD-10-CM

## 2012-11-04 DIAGNOSIS — R5383 Other fatigue: Secondary | ICD-10-CM

## 2012-11-04 DIAGNOSIS — G4733 Obstructive sleep apnea (adult) (pediatric): Secondary | ICD-10-CM

## 2012-11-04 DIAGNOSIS — R5381 Other malaise: Secondary | ICD-10-CM

## 2012-11-04 DIAGNOSIS — Z0000 Encounter for general adult medical examination without abnormal findings: Secondary | ICD-10-CM

## 2012-11-04 DIAGNOSIS — E663 Overweight: Secondary | ICD-10-CM

## 2012-11-04 HISTORY — DX: Encounter for general adult medical examination without abnormal findings: Z00.00

## 2012-11-04 MED ORDER — TETANUS-DIPHTH-ACELL PERTUSSIS 5-2.5-18.5 LF-MCG/0.5 IM SUSP: 0.5000 mL | Freq: Once | INTRAMUSCULAR | Status: DC

## 2012-11-04 NOTE — Assessment & Plan Note (Signed)
Encouraged DASH diet, increase exercise 

## 2012-11-04 NOTE — Assessment & Plan Note (Signed)
Given Tdap booster today, he works as a Curator, exercise, sleep and healthy diet emphasized

## 2012-11-04 NOTE — Assessment & Plan Note (Signed)
Improved, encouraged ongoing fish oil caps, avoid trans fats, increase exercise.

## 2012-11-04 NOTE — Assessment & Plan Note (Signed)
Improved with CPAP and testosterone.

## 2012-11-04 NOTE — Patient Instructions (Addendum)
For the knee try ice and Aspercreme and call if worsening  Preventive Care for Adults, Male A healthy lifestyle and preventive care can promote health and wellness. Preventive health guidelines for men include the following key practices:  A routine yearly physical is a good way to check with your caregiver about your health and preventative screening. It is a chance to share any concerns and updates on your health, and to receive a thorough exam.  Visit your dentist for a routine exam and preventative care every 6 months. Brush your teeth twice a day and floss once a day. Good oral hygiene prevents tooth decay and gum disease.  The frequency of eye exams is based on your age, health, family medical history, use of contact lenses, and other factors. Follow your caregiver's recommendations for frequency of eye exams.  Eat a healthy diet. Foods like vegetables, fruits, whole grains, low-fat dairy products, and lean protein foods contain the nutrients you need without too many calories. Decrease your intake of foods high in solid fats, added sugars, and salt. Eat the right amount of calories for you.Get information about a proper diet from your caregiver, if necessary.  Regular physical exercise is one of the most important things you can do for your health. Most adults should get at least 150 minutes of moderate-intensity exercise (any activity that increases your heart rate and causes you to sweat) each week. In addition, most adults need muscle-strengthening exercises on 2 or more days a week.  Maintain a healthy weight. The body mass index (BMI) is a screening tool to identify possible weight problems. It provides an estimate of body fat based on height and weight. Your caregiver can help determine your BMI, and can help you achieve or maintain a healthy weight.For adults 20 years and older:  A BMI below 18.5 is considered underweight.  A BMI of 18.5 to 24.9 is normal.  A BMI of 25 to 29.9 is  considered overweight.  A BMI of 30 and above is considered obese.  Maintain normal blood lipids and cholesterol levels by exercising and minimizing your intake of saturated fat. Eat a balanced diet with plenty of fruit and vegetables. Blood tests for lipids and cholesterol should begin at age 25 and be repeated every 5 years. If your lipid or cholesterol levels are high, you are over 50, or you are a high risk for heart disease, you may need your cholesterol levels checked more frequently.Ongoing high lipid and cholesterol levels should be treated with medicines if diet and exercise are not effective.  If you smoke, find out from your caregiver how to quit. If you do not use tobacco, do not start.  If you choose to drink alcohol, do not exceed 2 drinks per day. One drink is considered to be 12 ounces (355 mL) of beer, 5 ounces (148 mL) of wine, or 1.5 ounces (44 mL) of liquor.  Avoid use of street drugs. Do not share needles with anyone. Ask for help if you need support or instructions about stopping the use of drugs.  High blood pressure causes heart disease and increases the risk of stroke. Your blood pressure should be checked at least every 1 to 2 years. Ongoing high blood pressure should be treated with medicines, if weight loss and exercise are not effective.  If you are 57 to 48 years old, ask your caregiver if you should take aspirin to prevent heart disease.  Diabetes screening involves taking a blood sample to check your  fasting blood sugar level. This should be done once every 3 years, after age 66, if you are within normal weight and without risk factors for diabetes. Testing should be considered at a younger age or be carried out more frequently if you are overweight and have at least 1 risk factor for diabetes.  Colorectal cancer can be detected and often prevented. Most routine colorectal cancer screening begins at the age of 49 and continues through age 50. However, your caregiver  may recommend screening at an earlier age if you have risk factors for colon cancer. On a yearly basis, your caregiver may provide home test kits to check for hidden blood in the stool. Use of a small camera at the end of a tube, to directly examine the colon (sigmoidoscopy or colonoscopy), can detect the earliest forms of colorectal cancer. Talk to your caregiver about this at age 84, when routine screening begins. Direct examination of the colon should be repeated every 5 to 10 years through age 60, unless early forms of pre-cancerous polyps or small growths are found.  Hepatitis C blood testing is recommended for all people born from [birth year] through 1965 and any individual with known risks for hepatitis C.  Practice safe sex. Use condoms and avoid high-risk sexual practices to reduce the spread of sexually transmitted infections (STIs). STIs include gonorrhea, chlamydia, syphilis, trichomonas, herpes, HPV, and human immunodeficiency virus (HIV). Herpes, HIV, and HPV are viral illnesses that have no cure. They can result in disability, cancer, and death.  A one-time screening for abdominal aortic aneurysm (AAA) and surgical repair of large AAAs by sound wave imaging (ultrasonography) is recommended for ages 51 to 83 years who are current or former smokers.  Healthy men should no longer receive prostate-specific antigen (PSA) blood tests as part of routine cancer screening. Consult with your caregiver about prostate cancer screening.  Testicular cancer screening is not recommended for adult males who have no symptoms. Screening includes self-exam, caregiver exam, and other screening tests. Consult with your caregiver about any symptoms you have or any concerns you have about testicular cancer.  Use sunscreen with skin protection factor (SPF) of 30 or more. Apply sunscreen liberally and repeatedly throughout the day. You should seek shade when your shadow is shorter than you. Protect yourself by wearing  long sleeves, pants, a wide-brimmed hat, and sunglasses year round, whenever you are outdoors.  Once a month, do a whole body skin exam, using a mirror to look at the skin on your back. Notify your caregiver of new moles, moles that have irregular borders, moles that are larger than a pencil eraser, or moles that have changed in shape or color.  Stay current with required immunizations.  Influenza. You need a dose every fall (or winter). The composition of the flu vaccine changes each year, so being vaccinated once is not enough.  Pneumococcal polysaccharide. You need 1 to 2 doses if you smoke cigarettes or if you have certain chronic medical conditions. You need 1 dose at age 58 (or older) if you have never been vaccinated.  Tetanus, diphtheria, pertussis (Tdap, Td). Get 1 dose of Tdap vaccine if you are younger than age 44 years, are over 83 and have contact with an infant, are a Research scientist (physical sciences), or simply want to be protected from whooping cough. After that, you need a Td booster dose every 10 years. Consult your caregiver if you have not had at least 3 tetanus and diphtheria-containing shots sometime in your life or  have a deep or dirty wound.  HPV. This vaccine is recommended for males 13 through 48 years of age. This vaccine may be given to men 22 through 48 years of age who have not completed the 3 dose series. It is recommended for men through age 59 who have sex with men or whose immune system is weakened because of HIV infection, other illness, or medications. The vaccine is given in 3 doses over 6 months.  Measles, mumps, rubella (MMR). You need at least 1 dose of MMR if you were born in 1957 or later. You may also need a 2nd dose.  Meningococcal. If you are age 69 to 68 years and a Orthoptist living in a residence hall, or have one of several medical conditions, you need to get vaccinated against meningococcal disease. You may also need additional booster  doses.  Zoster (shingles). If you are age 57 years or older, you should get this vaccine.  Varicella (chickenpox). If you have never had chickenpox or you were vaccinated but received only 1 dose, talk to your caregiver to find out if you need this vaccine.  Hepatitis A. You need this vaccine if you have a specific risk factor for hepatitis A virus infection, or you simply wish to be protected from this disease. The vaccine is usually given as 2 doses, 6 to 18 months apart.  Hepatitis B. You need this vaccine if you have a specific risk factor for hepatitis B virus infection or you simply wish to be protected from this disease. The vaccine is given in 3 doses, usually over 6 months. Preventative Service / Frequency Ages 80 to 23  Blood pressure check.** / Every 1 to 2 years.  Lipid and cholesterol check.** / Every 5 years beginning at age 63.  Hepatitis C blood test.** / For any individual with known risks for hepatitis C.  Skin self-exam. / Monthly.  Influenza immunization.** / Every year.  Pneumococcal polysaccharide immunization.** / 1 to 2 doses if you smoke cigarettes or if you have certain chronic medical conditions.  Tetanus, diphtheria, pertussis (Tdap,Td) immunization. / A one-time dose of Tdap vaccine. After that, you need a Td booster dose every 10 years.  HPV immunization. / 3 doses over 6 months, if 26 and younger.  Measles, mumps, rubella (MMR) immunization. / You need at least 1 dose of MMR if you were born in 1957 or later. You may also need a 2nd dose.  Meningococcal immunization. / 1 dose if you are age 18 to 49 years and a Orthoptist living in a residence hall, or have one of several medical conditions, you need to get vaccinated against meningococcal disease. You may also need additional booster doses.  Varicella immunization.** / Consult your caregiver.  Hepatitis A immunization.** / Consult your caregiver. 2 doses, 6 to 18 months  apart.  Hepatitis B immunization.** / Consult your caregiver. 3 doses usually over 6 months. Ages 21 to 37  Blood pressure check.** / Every 1 to 2 years.  Lipid and cholesterol check.** / Every 5 years beginning at age 28.  Fecal occult blood test (FOBT) of stool. / Every year beginning at age 68 and continuing until age 40. You may not have to do this test if you get colonoscopy every 10 years.  Flexible sigmoidoscopy** or colonoscopy.** / Every 5 years for a flexible sigmoidoscopy or every 10 years for a colonoscopy beginning at age 28 and continuing until age 58.  Hepatitis C blood  test.** / For all people born from [birth year] through 1966 and any individual with known risks for hepatitis C.  Skin self-exam. / Monthly.  Influenza immunization.** / Every year.  Pneumococcal polysaccharide immunization.** / 1 to 2 doses if you smoke cigarettes or if you have certain chronic medical conditions.  Tetanus, diphtheria, pertussis (Tdap/Td) immunization.** / A one-time dose of Tdap vaccine. After that, you need a Td booster dose every 10 years.  Measles, mumps, rubella (MMR) immunization. / You need at least 1 dose of MMR if you were born in 1957 or later. You may also need a 2nd dose.  Varicella immunization.**/ Consult your caregiver.  Meningococcal immunization.** / Consult your caregiver.  Hepatitis A immunization.** / Consult your caregiver. 2 doses, 6 to 18 months apart.  Hepatitis B immunization.** / Consult your caregiver. 3 doses, usually over 6 months. Ages 50 and over  Blood pressure check.** / Every 1 to 2 years.  Lipid and cholesterol check.**/ Every 5 years beginning at age 94.  Fecal occult blood test (FOBT) of stool. / Every year beginning at age 61 and continuing until age 55. You may not have to do this test if you get colonoscopy every 10 years.  Flexible sigmoidoscopy** or colonoscopy.** / Every 5 years for a flexible sigmoidoscopy or every 10 years for a colonoscopy  beginning at age 81 and continuing until age 44.  Hepatitis C blood test.** / For all people born from [birth year] through 1966 and any individual with known risks for hepatitis C.  Abdominal aortic aneurysm (AAA) screening.** / A one-time screening for ages 48 to 79 years who are current or former smokers.  Skin self-exam. / Monthly.  Influenza immunization.** / Every year.  Pneumococcal polysaccharide immunization.** / 1 dose at age 27 (or older) if you have never been vaccinated.  Tetanus, diphtheria, pertussis (Tdap, Td) immunization. / A one-time dose of Tdap vaccine if you are over 65 and have contact with an infant, are a Research scientist (physical sciences), or simply want to be protected from whooping cough. After that, you need a Td booster dose every 10 years.  Varicella immunization. ** / Consult your caregiver.  Meningococcal immunization.** / Consult your caregiver.  Hepatitis A immunization. ** / Consult your caregiver. 2 doses, 6 to 18 months apart.  Hepatitis B immunization.** / Check with your caregiver. 3 doses, usually over 6 months. **Family history and personal history of risk and conditions may change your caregiver's recommendations. Document Released: 12/02/2001 Document Revised: 12/29/2011 Document Reviewed: 03/03/2011 [facility] Patient Information 2013 [place], [place].

## 2012-11-04 NOTE — Progress Notes (Signed)
Patient ID: Dustin Spears, male   DOB: 09-18-1966, 47 y.o.   MRN: [id number] Aldrin Engelhard [id number] 04/25/1966 11/04/2012      Progress Note-Follow Up  Subjective  Chief Complaint  Chief Complaint  Patient presents with  . Annual Exam    physical  . Injections    tdap    HPI  Patient is a 47 year old Caucasian male who is in today for annual exam. Overall he is doing well. She's not had any recent illness. No complaints of headache, congestion chest pain, palpitations, shortness of breath, GI or GU complaints. Continues to struggle with some fatigue as noted is improved with treatment testosterone. Has persistent back and joint pain but finds it tolerable is some naproxen now and again.  Past Medical History  Diagnosis Date  . Hyperlipidemia   . Allergy     seasonal  . Thyroid disease   . Overweight 02/26/2011  . Knee pain, right   . History of chicken pox   . SNORING 03/12/2010  . SLEEP APNEA, OBSTRUCTIVE 04/24/2010  . ALLERGIC RHINITIS 03/12/2010  . FATIGUE 03/12/2010  . Headache 03/12/2010  . Neck pain, acute 03/12/2011  . Testosterone deficiency 10/19/2011  . Tendonitis 05/19/2012  . Sports physical 07/16/2012  . Preventative health care 11/04/2012    Past Surgical History  Procedure Date  . Bicep tendon in left arm 2000    Family History  Problem Relation Age of Onset  . Hyperlipidemia Mother   . Alcohol abuse Father     smoked  . Arthritis Father   . Lupus Paternal Grandmother   . Arthritis Maternal Grandmother   . Ulcers Maternal Grandmother   . Alzheimer's disease Maternal Grandfather     History   Social History  . Marital Status: Married    Spouse Name: N/A    Number of Children: 1  . Years of Education: N/A   Occupational History  . 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 [employer]    Social History Main Topics  . Smoking status: Never Smoker   . Smokeless tobacco: Never Used  . Alcohol Use: Yes     Comment: occasional  . Drug Use: No  . Sexually Active: Yes    Other Topics Concern  . Not on file   Social History Narrative   Grew up in [place] to [employer] 1990    Current Outpatient Prescriptions on File Prior to Visit  Medication Sig Dispense Refill  . Calcium Citrate-Vitamin D 200-250 MG-UNIT TABS Take 1 capsule by mouth daily.  120 tablet  0  . glucosamine-chondroitin 500-400 MG tablet Take 1 tablet by mouth daily.        [facility] CAPS Take 1 capsule by mouth daily.  30 capsule  0  . levothyroxine (SYNTHROID, LEVOTHROID) 50 MCG tablet Take 1 tablet (50 mcg total) by mouth daily.  90 tablet  2  . multivitamin (THERAGRAN) tablet Take 1 tablet by mouth daily.        . naproxen (NAPROSYN) 500 MG tablet Take 1 tablet (500 mg total) by mouth 2 (two) times daily with a meal. Prn for pain. Take w/ food  60 tablet  3  . NEEDLE, DISP, 22 G (B-D HYPODERMIC NEEDLE 22GX1.5") 22G X 1-1/2" MISC Use every 2 weeks with testosterone injection  25 each  2  . Syringe, Disposable, (B-D SYRINGE LUER-LOK 3CC) 3 ML MISC Use with testosterone injection every 2 weeks. 1 ml  25 each  2  . testosterone  cypionate (DEPOTESTOTERONE CYPIONATE) 200 MG/ML injection Inject 1 mL (200 mg total) into the muscle every 14 (fourteen) days.  10 mL  1  . Wheat Dextrin (BENEFIBER) POWD 2 tsp po daily in liquids  730 g  0   No current facility-administered medications on file prior to visit.    No Known Allergies  Review of Systems  Review of Systems  Constitutional: Negative for fever and malaise/fatigue.  HENT: Negative for congestion.   Eyes: Negative for discharge.  Respiratory: Negative for shortness of breath.   Cardiovascular: Negative for chest pain, palpitations and leg swelling.  Gastrointestinal: Negative for nausea, abdominal pain and diarrhea.  Genitourinary: Negative for dysuria.  Musculoskeletal: Negative for falls.  Skin: Negative for rash.  Neurological: Negative for loss of consciousness and headaches.  Endo/Heme/Allergies: Negative for polydipsia.    Psychiatric/Behavioral: Negative for depression and suicidal ideas. The patient is not nervous/anxious and does not have insomnia.     Objective  BP 110/72  Pulse 57  Temp 98.2 F (36.8 C) (Temporal)  Ht 5' 8.5" (1.74 m)  Wt 228 lb 1.9 oz (103.475 kg)  BMI 34.18 kg/m2  SpO2 99%  Physical Exam  Physical Exam  Constitutional: He is oriented to person, place, and time and well-developed, well-nourished, and in no distress. No distress.  HENT:  Head: Normocephalic and atraumatic.  Eyes: Conjunctivae normal are normal.  Neck: Neck supple. No thyromegaly present.  Cardiovascular: Normal rate, regular rhythm and normal heart sounds.   No murmur heard. Pulmonary/Chest: Effort normal and breath sounds normal. No respiratory distress.  Abdominal: He exhibits no distension and no mass. There is no tenderness.  Musculoskeletal: He exhibits no edema.  Neurological: He is alert and oriented to person, place, and time.  Skin: Skin is warm.  Psychiatric: Memory, affect and judgment normal.    Lab Results  Component Value Date   TSH 0.41 10/22/2012   Lab Results  Component Value Date   WBC 6.2 10/22/2012   HGB 15.5 10/22/2012   HCT 45.7 10/22/2012   MCV 88.2 10/22/2012   PLT 218.0 10/22/2012   Lab Results  Component Value Date   CREATININE 0.9 10/22/2012   BUN 17 10/22/2012   NA 138 10/22/2012   K 3.9 10/22/2012   CL 105 10/22/2012   CO2 28 10/22/2012   Lab Results  Component Value Date   ALT 23 10/22/2012   AST 23 10/22/2012   ALKPHOS 31* 10/22/2012   BILITOT 0.9 10/22/2012   Lab Results  Component Value Date   CHOL 204* 10/22/2012   Lab Results  Component Value Date   HDL 35.70* 10/22/2012   Lab Results  Component Value Date   LDLCALC 106* 09/25/2011   Lab Results  Component Value Date   TRIG 108.0 10/22/2012   Lab Results  Component Value Date   CHOLHDL 6 10/22/2012     Assessment & Plan  FATIGUE Improved with CPAP and testosterone.  Hyperlipidemia Improved, encouraged ongoing fish  oil caps, avoid trans fats, increase exercise.  Overweight Encouraged DASH diet, increase exercise  SLEEP APNEA, OBSTRUCTIVE Using CPAP routinely  Preventative health care Given Tdap booster today, he works as a Curator, exercise, sleep and healthy diet emphasized

## 2012-11-04 NOTE — Assessment & Plan Note (Signed)
Using CPAP routinely 

## 2013-04-01 ENCOUNTER — Other Ambulatory Visit: Payer: Self-pay | Admitting: Family Medicine

## 2013-04-01 MED ORDER — TESTOSTERONE CYPIONATE 200 MG/ML IM SOLN: 200.0000 mg | INTRAMUSCULAR | Status: DC

## 2013-04-01 NOTE — Telephone Encounter (Signed)
Refill- testosterone cyp 2,000mg /31ml. Inject 1 ml into the muscle every 14 days. Last fill 2.1.14

## 2013-04-01 NOTE — Telephone Encounter (Signed)
Please advise refill? Last RX was done on 07-29-12 quantity 10 ml X 1   If ok fax to [phone]

## 2013-05-13 ENCOUNTER — Ambulatory Visit (INDEPENDENT_AMBULATORY_CARE_PROVIDER_SITE_OTHER): Payer: Managed Care, Other (non HMO) | Admitting: Family Medicine

## 2013-05-13 ENCOUNTER — Encounter: Payer: Self-pay | Admitting: Family Medicine

## 2013-05-13 VITALS — BP 122/82 | HR 51 | Temp 97.8°F | Ht 68.5 in | Wt 235.1 lb

## 2013-05-13 DIAGNOSIS — E291 Testicular hypofunction: Secondary | ICD-10-CM

## 2013-05-13 DIAGNOSIS — E785 Hyperlipidemia, unspecified: Secondary | ICD-10-CM

## 2013-05-13 DIAGNOSIS — G4733 Obstructive sleep apnea (adult) (pediatric): Secondary | ICD-10-CM

## 2013-05-13 DIAGNOSIS — R5381 Other malaise: Secondary | ICD-10-CM

## 2013-05-13 DIAGNOSIS — R5383 Other fatigue: Secondary | ICD-10-CM

## 2013-05-13 DIAGNOSIS — R7989 Other specified abnormal findings of blood chemistry: Secondary | ICD-10-CM

## 2013-05-13 DIAGNOSIS — E039 Hypothyroidism, unspecified: Secondary | ICD-10-CM

## 2013-05-13 DIAGNOSIS — E349 Endocrine disorder, unspecified: Secondary | ICD-10-CM

## 2013-05-13 LAB — RENAL FUNCTION PANEL
BUN: 15 mg/dL (ref 6–23)
Calcium: 8.9 mg/dL (ref 8.4–10.5)
Chloride: 106 mEq/L (ref 96–112)
Glucose, Bld: 87 mg/dL (ref 70–99)
Potassium: 4.4 mEq/L (ref 3.5–5.3)

## 2013-05-13 LAB — HEPATIC FUNCTION PANEL
ALT: 24 U/L (ref 0–53)
Alkaline Phosphatase: 30 U/L — ABNORMAL LOW (ref 39–117)
Bilirubin, Direct: 0.1 mg/dL (ref 0.0–0.3)
Indirect Bilirubin: 0.5 mg/dL (ref 0.0–0.9)

## 2013-05-13 LAB — CBC
HCT: 46.9 % (ref 39.0–52.0)
Hemoglobin: 15.9 g/dL (ref 13.0–17.0)
MCH: 29.7 pg (ref 26.0–34.0)
MCHC: 33.9 g/dL (ref 30.0–36.0)

## 2013-05-13 LAB — LIPID PANEL
HDL: 39 mg/dL — ABNORMAL LOW (ref >39)
Total CHOL/HDL Ratio: 5.1 Ratio

## 2013-05-13 NOTE — Patient Instructions (Addendum)
Try Salon Pas patches and or cream  Arthritis, Nonspecific Arthritis is inflammation of a joint. This usually means pain, redness, warmth or swelling are present. One or more joints may be involved. There are a number of types of arthritis. Your caregiver may not be able to tell what type of arthritis you have right away. CAUSES  The most common cause of arthritis is the wear and tear on the joint (osteoarthritis). This causes damage to the cartilage, which can break down over time. The knees, hips, back and neck are most often affected by this type of arthritis. Other types of arthritis and common causes of joint pain include:  Sprains and other injuries near the joint. Sometimes minor sprains and injuries cause pain and swelling that develop hours later.  Rheumatoid arthritis. This affects hands, feet and knees. It usually affects both sides of your body at the same time. It is often associated with chronic ailments, fever, weight loss and general weakness.  Crystal arthritis. Gout and pseudo gout can cause occasional acute severe pain, redness and swelling in the foot, ankle, or knee.  Infectious arthritis. Bacteria can get into a joint through a break in overlying skin. This can cause infection of the joint. Bacteria and viruses can also spread through the blood and affect your joints.  Drug, infectious and allergy reactions. Sometimes joints can become mildly painful and slightly swollen with these types of illnesses. SYMPTOMS   Pain is the main symptom.  Your joint or joints can also be red, swollen and warm or hot to the touch.  You may have a fever with certain types of arthritis, or even feel overall ill.  The joint with arthritis will hurt with movement. Stiffness is present with some types of arthritis. DIAGNOSIS  Your caregiver will suspect arthritis based on your description of your symptoms and on your exam. Testing may be needed to find the type of arthritis:  Blood and  sometimes urine tests.  X-ray tests and sometimes CT or MRI scans.  Removal of fluid from the joint (arthrocentesis) is done to check for bacteria, crystals or other causes. Your caregiver (or a specialist) will numb the area over the joint with a local anesthetic, and use a needle to remove joint fluid for examination. This procedure is only minimally uncomfortable.  Even with these tests, your caregiver may not be able to tell what kind of arthritis you have. Consultation with a specialist (rheumatologist) may be helpful. TREATMENT  Your caregiver will discuss with you treatment specific to your type of arthritis. If the specific type cannot be determined, then the following general recommendations may apply. Treatment of severe joint pain includes:  Rest.  Elevation.  Anti-inflammatory medication (for example, ibuprofen) may be prescribed. Avoiding activities that cause increased pain.  Only take over-the-counter or prescription medicines for pain and discomfort as recommended by your caregiver.  Cold packs over an inflamed joint may be used for 10 to 15 minutes every hour. Hot packs sometimes feel better, but do not use overnight. Do not use hot packs if you are diabetic without your caregiver's permission.  A cortisone shot into arthritic joints may help reduce pain and swelling.  Any acute arthritis that gets worse over the next 1 to 2 days needs to be looked at to be sure there is no joint infection. Long-term arthritis treatment involves modifying activities and lifestyle to reduce joint stress jarring. This can include weight loss. Also, exercise is needed to nourish the joint cartilage  and remove waste. This helps keep the muscles around the joint strong. HOME CARE INSTRUCTIONS   Do not take aspirin to relieve pain if gout is suspected. This elevates uric acid levels.  Only take over-the-counter or prescription medicines for pain, discomfort or fever as directed by your  caregiver.  Rest the joint as much as possible.  If your joint is swollen, keep it elevated.  Use crutches if the painful joint is in your leg.  Drinking plenty of fluids may help for certain types of arthritis.  Follow your caregiver's dietary instructions.  Try low-impact exercise such as:  Swimming.  Water aerobics.  Biking.  Walking.  Morning stiffness is often relieved by a warm shower.  Put your joints through regular range-of-motion. SEEK MEDICAL CARE IF:   You do not feel better in 24 hours or are getting worse.  You have side effects to medications, or are not getting better with treatment. SEEK IMMEDIATE MEDICAL CARE IF:   You have a fever.  You develop severe joint pain, swelling or redness.  Many joints are involved and become painful and swollen.  There is severe back pain and/or leg weakness.  You have loss of bowel or bladder control. Document Released: 11/13/2004 Document Revised: 12/29/2011 Document Reviewed: 11/29/2008 [facility] Patient Information 2014 [place], [place].

## 2013-05-13 NOTE — Progress Notes (Signed)
Patient ID: Dustin Spears, male   DOB: 10-04-1966, 47 y.o.   MRN: [id number] . Thierno Hun [id number] 1966-03-06 05/13/2013      Progress Note-Follow Up  Subjective  Chief Complaint  Chief Complaint  Patient presents with  . Follow-up    6 month    HPI  Patient is a 47 year old Caucasian male who is in today for followup. His major complaint is fatigue. He says uses his CPAP is still feels very tired each day. Denies any recent illness. Denies any headache or other neurologic complaints. Denies any chest pain, adjacent, shortness of breath, GI or GU concerns at this time. He is having a harder time arranging for his testosterone injections at this time,   Past Medical History  Diagnosis Date  . Hyperlipidemia   . Allergy     seasonal  . Thyroid disease   . Overweight(278.02) 02/26/2011  . Knee pain, right   . History of chicken pox   . SNORING 03/12/2010  . SLEEP APNEA, OBSTRUCTIVE 04/24/2010  . ALLERGIC RHINITIS 03/12/2010  . FATIGUE 03/12/2010  . Headache(784.0) 03/12/2010  . Neck pain, acute 03/12/2011  . Testosterone deficiency 10/19/2011  . Tendonitis 05/19/2012  . Sports physical 07/16/2012  . Preventative health care 11/04/2012    Past Surgical History  Procedure Laterality Date  . Bicep tendon in left arm  2000    Family History  Problem Relation Age of Onset  . Hyperlipidemia Mother   . Alcohol abuse Father     smoked  . Arthritis Father   . Lupus Paternal Grandmother   . Arthritis Maternal Grandmother   . Ulcers Maternal Grandmother   . Alzheimer's disease Maternal Grandfather     History   Social History  . Marital Status: Married    Spouse Name: N/A    Number of Children: 1  . Years of Education: N/A   Occupational History  . 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 [employer]    Social History Main Topics  . Smoking status: Never Smoker   . Smokeless tobacco: Never Used  . Alcohol Use: Yes     Comment: occasional  . Drug Use: No  . Sexually Active: Yes    Other Topics Concern  . Not on file   Social History Narrative   Grew up in [place] to [employer] [place]    Current Outpatient Prescriptions on File Prior to Visit  Medication Sig Dispense Refill  . Calcium Citrate-Vitamin D 200-250 MG-UNIT TABS Take 1 capsule by mouth daily.  120 tablet  0  . glucosamine-chondroitin 500-400 MG tablet Take 1 tablet by mouth daily.        [facility] levothyroxine (SYNTHROID, LEVOTHROID) 50 MCG tablet TAKE 1 TABLET BY MOUTH EVERY DAY  90 tablet  1  . naproxen (NAPROSYN) 500 MG tablet Take 1 tablet (500 mg total) by mouth 2 (two) times daily with a meal. Prn for pain. Take w/ food  60 tablet  3  . NEEDLE, DISP, 22 G (B-D HYPODERMIC NEEDLE 22GX1.5") 22G X 1-1/2" MISC Use every 2 weeks with testosterone injection  25 each  2  . Syringe, Disposable, (B-D SYRINGE LUER-LOK 3CC) 3 ML MISC Use with testosterone injection every 2 weeks. 1 ml  25 each  2  . testosterone cypionate (DEPOTESTOTERONE CYPIONATE) 200 MG/ML injection Inject 1 mL (200 mg total) into the muscle every 14 (fourteen) days.  10 mL  1  . Wheat Dextrin (BENEFIBER) POWD 2 tsp po daily  in liquids  730 g  0   No current facility-administered medications on file prior to visit.    No Known Allergies  Review of Systems  Review of Systems  Constitutional: Negative for fever and malaise/fatigue.  HENT: Negative for congestion.   Eyes: Negative for pain and discharge.  Respiratory: Negative for shortness of breath.   Cardiovascular: Negative for chest pain, palpitations and leg swelling.  Gastrointestinal: Negative for nausea, abdominal pain and diarrhea.  Genitourinary: Negative for dysuria.  Musculoskeletal: Negative for falls.  Skin: Negative for rash.  Neurological: Negative for loss of consciousness and headaches.  Endo/Heme/Allergies: Negative for polydipsia.  Psychiatric/Behavioral: Negative for depression and suicidal ideas. The patient is not nervous/anxious and does not have insomnia.      Objective  BP 122/82  Pulse 51  Temp(Src) 97.8 F (36.6 C) (Oral)  Ht 5' 8.5" (1.74 m)  Wt 235 lb 1.9 oz (106.65 kg)  BMI 35.23 kg/m2  SpO2 97%  Physical Exam  Physical Exam  Constitutional: He is oriented to person, place, and time and well-developed, well-nourished, and in no distress. No distress.  HENT:  Head: Normocephalic and atraumatic.  Eyes: Conjunctivae are normal.  Neck: Neck supple. No thyromegaly present.  Cardiovascular: Normal rate, regular rhythm and normal heart sounds.   No murmur heard. Pulmonary/Chest: Effort normal. No respiratory distress. He has no wheezes.  Abdominal: He exhibits no distension and no mass. There is no tenderness.  Musculoskeletal: He exhibits no edema.  Neurological: He is alert and oriented to person, place, and time.  Skin: Skin is warm.  Psychiatric: Memory, affect and judgment normal.    Lab Results  Component Value Date   TSH 0.41 10/22/2012   Lab Results  Component Value Date   WBC 6.2 10/22/2012   HGB 15.5 10/22/2012   HCT 45.7 10/22/2012   MCV 88.2 10/22/2012   PLT 218.0 10/22/2012   Lab Results  Component Value Date   CREATININE 0.9 10/22/2012   BUN 17 10/22/2012   NA 138 10/22/2012   K 3.9 10/22/2012   CL 105 10/22/2012   CO2 28 10/22/2012   Lab Results  Component Value Date   ALT 23 10/22/2012   AST 23 10/22/2012   ALKPHOS 31* 10/22/2012   BILITOT 0.9 10/22/2012   Lab Results  Component Value Date   CHOL 204* 10/22/2012   Lab Results  Component Value Date   HDL 35.70* 10/22/2012   Lab Results  Component Value Date   LDLCALC 106* 09/25/2011   Lab Results  Component Value Date   TRIG 108.0 10/22/2012   Lab Results  Component Value Date   CHOLHDL 6 10/22/2012     Assessment & Plan  HYPOTHYROIDISM Well treated no changes  Hyperlipidemia Avoid trans fats, increase exercise, add krill oil caps.   Testosterone deficiency Well treated, no changes but can consider topical treatments if he would like to changes if he would  like.   SLEEP APNEA, OBSTRUCTIVE Using CPAP routinely  FATIGUE Persistent, encouraged weight loss and increased exercise

## 2013-05-17 ENCOUNTER — Encounter: Payer: Self-pay | Admitting: Family Medicine

## 2013-05-17 NOTE — Assessment & Plan Note (Signed)
Well treated, no changes but can consider topical treatments if he would like to changes if he would like.

## 2013-05-17 NOTE — Assessment & Plan Note (Signed)
Well treated no changes 

## 2013-05-17 NOTE — Assessment & Plan Note (Signed)
Avoid trans fats, increase exercise, add krill oil caps 

## 2013-05-17 NOTE — Assessment & Plan Note (Signed)
Using CPAP routinely 

## 2013-05-17 NOTE — Assessment & Plan Note (Signed)
Persistent, encouraged weight loss and increased exercise

## 2013-08-25 ENCOUNTER — Other Ambulatory Visit: Payer: Self-pay

## 2013-09-24 ENCOUNTER — Other Ambulatory Visit: Payer: Self-pay | Admitting: Family Medicine

## 2013-09-30 ENCOUNTER — Telehealth: Payer: Self-pay | Admitting: Family Medicine

## 2013-09-30 MED ORDER — NAPROXEN 500 MG PO TABS: 500.0000 mg | ORAL_TABLET | Freq: Two times a day (BID) | ORAL | Status: DC

## 2013-09-30 NOTE — Telephone Encounter (Signed)
OK to rf Naproxen, same strength, same sig same number with 1 refill

## 2013-09-30 NOTE — Telephone Encounter (Signed)
Refill naproxen 500 mg take 1 tablet by mouth twice a day as needed for pain with a meal last fill 12-19-12

## 2013-09-30 NOTE — Telephone Encounter (Signed)
done

## 2013-12-12 ENCOUNTER — Telehealth: Payer: Self-pay

## 2013-12-12 ENCOUNTER — Other Ambulatory Visit: Payer: Self-pay | Admitting: Family Medicine

## 2013-12-12 DIAGNOSIS — E349 Endocrine disorder, unspecified: Secondary | ICD-10-CM

## 2013-12-12 MED ORDER — TESTOSTERONE CYPIONATE 200 MG/ML IM SOLN: 200.0000 mg | INTRAMUSCULAR | Status: DC

## 2013-12-12 NOTE — Telephone Encounter (Signed)
Refill- testosterone cyp

## 2013-12-12 NOTE — Telephone Encounter (Signed)
Please advise refill? Last RX was done on 04-01-13, for 10 ml with 1 refill  If ok fax to [phone]

## 2013-12-12 NOTE — Telephone Encounter (Signed)
RX faxed.  Judeth CornfieldStephanie please call and inform pt that he should get his testosterone checked in the next couple of months for any addt refills to be given.  I already put the order in

## 2013-12-12 NOTE — Telephone Encounter (Signed)
I will allow 1 refill ( ) bottle but needs his testosterone checked to get more

## 2013-12-12 NOTE — Telephone Encounter (Signed)
Testosterone order placed.

## 2013-12-14 NOTE — Telephone Encounter (Signed)
Left message for patient to return my call.

## 2013-12-16 NOTE — Telephone Encounter (Signed)
Left message for patient to return my call.

## 2013-12-20 NOTE — Telephone Encounter (Signed)
Appointment scheduled for 01/09/14 °

## 2013-12-27 ENCOUNTER — Encounter: Payer: Self-pay | Admitting: Family Medicine

## 2013-12-27 DIAGNOSIS — E785 Hyperlipidemia, unspecified: Secondary | ICD-10-CM

## 2013-12-27 DIAGNOSIS — E039 Hypothyroidism, unspecified: Secondary | ICD-10-CM

## 2013-12-27 DIAGNOSIS — Z0000 Encounter for general adult medical examination without abnormal findings: Secondary | ICD-10-CM

## 2013-12-27 NOTE — Telephone Encounter (Signed)
Please advise? All i see is testosterone in April?

## 2013-12-28 NOTE — Telephone Encounter (Signed)
Dr Abner GreenspanBlyth,  I see a testosterone order for 02/01/14. Will the pt need labs other than this?

## 2013-12-29 ENCOUNTER — Telehealth: Payer: Self-pay

## 2013-12-29 NOTE — Telephone Encounter (Signed)
Opened in error

## 2013-12-30 LAB — RENAL FUNCTION PANEL
Albumin: 4.5 g/dL (ref 3.5–5.2)
BUN: 16 mg/dL (ref 6–23)
CO2: 23 mEq/L (ref 19–32)
CREATININE: 0.84 mg/dL (ref 0.50–1.35)
Calcium: 9.8 mg/dL (ref 8.4–10.5)
Chloride: 102 mEq/L (ref 96–112)
Glucose, Bld: 72 mg/dL (ref 70–99)
Phosphorus: 2.7 mg/dL (ref 2.3–4.6)
Potassium: 4.1 mEq/L (ref 3.5–5.3)
Sodium: 136 mEq/L (ref 135–145)

## 2013-12-30 LAB — CBC
HCT: 46.5 % (ref 39.0–52.0)
Hemoglobin: 16.3 g/dL (ref 13.0–17.0)
MCH: 29.8 pg (ref 26.0–34.0)
MCHC: 35.1 g/dL (ref 30.0–36.0)
MCV: 85 fL (ref 78.0–100.0)
Platelets: 254 10*3/uL (ref 150–400)
RBC: 5.47 MIL/uL (ref 4.22–5.81)
RDW: 13.2 % (ref 11.5–15.5)
WBC: 8.3 10*3/uL (ref 4.0–10.5)

## 2013-12-30 LAB — LIPID PANEL
CHOL/HDL RATIO: 6 ratio
CHOLESTEROL: 222 mg/dL — AB (ref 0–200)
HDL: 37 mg/dL — ABNORMAL LOW (ref >39)
LDL Cholesterol: 157 mg/dL — ABNORMAL HIGH (ref 0–99)
Triglycerides: 138 mg/dL (ref <150)
VLDL: 28 mg/dL (ref 0–40)

## 2013-12-30 LAB — HEPATIC FUNCTION PANEL
ALT: 32 U/L (ref 0–53)
AST: 31 U/L (ref 0–37)
Albumin: 4.5 g/dL (ref 3.5–5.2)
Alkaline Phosphatase: 27 U/L — ABNORMAL LOW (ref 39–117)
BILIRUBIN TOTAL: 0.8 mg/dL (ref 0.2–1.2)
Bilirubin, Direct: 0.1 mg/dL (ref 0.0–0.3)
Indirect Bilirubin: 0.7 mg/dL (ref 0.2–1.2)
Total Protein: 7.2 g/dL (ref 6.0–8.3)

## 2013-12-31 LAB — TSH: TSH: 1.805 u[IU]/mL (ref 0.350–4.500)

## 2014-01-02 ENCOUNTER — Encounter: Payer: Self-pay | Admitting: Family Medicine

## 2014-01-02 LAB — TESTOSTERONE, FREE, TOTAL, SHBG
Sex Hormone Binding: 21 nmol/L (ref 13–71)
TESTOSTERONE-% FREE: 3.1 % — AB (ref 1.6–2.9)
Testosterone, Free: 402.9 pg/mL — ABNORMAL HIGH (ref 47.0–244.0)
Testosterone: 1298 ng/dL — ABNORMAL HIGH (ref 300–890)

## 2014-01-09 ENCOUNTER — Ambulatory Visit (INDEPENDENT_AMBULATORY_CARE_PROVIDER_SITE_OTHER): Payer: Managed Care, Other (non HMO) | Admitting: Family Medicine

## 2014-01-09 ENCOUNTER — Encounter: Payer: Self-pay | Admitting: Family Medicine

## 2014-01-09 VITALS — BP 108/72 | HR 55 | Temp 97.9°F | Ht 68.5 in | Wt 241.0 lb

## 2014-01-09 DIAGNOSIS — E349 Endocrine disorder, unspecified: Secondary | ICD-10-CM

## 2014-01-09 DIAGNOSIS — E291 Testicular hypofunction: Secondary | ICD-10-CM

## 2014-01-09 DIAGNOSIS — L989 Disorder of the skin and subcutaneous tissue, unspecified: Secondary | ICD-10-CM

## 2014-01-09 DIAGNOSIS — E039 Hypothyroidism, unspecified: Secondary | ICD-10-CM

## 2014-01-09 DIAGNOSIS — M779 Enthesopathy, unspecified: Secondary | ICD-10-CM

## 2014-01-09 DIAGNOSIS — E785 Hyperlipidemia, unspecified: Secondary | ICD-10-CM

## 2014-01-09 NOTE — Patient Instructions (Addendum)
Moist heat and stretching to shoulders Ice and aspercreme to elbow as needed and call if worse   Cholesterol Cholesterol is a white, waxy, fat-like protein needed by your body in small amounts. The liver makes all the cholesterol you need. It is carried from the liver by the blood through the blood vessels. Deposits (plaque) may build up on blood vessel walls. This makes the arteries narrower and stiffer. Plaque increases the risk for heart attack and stroke. You cannot feel your cholesterol level even if it is very high. The only way to know is by a blood test to check your lipid (fats) levels. Once you know your cholesterol levels, you should keep a record of the test results. Work with your caregiver to to keep your levels in the desired range. WHAT THE RESULTS MEAN:  Total cholesterol is a rough measure of all the cholesterol in your blood.  LDL is the so-called bad cholesterol. This is the type that deposits cholesterol in the walls of the arteries. You want this level to be low.  HDL is the good cholesterol because it cleans the arteries and carries the LDL away. You want this level to be high.  Triglycerides are fat that the body can either burn for energy or store. High levels are closely linked to heart disease. DESIRED LEVELS:  Total cholesterol below 200.  LDL below 100 for people at risk, below 70 for very high risk.  HDL above 50 is good, above 60 is best.  Triglycerides below 150. HOW TO LOWER YOUR CHOLESTEROL:  Diet.  Choose fish or white meat chicken and [place], roasted or baked. Limit fatty cuts of red meat, fried foods, and processed meats, such as sausage and lunch meat.  Eat lots of fresh fruits and vegetables. Choose whole grains, beans, pasta, potatoes and cereals.  Use only small amounts of olive, corn or canola oils. Avoid butter, mayonnaise, shortening or palm kernel oils. Avoid foods with trans-fats.  Use skim/nonfat milk and low-fat/nonfat yogurt and  cheeses. Avoid whole milk, cream, ice cream, egg yolks and cheeses. Healthy desserts include angel food cake, ginger snaps, animal crackers, hard candy, popsicles, and low-fat/nonfat frozen yogurt. Avoid pastries, cakes, pies and cookies.  Exercise.  A regular program helps decrease LDL and raises HDL.  Helps with weight control.  Do things that increase your activity level like gardening, walking, or taking the stairs.  Medication.  May be prescribed by your caregiver to help lowering cholesterol and the risk for heart disease.  You may need medicine even if your levels are normal if you have several risk factors. HOME CARE INSTRUCTIONS   Follow your diet and exercise programs as suggested by your caregiver.  Take medications as directed.  Have blood work done when your caregiver feels it is necessary. MAKE SURE YOU:   Understand these instructions.  Will watch your condition.  Will get help right away if you are not doing well or get worse. Document Released: 07/01/2001 Document Revised: 12/29/2011 Document Reviewed: 07/20/2013 [facility] Patient Information 2014 [place], [place].

## 2014-01-09 NOTE — Progress Notes (Signed)
Patient ID: Dustin Spears, male   DOB: 12-21-1965, 48 y.o.   MRN: [id number] Dustin Spears [id number] 12-21-1965 01/09/2014      Progress Note-Follow Up  Subjective  Chief Complaint  Chief Complaint  Patient presents with  . Follow-up    8 month    HPI  Patient is a 48 year old male in today for routine medical care. He is generally probably offer some concerns. He is noting some intermittent right elbow pain. He says at times it has been sharp and 8/10 but is presently somewhat better. He is also complaining of a lesion on his head for a couple of years now but he does think it's more symptomatic and mildly itchy and scaly. He denies any recent illness. Continues to struggle with fatigue. Denies CP/palp/SOB/HA/congestion/fevers/GI or GU c/o. Taking meds as prescribed  Past Medical History  Diagnosis Date  . Hyperlipidemia   . Allergy     seasonal  . Thyroid disease   . Overweight 02/26/2011  . Knee pain, right   . History of chicken pox   . SNORING 03/12/2010  . SLEEP APNEA, OBSTRUCTIVE 04/24/2010  . ALLERGIC RHINITIS 03/12/2010  . FATIGUE 03/12/2010  . Headache(784.0) 03/12/2010  . Neck pain, acute 03/12/2011  . Testosterone deficiency 10/19/2011  . Tendonitis 05/19/2012  . Sports physical 07/16/2012  . Preventative health care 11/04/2012    Past Surgical History  Procedure Laterality Date  . Bicep tendon in left arm  2000    Family History  Problem Relation Age of Onset  . Hyperlipidemia Mother   . Alcohol abuse Father     smoked  . Arthritis Father   . Lupus Paternal Grandmother   . Arthritis Maternal Grandmother   . Ulcers Maternal Grandmother   . Alzheimer's disease Maternal Grandfather     History   Social History  . Marital Status: Married    Spouse Name: N/A    Number of Children: 1  . Years of Education: N/A   Occupational History  . 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 [employer]    Social History Main Topics  . Smoking status: Never Smoker   . Smokeless tobacco:  Never Used  . Alcohol Use: Yes     Comment: occasional  . Drug Use: No  . Sexual Activity: Yes   Other Topics Concern  . Not on file   Social History Narrative   Grew up in [place]   Moved to [place] 1990    Current Outpatient Prescriptions on File Prior to Visit  Medication Sig Dispense Refill  . B-D 3CC LUER-LOK SYR 23GX1-1/2 23G X 1-1/2" 3 ML MISC USE EVERY 2 WEEKS WITH TESTOSTERONE INJECTION  25 each  1  . Calcium Citrate-Vitamin D 200-250 MG-UNIT TABS Take 1 capsule by mouth daily.  120 tablet  0  . fish oil-omega-3 fatty acids 1000 MG capsule Take 2 g by mouth daily.      Marland Kitchen. glucosamine-chondroitin 500-400 MG tablet Take 1 tablet by mouth daily.        Marland Kitchen. levothyroxine (SYNTHROID, LEVOTHROID) 50 MCG tablet TAKE 1 TABLET BY MOUTH EVERY DAY  90 tablet  1  . naproxen (NAPROSYN) 500 MG tablet Take 1 tablet (500 mg total) by mouth 2 (two) times daily with a meal. Prn for pain. Take w/ food  60 tablet  1  . Syringe, Disposable, (B-D SYRINGE LUER-LOK 3CC) 3 ML MISC Use with testosterone injection every 2 weeks. 1 ml  25 each  2  .  testosterone cypionate (DEPOTESTOTERONE CYPIONATE) 200 MG/ML injection Inject 1 mL (200 mg total) into the muscle every 14 (fourteen) days.  10 mL  0  . Wheat Dextrin (BENEFIBER) POWD 2 tsp po daily in liquids  730 g  0   No current facility-administered medications on file prior to visit.    No Known Allergies  Review of Systems  Review of Systems  Constitutional: Negative for fever and malaise/fatigue.  HENT: Negative for congestion.   Eyes: Negative for discharge.  Respiratory: Negative for shortness of breath.   Cardiovascular: Negative for chest pain, palpitations and leg swelling.  Gastrointestinal: Negative for nausea, abdominal pain and diarrhea.  Genitourinary: Negative for dysuria.  Musculoskeletal: Negative for falls.  Skin: Negative for rash.  Neurological: Negative for loss of consciousness and headaches.  Endo/Heme/Allergies: Negative for  polydipsia.  Psychiatric/Behavioral: Negative for depression and suicidal ideas. The patient is not nervous/anxious and does not have insomnia.     Objective  BP 108/72  Pulse 55  Temp(Src) 97.9 F (36.6 C) (Oral)  Ht 5' 8.5" (1.74 m)  Wt 241 lb (109.317 kg)  BMI 36.11 kg/m2  SpO2 97%  Physical Exam  Physical Exam  Constitutional: He is oriented to person, place, and time and well-developed, well-nourished, and in no distress. No distress.  HENT:  Head: Normocephalic and atraumatic.  Eyes: Conjunctivae are normal.  Neck: Neck supple. No thyromegaly present.  Cardiovascular: Normal rate, regular rhythm and normal heart sounds.   No murmur heard. Pulmonary/Chest: Effort normal and breath sounds normal. No respiratory distress.  Abdominal: He exhibits no distension and no mass. There is no tenderness.  Musculoskeletal: He exhibits no edema.  Neurological: He is alert and oriented to person, place, and time.  Skin: Skin is warm.  Psychiatric: Memory, affect and judgment normal.    Lab Results  Component Value Date   TSH 1.805 12/29/2013   Lab Results  Component Value Date   WBC 8.3 12/29/2013   HGB 16.3 12/29/2013   HCT 46.5 12/29/2013   MCV 85.0 12/29/2013   PLT 254 12/29/2013   Lab Results  Component Value Date   CREATININE 0.84 12/29/2013   BUN 16 12/29/2013   NA 136 12/29/2013   K 4.1 12/29/2013   CL 102 12/29/2013   CO2 23 12/29/2013   Lab Results  Component Value Date   ALT 32 12/29/2013   AST 31 12/29/2013   ALKPHOS 27* 12/29/2013   BILITOT 0.8 12/29/2013   Lab Results  Component Value Date   CHOL 222* 12/29/2013   Lab Results  Component Value Date   HDL 37* 12/29/2013   Lab Results  Component Value Date   LDLCALC 157* 12/29/2013   Lab Results  Component Value Date   TRIG 138 12/29/2013   Lab Results  Component Value Date   CHOLHDL 6.0 12/29/2013     Assessment & Plan  HYPOTHYROIDISM On Levothyroxine, continue to monitor  Testosterone  deficiency Took his shot the day before testing. Will not change doses today. Will recheck imbetween dosing in 3 months.  Hyperlipidemia Encouraged heart healthy diet, increase exercise, avoid trans fats, consider a krill oil cap daily  Skin lesion Lesion on scalp and sun damage, referred to dermatology for consideration.  Tendonitis Struggling with right elbow pain intermittently somewhat improved now. Consider referral in future if worsens.

## 2014-01-09 NOTE — Progress Notes (Signed)
Pre visit review using our clinic review tool, if applicable. No additional management support is needed unless otherwise documented below in the visit note. 

## 2014-01-14 DIAGNOSIS — L989 Disorder of the skin and subcutaneous tissue, unspecified: Secondary | ICD-10-CM | POA: Insufficient documentation

## 2014-01-14 NOTE — Assessment & Plan Note (Signed)
Struggling with right elbow pain intermittently somewhat improved now. Consider referral in future if worsens.

## 2014-01-14 NOTE — Assessment & Plan Note (Signed)
Took his shot the day before testing. Will not change doses today. Will recheck imbetween dosing in 3 months.

## 2014-01-14 NOTE — Assessment & Plan Note (Signed)
Lesion on scalp and sun damage, referred to dermatology for consideration.

## 2014-01-14 NOTE — Assessment & Plan Note (Signed)
Encouraged heart healthy diet, increase exercise, avoid trans fats, consider a krill oil cap daily 

## 2014-01-14 NOTE — Assessment & Plan Note (Signed)
On Levothyroxine, continue to monitor 

## 2014-01-31 ENCOUNTER — Ambulatory Visit (INDEPENDENT_AMBULATORY_CARE_PROVIDER_SITE_OTHER): Payer: Managed Care, Other (non HMO) | Admitting: Family Medicine

## 2014-01-31 ENCOUNTER — Encounter: Payer: Self-pay | Admitting: Family Medicine

## 2014-01-31 VITALS — BP 122/78 | HR 65 | Temp 98.1°F | Ht 68.5 in | Wt 238.0 lb

## 2014-01-31 DIAGNOSIS — H6690 Otitis media, unspecified, unspecified ear: Secondary | ICD-10-CM

## 2014-01-31 DIAGNOSIS — T7840XA Allergy, unspecified, initial encounter: Secondary | ICD-10-CM

## 2014-01-31 DIAGNOSIS — J309 Allergic rhinitis, unspecified: Secondary | ICD-10-CM

## 2014-01-31 MED ORDER — MONTELUKAST SODIUM 10 MG PO TABS: 10.0000 mg | ORAL_TABLET | Freq: Every day | ORAL | Status: DC

## 2014-01-31 MED ORDER — AMOXICILLIN 500 MG PO CAPS: 500.0000 mg | ORAL_CAPSULE | Freq: Three times a day (TID) | ORAL | Status: DC

## 2014-01-31 NOTE — Progress Notes (Signed)
Pre visit review using our clinic review tool, if applicable. No additional management support is needed unless otherwise documented below in the visit note. 

## 2014-01-31 NOTE — Patient Instructions (Signed)
Zyrtec twice a day Mucinex 600 mg twice a day Probiotics daily, digestive advantage Increase hydration   Otitis Media, Adult Otitis media is redness, soreness, and swelling (inflammation) of the middle ear. Otitis media may be caused by allergies or, most commonly, by infection. Often it occurs as a complication of the common cold. SIGNS AND SYMPTOMS Symptoms of otitis media may include:  Earache.  Fever.  Ringing in your ear.  Headache.  Leakage of fluid from the ear. DIAGNOSIS To diagnose otitis media, your health care provider will examine your ear with an otoscope. This is an instrument that allows your health care provider to see into your ear in order to examine your eardrum. Your health care provider also will ask you questions about your symptoms. TREATMENT  Typically, otitis media resolves on its own within 3 5 days. Your health care provider may prescribe medicine to ease your symptoms of pain. If otitis media does not resolve within 5 days or is recurrent, your health care provider may prescribe antibiotic medicines if he or she suspects that a bacterial infection is the cause. HOME CARE INSTRUCTIONS   Take your medicine as directed until it is gone, even if you feel better after the first few days.  Only take over-the-counter or prescription medicines for pain, discomfort, or fever as directed by your health care provider.  Follow up with your health care provider as directed. SEEK MEDICAL CARE IF:  You have otitis media only in one ear or bleeding from your nose or both.  You notice a lump on your neck.  You are not getting better in 3 5 days.  You feel worse instead of better. SEEK IMMEDIATE MEDICAL CARE IF:   You have pain that is not controlled with medicine.  You have swelling, redness, or pain around your ear or stiffness in your neck.  You notice that part of your face is paralyzed.  You notice that the bone behind your ear (mastoid) is tender when  you touch it. MAKE SURE YOU:   Understand these instructions.  Will watch your condition.  Will get help right away if you are not doing well or get worse. Document Released: 07/11/2004 Document Revised: 07/27/2013 Document Reviewed: 05/03/2013 [facility] Patient Information 2014 [place], [place].

## 2014-02-05 DIAGNOSIS — H6690 Otitis media, unspecified, unspecified ear: Secondary | ICD-10-CM | POA: Insufficient documentation

## 2014-02-05 NOTE — Assessment & Plan Note (Signed)
Started on Amoxicillin and probiotics 

## 2014-02-05 NOTE — Assessment & Plan Note (Signed)
Will add Singulair to Zyrtec and nasal steroids.

## 2014-02-05 NOTE — Progress Notes (Signed)
Patient ID: Dustin Spears, male   DOB: [birth year], 48 y.o.   MRN: [id number] Dustin Spears [id number] [birth year] 02/05/2014      Progress Note-Follow Up  Subjective  Chief Complaint  Chief Complaint  Patient presents with  . Otalgia    not really ear pain X 5 days, both ears feel full won't "pop"  . Allergies    HPI  Patient is a 48 year old male in today for routine medical care. He is in today complaining of 5 days worth of worsening congestion. He's been struggling with allergies and in the last 5-7 days they've gotten worse. He's used Tylenol Sudafed some improvement. He has been using Nasacort, sinus and Zyrtec with minimal improvement. He's been feeling flushed and fatigue. He's complaining of popping and muffled sensation in his ears as well as some discomfort. Denies CP/palp/SOB/GI or GU c/o. Taking meds as prescribed  Past Medical History  Diagnosis Date  . Hyperlipidemia   . Allergy     seasonal  . Thyroid disease   . Overweight 02/26/2011  . Knee pain, right   . History of chicken pox   . SNORING 03/12/2010  . SLEEP APNEA, OBSTRUCTIVE 04/24/2010  . ALLERGIC RHINITIS 03/12/2010  . FATIGUE 03/12/2010  . Headache(784.0) 03/12/2010  . Neck pain, acute 03/12/2011  . Testosterone deficiency 10/19/2011  . Tendonitis 05/19/2012  . Sports physical 07/16/2012  . Preventative health care 11/04/2012    Past Surgical History  Procedure Laterality Date  . Bicep tendon in left arm  2000    Family History  Problem Relation Age of Onset  . Hyperlipidemia Mother   . Alcohol abuse Father     smoked  . Arthritis Father   . Lupus Paternal Grandmother   . Arthritis Maternal Grandmother   . Ulcers Maternal Grandmother   . Alzheimer's disease Maternal Grandfather     History   Social History  . Marital Status: Married    Spouse Name: N/A    Number of Children: 1  . Years of Education: N/A   Occupational History  . 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 [employer]    Social History Main  Topics  . Smoking status: Never Smoker   . Smokeless tobacco: Never Used  . Alcohol Use: Yes     Comment: occasional  . Drug Use: No  . Sexual Activity: Yes   Other Topics Concern  . Not on file   Social History Narrative   Grew up in [place]   Moved to [place] 1990    Current Outpatient Prescriptions on File Prior to Visit  Medication Sig Dispense Refill  . B-D 3CC LUER-LOK SYR 23GX1-1/2 23G X 1-1/2" 3 ML MISC USE EVERY 2 WEEKS WITH TESTOSTERONE INJECTION  25 each  1  . Calcium Citrate-Vitamin D 200-250 MG-UNIT TABS Take 1 capsule by mouth daily.  120 tablet  0  . fish oil-omega-3 fatty acids 1000 MG capsule Take 2 g by mouth daily.      Marland Kitchen. glucosamine-chondroitin 500-400 MG tablet Take 1 tablet by mouth daily.        Marland Kitchen. levothyroxine (SYNTHROID, LEVOTHROID) 50 MCG tablet TAKE 1 TABLET BY MOUTH EVERY DAY  90 tablet  1  . naproxen (NAPROSYN) 500 MG tablet Take 1 tablet (500 mg total) by mouth 2 (two) times daily with a meal. Prn for pain. Take w/ food  60 tablet  1  . Syringe, Disposable, (B-D SYRINGE LUER-LOK 3CC) 3 ML MISC Use with testosterone injection  every 2 weeks. 1 ml  25 each  2  . testosterone cypionate (DEPOTESTOTERONE CYPIONATE) 200 MG/ML injection Inject 1 mL (200 mg total) into the muscle every 14 (fourteen) days.  10 mL  0  . Wheat Dextrin (BENEFIBER) POWD 2 tsp po daily in liquids  730 g  0   No current facility-administered medications on file prior to visit.    No Known Allergies  Review of Systems  Review of Systems  Constitutional: Positive for malaise/fatigue. Negative for fever and chills.  HENT: Positive for ear pain and hearing loss. Negative for congestion and tinnitus.   Eyes: Negative for discharge.  Respiratory: Negative for shortness of breath.   Cardiovascular: Negative for chest pain, palpitations and leg swelling.  Gastrointestinal: Negative for nausea, abdominal pain and diarrhea.  Genitourinary: Negative for dysuria.  Musculoskeletal: Negative for  falls.  Skin: Negative for rash.  Neurological: Positive for headaches. Negative for loss of consciousness.  Endo/Heme/Allergies: Negative for polydipsia.  Psychiatric/Behavioral: Negative for depression and suicidal ideas. The patient is not nervous/anxious and does not have insomnia.     Objective  BP 122/78  Pulse 65  Temp(Src) 98.1 F (36.7 C) (Oral)  Ht 5' 8.5" (1.74 m)  Wt 238 lb 0.6 oz (107.974 kg)  BMI 35.66 kg/m2  SpO2 96%  Physical Exam  Physical Exam  Constitutional: He is oriented to person, place, and time and well-developed, well-nourished, and in no distress. No distress.  HENT:  Head: Normocephalic and atraumatic.  TMs dull and erythematous.  Eyes: Conjunctivae are normal.  Neck: Neck supple. No thyromegaly present.  Cardiovascular: Normal rate, regular rhythm and normal heart sounds.   No murmur heard. Pulmonary/Chest: Effort normal and breath sounds normal. No respiratory distress.  Abdominal: He exhibits no distension and no mass. There is no tenderness.  Genitourinary: Guaiac negative stool.  Musculoskeletal: He exhibits no edema.  Neurological: He is alert and oriented to person, place, and time.  Skin: Skin is warm.  Psychiatric: Memory, affect and judgment normal.    Lab Results  Component Value Date   TSH 1.805 12/29/2013   Lab Results  Component Value Date   WBC 8.3 12/29/2013   HGB 16.3 12/29/2013   HCT 46.5 12/29/2013   MCV 85.0 12/29/2013   PLT 254 12/29/2013   Lab Results  Component Value Date   CREATININE 0.84 12/29/2013   BUN 16 12/29/2013   NA 136 12/29/2013   K 4.1 12/29/2013   CL 102 12/29/2013   CO2 23 12/29/2013   Lab Results  Component Value Date   ALT 32 12/29/2013   AST 31 12/29/2013   ALKPHOS 27* 12/29/2013   BILITOT 0.8 12/29/2013   Lab Results  Component Value Date   CHOL 222* 12/29/2013   Lab Results  Component Value Date   HDL 37* 12/29/2013   Lab Results  Component Value Date   LDLCALC 157* 12/29/2013   Lab  Results  Component Value Date   TRIG 138 12/29/2013   Lab Results  Component Value Date   CHOLHDL 6.0 12/29/2013     Assessment & Plan  Otitis media Started on Amoxicillin and probiotics  ALLERGIC RHINITIS Will add Singulair to Zyrtec and nasal steroids.

## 2014-03-28 ENCOUNTER — Other Ambulatory Visit: Payer: Self-pay | Admitting: Family Medicine

## 2014-04-12 ENCOUNTER — Telehealth: Payer: Self-pay | Admitting: Family Medicine

## 2014-04-12 NOTE — Telephone Encounter (Signed)
Please order  Free testosterone and total testosterone for patient any time between now and visit

## 2014-04-12 NOTE — Telephone Encounter (Signed)
Just had test inj on 6/22, was told to blood work before next injection on 7/6. Please advise

## 2014-04-13 ENCOUNTER — Other Ambulatory Visit: Payer: Self-pay | Admitting: *Deleted

## 2014-04-13 DIAGNOSIS — E291 Testicular hypofunction: Secondary | ICD-10-CM

## 2014-04-13 NOTE — Telephone Encounter (Signed)
Labs ordered.

## 2014-04-17 ENCOUNTER — Encounter: Payer: Self-pay | Admitting: Family Medicine

## 2014-04-17 ENCOUNTER — Ambulatory Visit (INDEPENDENT_AMBULATORY_CARE_PROVIDER_SITE_OTHER): Payer: Managed Care, Other (non HMO) | Admitting: Family Medicine

## 2014-04-17 VITALS — BP 118/78 | HR 65 | Temp 97.7°F | Ht 68.5 in | Wt 234.1 lb

## 2014-04-17 DIAGNOSIS — E039 Hypothyroidism, unspecified: Secondary | ICD-10-CM

## 2014-04-17 DIAGNOSIS — R5381 Other malaise: Secondary | ICD-10-CM

## 2014-04-17 DIAGNOSIS — E291 Testicular hypofunction: Secondary | ICD-10-CM

## 2014-04-17 DIAGNOSIS — E663 Overweight: Secondary | ICD-10-CM

## 2014-04-17 DIAGNOSIS — R5383 Other fatigue: Secondary | ICD-10-CM

## 2014-04-17 DIAGNOSIS — R7989 Other specified abnormal findings of blood chemistry: Secondary | ICD-10-CM

## 2014-04-17 DIAGNOSIS — E785 Hyperlipidemia, unspecified: Secondary | ICD-10-CM

## 2014-04-17 DIAGNOSIS — E349 Endocrine disorder, unspecified: Secondary | ICD-10-CM

## 2014-04-17 LAB — CBC
HCT: 45 % (ref 39.0–52.0)
Hemoglobin: 15.5 g/dL (ref 13.0–17.0)
MCH: 29.6 pg (ref 26.0–34.0)
MCHC: 34.4 g/dL (ref 30.0–36.0)
MCV: 85.9 fL (ref 78.0–100.0)
PLATELETS: 250 10*3/uL (ref 150–400)
RBC: 5.24 MIL/uL (ref 4.22–5.81)
RDW: 13.6 % (ref 11.5–15.5)
WBC: 7.5 10*3/uL (ref 4.0–10.5)

## 2014-04-17 NOTE — Patient Instructions (Addendum)
Labs prior to visit add Testosterone   Hypothyroidism The thyroid is a large gland located in the lower front of your neck. The thyroid gland helps control metabolism. Metabolism is how your body handles food. It controls metabolism with the hormone thyroxine. When this gland is underactive (hypothyroid), it produces too little hormone.  CAUSES These include:   Absence or destruction of thyroid tissue.  Goiter due to iodine deficiency.  Goiter due to medications.  Congenital defects (since birth).  Problems with the pituitary. This causes a lack of TSH (thyroid stimulating hormone). This hormone tells the thyroid to turn out more hormone. SYMPTOMS  Lethargy (feeling as though you have no energy)  Cold intolerance  Weight gain (in spite of normal food intake)  Dry skin  Coarse hair  Menstrual irregularity (if severe, may lead to infertility)  Slowing of thought processes Cardiac problems are also caused by insufficient amounts of thyroid hormone. Hypothyroidism in the newborn is cretinism, and is an extreme form. It is important that this form be treated adequately and immediately or it will lead rapidly to retarded physical and mental development. DIAGNOSIS  To prove hypothyroidism, your caregiver may do blood tests and ultrasound tests. Sometimes the signs are hidden. It may be necessary for your caregiver to watch this illness with blood tests either before or after diagnosis and treatment. TREATMENT  Low levels of thyroid hormone are increased by using synthetic thyroid hormone. This is a safe, effective treatment. It usually takes about four weeks to gain the full effects of the medication. After you have the full effect of the medication, it will generally take another four weeks for problems to leave. Your caregiver may start you on low doses. If you have had heart problems the dose may be gradually increased. It is generally not an emergency to get rapidly to normal. HOME  CARE INSTRUCTIONS   Take your medications as your caregiver suggests. Let your caregiver know of any medications you are taking or start taking. Your caregiver will help you with dosage schedules.  As your condition improves, your dosage needs may increase. It will be necessary to have continuing blood tests as suggested by your caregiver.  Report all suspected medication side effects to your caregiver. SEEK MEDICAL CARE IF: Seek medical care if you develop:  Sweating.  Tremulousness (tremors).  Anxiety.  Rapid weight loss.  Heat intolerance.  Emotional swings.  Diarrhea.  Weakness. SEEK IMMEDIATE MEDICAL CARE IF:  You develop chest pain, an irregular heart beat (palpitations), or a rapid heart beat. MAKE SURE YOU:   Understand these instructions.  Will watch your condition.  Will get help right away if you are not doing well or get worse. Document Released: 10/06/2005 Document Revised: 12/29/2011 Document Reviewed: 05/26/2008 [facility] Patient Information 2015 [place]ExitCare, [place]LLC. This information is not intended to replace advice given to you by your health care provider. Make sure you discuss any questions you have with your health care provider.

## 2014-04-17 NOTE — Progress Notes (Signed)
Pre visit review using our clinic review tool, if applicable. No additional management support is needed unless otherwise documented below in the visit note. 

## 2014-04-17 NOTE — Assessment & Plan Note (Addendum)
Testosterone level is wnl limits today would like to switch to Axiron topical treatments.

## 2014-04-18 ENCOUNTER — Other Ambulatory Visit: Payer: Self-pay | Admitting: Family Medicine

## 2014-04-18 DIAGNOSIS — Z8639 Personal history of other endocrine, nutritional and metabolic disease: Secondary | ICD-10-CM

## 2014-04-18 LAB — RENAL FUNCTION PANEL
ALBUMIN: 4.2 g/dL (ref 3.5–5.2)
BUN: 14 mg/dL (ref 6–23)
CO2: 21 mEq/L (ref 19–32)
CREATININE: 0.85 mg/dL (ref 0.50–1.35)
Calcium: 9 mg/dL (ref 8.4–10.5)
Chloride: 105 mEq/L (ref 96–112)
Glucose, Bld: 93 mg/dL (ref 70–99)
PHOSPHORUS: 2.9 mg/dL (ref 2.3–4.6)
Potassium: 4 mEq/L (ref 3.5–5.3)
Sodium: 137 mEq/L (ref 135–145)

## 2014-04-18 LAB — HEPATIC FUNCTION PANEL
ALBUMIN: 4.2 g/dL (ref 3.5–5.2)
ALT: 22 U/L (ref 0–53)
AST: 24 U/L (ref 0–37)
Alkaline Phosphatase: 30 U/L — ABNORMAL LOW (ref 39–117)
BILIRUBIN DIRECT: 0.1 mg/dL (ref 0.0–0.3)
Indirect Bilirubin: 0.2 mg/dL (ref 0.2–1.2)
Total Bilirubin: 0.3 mg/dL (ref 0.2–1.2)
Total Protein: 6.6 g/dL (ref 6.0–8.3)

## 2014-04-18 LAB — LIPID PANEL
CHOL/HDL RATIO: 6.1 ratio
CHOLESTEROL: 195 mg/dL (ref 0–200)
HDL: 32 mg/dL — AB (ref >39)
LDL CALC: 98 mg/dL (ref 0–99)
Triglycerides: 324 mg/dL — ABNORMAL HIGH (ref <150)
VLDL: 65 mg/dL — AB (ref 0–40)

## 2014-04-18 LAB — TESTOSTERONE: Testosterone: 844 ng/dL (ref 300–890)

## 2014-04-18 LAB — TSH: TSH: 3.182 u[IU]/mL (ref 0.350–4.500)

## 2014-04-18 MED ORDER — TESTOSTERONE 30 MG/ACT TD SOLN: 90.0000 mg | Freq: Every day | TRANSDERMAL | Status: DC

## 2014-04-23 ENCOUNTER — Encounter: Payer: Self-pay | Admitting: Family Medicine

## 2014-04-23 NOTE — Assessment & Plan Note (Signed)
Encouraged DASH diet, decrease po intake and increase exercise as tolerated. Needs 7-8 hours of sleep nightly. Avoid trans fats, eat small, frequent meals every 4-5 hours with lean proteins, complex carbs and healthy fats. Minimize simple carbs, GMO foods. 

## 2014-04-23 NOTE — Progress Notes (Signed)
Patient ID: Dustin PattenBrian Spears, male   DOB: 1965-12-12, 48 y.o.   MRN: [id number] Dustin PattenBrian Spears [id number] 1965-12-12 04/23/2014      Progress Note-Follow Up  Subjective  Chief Complaint  Chief Complaint  Patient presents with  . Follow-up    3 month    HPI  Patient is a 48 year old male in today for routine medical care. Patient is in today for routine followup. He denies any recent illness. He acknowledges persistent fatigue. Has intermittent congestion. Allergies no recent fevers or concerns otherwise. Denies CP/palp/SOB/HA/congestion/fevers/GI or GU c/o. Taking meds as prescribed  Past Medical History  Diagnosis Date  . Hyperlipidemia   . Allergy     seasonal  . Thyroid disease   . Overweight(278.02) 02/26/2011  . Knee pain, right   . History of chicken pox   . SNORING 03/12/2010  . SLEEP APNEA, OBSTRUCTIVE 04/24/2010  . ALLERGIC RHINITIS 03/12/2010  . FATIGUE 03/12/2010  . Headache(784.0) 03/12/2010  . Neck pain, acute 03/12/2011  . Testosterone deficiency 10/19/2011  . Tendonitis 05/19/2012  . Sports physical 07/16/2012  . Preventative health care 11/04/2012    Past Surgical History  Procedure Laterality Date  . Bicep tendon in left arm  2000    Family History  Problem Relation Age of Onset  . Hyperlipidemia Mother   . Alcohol abuse Father     smoked  . Arthritis Father   . Lupus Paternal Grandmother   . Arthritis Maternal Grandmother   . Ulcers Maternal Grandmother   . Alzheimer's disease Maternal Grandfather     History   Social History  . Marital Status: Married    Spouse Name: N/A    Number of Children: 1  . Years of Education: N/A   Occupational History  . 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 [employer]    Social History Main Topics  . Smoking status: Never Smoker   . Smokeless tobacco: Never Used  . Alcohol Use: Yes     Comment: occasional  . Drug Use: No  . Sexual Activity: Yes   Other Topics Concern  . Not on file   Social History Narrative   Grew up in [place]    Moved to [place] 1990    Current Outpatient Prescriptions on File Prior to Visit  Medication Sig Dispense Refill  . B-D 3CC LUER-LOK SYR 23GX1-1/2 23G X 1-1/2" 3 ML MISC USE EVERY 2 WEEKS WITH TESTOSTERONE INJECTION  25 each  1  . Calcium Citrate-Vitamin D 200-250 MG-UNIT TABS Take 1 capsule by mouth daily.  120 tablet  0  . cetirizine (ZYRTEC) 10 MG tablet Take 10 mg by mouth daily.      . fish oil-omega-3 fatty acids 1000 MG capsule Take 2 g by mouth daily.      Marland Kitchen. glucosamine-chondroitin 500-400 MG tablet Take 1 tablet by mouth daily.        Marland Kitchen. levothyroxine (SYNTHROID, LEVOTHROID) 50 MCG tablet TAKE 1 TABLET BY MOUTH EVERY DAY  90 tablet  1  . Misc. Devices (NASADOCK) MISC by Does not apply route.      . naproxen (NAPROSYN) 500 MG tablet Take 1 tablet (500 mg total) by mouth 2 (two) times daily with a meal. Prn for pain. Take w/ food  60 tablet  1  . Syringe, Disposable, (B-D SYRINGE LUER-LOK 3CC) 3 ML MISC Use with testosterone injection every 2 weeks. 1 ml  25 each  2  . testosterone cypionate (DEPOTESTOTERONE CYPIONATE) 200 MG/ML injection Inject  1 mL (200 mg total) into the muscle every 14 (fourteen) days.  10 mL  0  . Wheat Dextrin (BENEFIBER) POWD 2 tsp po daily in liquids  730 g  0   No current facility-administered medications on file prior to visit.    No Known Allergies  Review of Systems  Review of Systems  Constitutional: Positive for malaise/fatigue. Negative for fever.  HENT: Negative for congestion.   Eyes: Negative for discharge.  Respiratory: Negative for shortness of breath.   Cardiovascular: Negative for chest pain, palpitations and leg swelling.  Gastrointestinal: Negative for nausea, abdominal pain and diarrhea.  Genitourinary: Negative for dysuria.  Musculoskeletal: Negative for falls.  Skin: Negative for rash.  Neurological: Negative for loss of consciousness and headaches.  Endo/Heme/Allergies: Negative for polydipsia.  Psychiatric/Behavioral: Negative  for depression and suicidal ideas. The patient is not nervous/anxious and does not have insomnia.     Objective  BP 118/78  Pulse 65  Temp(Src) 97.7 F (36.5 C) (Oral)  Ht 5' 8.5" (1.74 m)  Wt 234 lb 1.9 oz (106.196 kg)  BMI 35.08 kg/m2  SpO2 97%  Physical Exam  Physical Exam  Constitutional: He is oriented to person, place, and time and well-developed, well-nourished, and in no distress. No distress.  HENT:  Head: Normocephalic and atraumatic.  Eyes: Conjunctivae are normal.  Neck: Neck supple. No thyromegaly present.  Cardiovascular: Normal rate, regular rhythm and normal heart sounds.   No murmur heard. Pulmonary/Chest: Effort normal and breath sounds normal. No respiratory distress.  Abdominal: He exhibits no distension and no mass. There is no tenderness.  Musculoskeletal: He exhibits no edema.  Neurological: He is alert and oriented to person, place, and time.  Skin: Skin is warm.  Psychiatric: Memory, affect and judgment normal.    Lab Results  Component Value Date   TSH 3.182 04/17/2014   Lab Results  Component Value Date   WBC 7.5 04/17/2014   HGB 15.5 04/17/2014   HCT 45.0 04/17/2014   MCV 85.9 04/17/2014   PLT 250 04/17/2014   Lab Results  Component Value Date   CREATININE 0.85 04/17/2014   BUN 14 04/17/2014   NA 137 04/17/2014   K 4.0 04/17/2014   CL 105 04/17/2014   CO2 21 04/17/2014   Lab Results  Component Value Date   ALT 22 04/17/2014   AST 24 04/17/2014   ALKPHOS 30* 04/17/2014   BILITOT 0.3 04/17/2014   Lab Results  Component Value Date   CHOL 195 04/17/2014   Lab Results  Component Value Date   HDL 32* 04/17/2014   Lab Results  Component Value Date   LDLCALC 98 04/17/2014   Lab Results  Component Value Date   TRIG 324* 04/17/2014   Lab Results  Component Value Date   CHOLHDL 6.1 04/17/2014     Assessment & Plan  Testosterone deficiency Testosterone level is wnl limits today would like to switch to Axiron topical  treatments.  HYPOTHYROIDISM On Levothyroxine, continue to monitor  Overweight Encouraged DASH diet, decrease po intake and increase exercise as tolerated. Needs 7-8 hours of sleep nightly. Avoid trans fats, eat small, frequent meals every 4-5 hours with lean proteins, complex carbs and healthy fats. Minimize simple carbs, GMO foods.  Hyperlipidemia Encouraged heart healthy diet, increase exercise, avoid trans fats, consider a krill oil cap daily

## 2014-04-23 NOTE — Assessment & Plan Note (Signed)
On Levothyroxine, continue to monitor 

## 2014-04-23 NOTE — Assessment & Plan Note (Signed)
Encouraged heart healthy diet, increase exercise, avoid trans fats, consider a krill oil cap daily 

## 2014-05-10 ENCOUNTER — Other Ambulatory Visit: Payer: Self-pay | Admitting: Family Medicine

## 2014-05-11 NOTE — Telephone Encounter (Signed)
OK to switch back to injections at last known dose

## 2014-05-11 NOTE — Telephone Encounter (Signed)
Pt's wife  Contacted office today stating Husband does not like the Testosterone that applies under arms. Would like to go back to Inj med instead.Wife has placed a refill in for this one already.Plz Advise.

## 2014-05-12 NOTE — Telephone Encounter (Signed)
Opened in error. This was done yesterday.

## 2014-05-16 ENCOUNTER — Ambulatory Visit (INDEPENDENT_AMBULATORY_CARE_PROVIDER_SITE_OTHER): Payer: Managed Care, Other (non HMO) | Admitting: Family Medicine

## 2014-05-16 ENCOUNTER — Encounter: Payer: Self-pay | Admitting: Family Medicine

## 2014-05-16 ENCOUNTER — Ambulatory Visit (HOSPITAL_BASED_OUTPATIENT_CLINIC_OR_DEPARTMENT_OTHER)
Admission: RE | Admit: 2014-05-16 | Discharge: 2014-05-16 | Disposition: A | Discharge status: Home / Self Care | Payer: Managed Care, Other (non HMO) | Source: Ambulatory Visit | Attending: Family Medicine | Admitting: Family Medicine

## 2014-05-16 VITALS — BP 130/90 | HR 62 | Temp 98.5°F | Ht 68.5 in | Wt 234.0 lb

## 2014-05-16 DIAGNOSIS — M25511 Pain in right shoulder: Secondary | ICD-10-CM

## 2014-05-16 DIAGNOSIS — S46011A Strain of muscle(s) and tendon(s) of the rotator cuff of right shoulder, initial encounter: Secondary | ICD-10-CM

## 2014-05-16 DIAGNOSIS — M25519 Pain in unspecified shoulder: Secondary | ICD-10-CM | POA: Insufficient documentation

## 2014-05-16 DIAGNOSIS — S43429A Sprain of unspecified rotator cuff capsule, initial encounter: Secondary | ICD-10-CM

## 2014-05-16 NOTE — Patient Instructions (Signed)
Rotator Cuff Injury Rotator cuff injury is any type of injury to the set of muscles and tendons that make up the stabilizing unit of your shoulder. This unit holds the ball of your upper arm bone (humerus) in the socket of your shoulder blade (scapula).  CAUSES Injuries to your rotator cuff most commonly come from sports or activities that cause your arm to be moved repeatedly over your head. Examples of this include throwing, weight lifting, swimming, or racquet sports. Long lasting (chronic) irritation of your rotator cuff can cause soreness and swelling (inflammation), bursitis, and eventual damage to your tendons, such as a tear (rupture). SIGNS AND SYMPTOMS Acute rotator cuff tear:  Sudden tearing sensation followed by severe pain shooting from your upper shoulder down your arm toward your elbow.  Decreased range of motion of your shoulder because of pain and muscle spasm.  Severe pain.  Inability to raise your arm out to the side because of pain and loss of muscle power (large tears). Chronic rotator cuff tear:  Pain that usually is worse at night and may interfere with sleep.  Gradual weakness and decreased shoulder motion as the pain worsens.  Decreased range of motion. Rotator cuff tendinitis:  Deep ache in your shoulder and the outside upper arm over your shoulder.  Pain that comes on gradually and becomes worse when lifting your arm to the side or turning it inward. DIAGNOSIS Rotator cuff injury is diagnosed through a medical history, physical exam, and imaging exam. The medical history helps determine the type of rotator cuff injury. Your health care provider will look at your injured shoulder, feel the injured area, and ask you to move your shoulder in different positions. X-ray exams typically are done to rule out other causes of shoulder pain, such as fractures. MRI is the exam of choice for the most severe shoulder injuries because the images show muscles and tendons.    TREATMENT  Chronic tear:  Medicine for pain, such as acetaminophen or ibuprofen.  Physical therapy and range-of-motion exercises may be helpful in maintaining shoulder function and strength.  Steroid injections into your shoulder joint.  Surgical repair of the rotator cuff if the injury does not heal with noninvasive treatment. Acute tear:  Anti-inflammatory medicines such as ibuprofen and naproxen to help reduce pain and swelling.  A sling to help support your arm and rest your rotator cuff muscles. Long-term use of a sling is not advised. It may cause significant stiffening of the shoulder joint.  Surgery may be considered within a few weeks, especially in younger, active people, to return the shoulder to full function.  Indications for surgical treatment include the following:  Age younger than 60 years.  Rotator cuff tears that are complete.  Physical therapy, rest, and anti-inflammatory medicines have been used for 6-8 weeks, with no improvement.  Employment or sporting activity that requires constant shoulder use. Tendinitis:  Anti-inflammatory medicines such as ibuprofen and naproxen to help reduce pain and swelling.  A sling to help support your arm and rest your rotator cuff muscles. Long-term use of a sling is not advised. It may cause significant stiffening of the shoulder joint.  Severe tendinitis may require:  Steroid injections into your shoulder joint.  Physical therapy.  Surgery. HOME CARE INSTRUCTIONS   Apply ice to your injury:  Put ice in a plastic bag.  Place a towel between your skin and the bag.  Leave the ice on for 20 minutes, 2-3 times a day.  If you   have a shoulder immobilizer (sling and straps), wear it until told otherwise by your health care provider.  You may want to sleep on several pillows or in a recliner at night to lessen swelling and pain.  Only take over-the-counter or prescription medicines for pain, discomfort, or fever as  directed by your health care provider.  Do simple hand squeezing exercises with a soft rubber ball to decrease hand swelling. SEEK MEDICAL CARE IF:   Your shoulder pain increases, or new pain or numbness develops in your arm, hand, or fingers.  Your hand or fingers are colder than your other hand. SEEK IMMEDIATE MEDICAL CARE IF:   Your arm, hand, or fingers are numb or tingling.  Your arm, hand, or fingers are increasingly swollen and painful, or they turn white or blue. MAKE SURE YOU:  Understand these instructions.  Will watch your condition.  Will get help right away if you are not doing well or get worse. Document Released: 10/03/2000 Document Revised: 10/11/2013 Document Reviewed: 05/18/2013 ExitCare Patient Information 2015 [employer]. This information is not intended to replace advice given to you by your health care provider. Make sure you discuss any questions you have with your health care provider.  

## 2014-05-16 NOTE — Progress Notes (Signed)
Pre visit review using our clinic review tool, if applicable. No additional management support is needed unless otherwise documented below in the visit note. 

## 2014-05-17 ENCOUNTER — Ambulatory Visit (INDEPENDENT_AMBULATORY_CARE_PROVIDER_SITE_OTHER): Payer: Managed Care, Other (non HMO) | Admitting: Family Medicine

## 2014-05-17 ENCOUNTER — Encounter: Payer: Self-pay | Admitting: Family Medicine

## 2014-05-17 VITALS — BP 130/84 | HR 64 | Ht 69.0 in | Wt 235.0 lb

## 2014-05-17 DIAGNOSIS — S43429A Sprain of unspecified rotator cuff capsule, initial encounter: Secondary | ICD-10-CM

## 2014-05-17 DIAGNOSIS — S46011A Strain of muscle(s) and tendon(s) of the rotator cuff of right shoulder, initial encounter: Secondary | ICD-10-CM

## 2014-05-17 NOTE — Patient Instructions (Signed)
You have a rotator cuff strain Try to avoid painful activities (overhead activities, lifting with extended arm) as much as possible. Aleve 2 tabs twice a day with food OR ibuprofen 3 tabs three times a day with food for pain and inflammation as needed. Can take tylenol in addition to this. Subacromial injection may be beneficial to help with pain and to decrease inflammation. Start physical therapy with transition to home exercise program. Do home exercise program with theraband and scapular stabilization exercises daily - these are very important for long term relief even if an injection was given. If not improving at follow-up we will consider further imaging, injection and/or nitro patches. Follow up with me in 5-6 weeks.

## 2014-05-18 ENCOUNTER — Encounter: Payer: Self-pay | Admitting: Family Medicine

## 2014-05-18 DIAGNOSIS — S46011A Strain of muscle(s) and tendon(s) of the rotator cuff of right shoulder, initial encounter: Secondary | ICD-10-CM | POA: Insufficient documentation

## 2014-05-18 NOTE — Assessment & Plan Note (Signed)
concern about partial tear or strain specifically of infraspinatus given his exam, ultrasound.  Should do well with conservative treatment.  Icing, nsaids.  Start physical therapy and home exercise program.  If not improving at follow-up we will consider further imaging, injection and/or nitro patches.  Follow up with me in 5-6 weeks.

## 2014-05-18 NOTE — Progress Notes (Signed)
Patient ID: Dustin Spears, male   DOB: [birth year], 48 y.o.   MRN: [id number]  Referred by and PCP: Danise EdgeBLYTH, STACEY, MD  Subjective:   HPI: Patient is a 48 y.o. male here for right shoulder injury.  Patient reports on 7/24 he was moving items in a storage building. He went to grab a racing motor that was falling away from him. This jerked shoulder forward. Did not notice any problems until 30 minutes later. Had full motion. Now difficult to do overhead activities for a long time. Pain isn't that much of an issue. Took naproxen initially but none now. No prior issues with right shoulder. No night pain.  Past Medical History  Diagnosis Date  . Hyperlipidemia   . Allergy     seasonal  . Thyroid disease   . Overweight(278.02) 02/26/2011  . Knee pain, right   . History of chicken pox   . SNORING 03/12/2010  . SLEEP APNEA, OBSTRUCTIVE 04/24/2010  . ALLERGIC RHINITIS 03/12/2010  . FATIGUE 03/12/2010  . Headache(784.0) 03/12/2010  . Neck pain, acute 03/12/2011  . Testosterone deficiency 10/19/2011  . Tendonitis 05/19/2012  . Sports physical 07/16/2012  . Preventative health care 11/04/2012    Current Outpatient Prescriptions on File Prior to Visit  Medication Sig Dispense Refill  . B-D 3CC LUER-LOK SYR 23GX1-1/2 23G X 1-1/2" 3 ML MISC USE EVERY 2 WEEKS WITH TESTOSTERONE INJECTION  25 each  1  . Calcium Citrate-Vitamin D 200-250 MG-UNIT TABS Take 1 capsule by mouth daily.  120 tablet  0  . cetirizine (ZYRTEC) 10 MG tablet Take 10 mg by mouth daily.      . fish oil-omega-3 fatty acids 1000 MG capsule Take 2 g by mouth daily.      Marland Kitchen. glucosamine-chondroitin 500-400 MG tablet Take 1 tablet by mouth daily.        Marland Kitchen. levothyroxine (SYNTHROID, LEVOTHROID) 50 MCG tablet TAKE 1 TABLET BY MOUTH EVERY DAY  90 tablet  1  . Misc. Devices (NASADOCK) MISC by Does not apply route.      . naproxen (NAPROSYN) 500 MG tablet Take 1 tablet (500 mg total) by mouth 2 (two) times daily with a meal. Prn for pain. Take  w/ food  60 tablet  1  . Syringe, Disposable, (B-D SYRINGE LUER-LOK 3CC) 3 ML MISC Use with testosterone injection every 2 weeks. 1 ml  25 each  2  . Testosterone (AXIRON) 30 MG/ACT SOLN Place 90 mg onto the skin daily.  90 mL  3  . testosterone cypionate (DEPOTESTOTERONE CYPIONATE) 200 MG/ML injection INJECT 1 ML INTO THE MUSCLE EVERY 14 DAYS  10 mL  0  . Wheat Dextrin (BENEFIBER) POWD 2 tsp po daily in liquids  730 g  0   No current facility-administered medications on file prior to visit.    Past Surgical History  Procedure Laterality Date  . Bicep tendon in left arm  2000    No Known Allergies  History   Social History  . Marital Status: Married    Spouse Name: N/A    Number of Children: 1  . Years of Education: N/A   Occupational History  . 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 [employer]    Social History Main Topics  . Smoking status: Never Smoker   . Smokeless tobacco: Never Used  . Alcohol Use: Yes     Comment: occasional  . Drug Use: No  . Sexual Activity: Yes   Other Topics Concern  . Not  on file   Social History Narrative   Grew up in [place] to [employer] [place]    Family History  Problem Relation Age of Onset  . Hyperlipidemia Mother   . Alcohol abuse Father     smoked  . Arthritis Father   . Lupus Paternal Grandmother   . Arthritis Maternal Grandmother   . Ulcers Maternal Grandmother   . Alzheimer's disease Maternal Grandfather     BP 130/84  Pulse 64  Ht 5\' 9"  (1.753 m)  Wt 235 lb (106.595 kg)  BMI 34.69 kg/m2  Review of Systems: See HPI above.    Objective:  Physical Exam:  Gen: NAD  Right shoulder: No swelling, ecchymoses.  No gross deformity. No TTP. FROM with pain on external rotation, 4/5 strength. Negative Hawkins, Neers. Negative Speeds, Yergasons. Strength 5/5 with empty can and resisted internal rotation.  Pain external rotation. Negative apprehension. Negative O'briens. NV intact distally.  MSK u/s of right infraspinatus shows  muscle inserting on humerus - no retraction or obvious full thickness tear    Assessment & Plan:  1. Right rotator cuff strain - concern about partial tear or strain specifically of infraspinatus given his exam, ultrasound.  Should do well with conservative treatment.  Icing, nsaids.  Start physical therapy and home exercise program.  If not improving at follow-up we will consider further imaging, injection and/or nitro patches.  Follow up with me in 5-6 weeks.

## 2014-05-21 ENCOUNTER — Encounter: Payer: Self-pay | Admitting: Family Medicine

## 2014-05-21 NOTE — Progress Notes (Signed)
Patient ID: Dustin Spears, male   DOB: [birth year], 48 y.o.   MRN: [id number] Dustin Spears [id number] Dustin Spears 05/21/2014      Progress Note-Follow Up  Subjective  Chief Complaint  Chief Complaint  Patient presents with  . Shoulder Pain    right side X 5 days    HPI  Patient is a 48 year old male in today for routine medical care. 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 He has had pain in the right shoulder since then. It improved initially with Naproxen but then worsened again when he went mountain biking. No radicular pain. No weakness. No recent illness, cp, palp, sob  Past Medical History  Diagnosis Date  . Hyperlipidemia   . Allergy     seasonal  . Thyroid disease   . Overweight(278.02) 02/26/2011  . Knee pain, right   . History of chicken pox   . SNORING 03/12/2010  . SLEEP APNEA, OBSTRUCTIVE 04/24/2010  . ALLERGIC RHINITIS 03/12/2010  . FATIGUE 03/12/2010  . Headache(784.0) 03/12/2010  . Neck pain, acute 03/12/2011  . Testosterone deficiency 10/19/2011  . Tendonitis 05/19/2012  . Sports physical 07/16/2012  . Preventative health care 11/04/2012    Past Surgical History  Procedure Laterality Date  . Bicep tendon in left arm  2000    Family History  Problem Relation Age of Onset  . Hyperlipidemia Mother   . Alcohol abuse Father     smoked  . Arthritis Father   . Lupus Paternal Grandmother   . Arthritis Maternal Grandmother   . Ulcers Maternal Grandmother   . Alzheimer's disease Maternal Grandfather     History   Social History  . Marital Status: Married    Spouse Name: N/A    Number of Children: 1  . Years of Education: N/A   Occupational History  . 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 [employer]    Social History Main Topics  . Smoking status: Never Smoker   . Smokeless tobacco: Never Used  . Alcohol Use: Yes     Comment: occasional  . Drug Use: No  . Sexual Activity: Yes   Other Topics Concern  . Not on file   Social History  Narrative   Grew up in [place] to [employer] [place]    Current Outpatient Prescriptions on File Prior to Visit  Medication Sig Dispense Refill  . B-D 3CC LUER-LOK SYR 23GX1-1/2 23G X 1-1/2" 3 ML MISC USE EVERY 2 WEEKS WITH TESTOSTERONE INJECTION  25 each  1  . Calcium Citrate-Vitamin D 200-250 MG-UNIT TABS Take 1 capsule by mouth daily.  120 tablet  0  . cetirizine (ZYRTEC) 10 MG tablet Take 10 mg by mouth daily.      . fish oil-omega-3 fatty acids 1000 MG capsule Take 2 g by mouth daily.      Marland Kitchen glucosamine-chondroitin 500-400 MG tablet Take 1 tablet by mouth daily.        Marland Kitchen levothyroxine (SYNTHROID, LEVOTHROID) 50 MCG tablet TAKE 1 TABLET BY MOUTH EVERY DAY  90 tablet  1  . Misc. Devices (NASADOCK) MISC by Does not apply route.      . naproxen (NAPROSYN) 500 MG tablet Take 1 tablet (500 mg total) by mouth 2 (two) times daily with a meal. Prn for pain. Take w/ food  60 tablet  1  . Syringe, Disposable, (B-D SYRINGE LUER-LOK 3CC) 3 ML MISC Use with testosterone injection every  2 weeks. 1 ml  25 each  2  . Testosterone (AXIRON) 30 MG/ACT SOLN Place 90 mg onto the skin daily.  90 mL  3  . testosterone cypionate (DEPOTESTOTERONE CYPIONATE) 200 MG/ML injection INJECT 1 ML INTO THE MUSCLE EVERY 14 DAYS  10 mL  0  . Wheat Dextrin (BENEFIBER) POWD 2 tsp po daily in liquids  730 g  0   No current facility-administered medications on file prior to visit.    No Known Allergies  Review of Systems  Review of Systems  Constitutional: Negative for fever and malaise/fatigue.  HENT: Negative for congestion.   Eyes: Negative for discharge.  Respiratory: Negative for shortness of breath.   Cardiovascular: Negative for chest pain, palpitations and leg swelling.  Gastrointestinal: Negative for nausea, abdominal pain and diarrhea.  Genitourinary: Negative for dysuria.  Musculoskeletal: Positive for joint pain. Negative for falls.       Right shoulder pain  Skin: Negative for rash.  Neurological:  Negative for loss of consciousness and headaches.  Endo/Heme/Allergies: Negative for polydipsia.  Psychiatric/Behavioral: Negative for depression and suicidal ideas. The patient is not nervous/anxious and does not have insomnia.     Objective  BP 130/90  Pulse 62  Temp(Src) 98.5 F (36.9 C) (Oral)  Ht 5' 8.5" (1.74 m)  Wt 234 lb 0.6 oz (106.16 kg)  BMI 35.06 kg/m2  SpO2 97%  Physical Exam  Physical Exam  Constitutional: He is oriented to person, place, and time and well-developed, well-nourished, and in no distress. No distress.  HENT:  Head: Normocephalic and atraumatic.  Eyes: Conjunctivae are normal.  Neck: Neck supple. No thyromegaly present.  Cardiovascular: Normal rate, regular rhythm and normal heart sounds.   No murmur heard. Pulmonary/Chest: Effort normal and breath sounds normal. No respiratory distress.  Abdominal: He exhibits no distension and no mass. There is no tenderness.  Musculoskeletal: Normal range of motion. He exhibits no edema and no tenderness.  Increased pain with external rotation  Neurological: He is alert and oriented to person, place, and time.  Skin: Skin is warm.  Psychiatric: Memory, affect and judgment normal.    Lab Results  Component Value Date   TSH 3.182 04/17/2014   Lab Results  Component Value Date   WBC 7.5 04/17/2014   HGB 15.5 04/17/2014   HCT 45.0 04/17/2014   MCV 85.9 04/17/2014   PLT 250 04/17/2014   Lab Results  Component Value Date   CREATININE 0.85 04/17/2014   BUN 14 04/17/2014   NA 137 04/17/2014   K 4.0 04/17/2014   CL 105 04/17/2014   CO2 21 04/17/2014   Lab Results  Component Value Date   ALT 22 04/17/2014   AST 24 04/17/2014   ALKPHOS 30* 04/17/2014   BILITOT 0.3 04/17/2014   Lab Results  Component Value Date   CHOL 195 04/17/2014   Lab Results  Component Value Date   HDL 32* 04/17/2014   Lab Results  Component Value Date   LDLCALC 98 04/17/2014   Lab Results  Component Value Date   TRIG 324* 04/17/2014    Lab Results  Component Value Date   CHOLHDL 6.1 04/17/2014     Assessment & Plan  Strain of tendon of right rotator cuff Injured his shoulder 5 days prior to his visit while trying to steady a large engine at work, he had pain immediately but it was not severe. Unfortunately it has persisted. It improved slightly initially then worsened once a gain. No warmth, redness  or deformity, Dustin continue to use topical treatments and Naproxen and is referred to sports medicine for further consideration. Xray shows no acute abnormality

## 2014-05-21 NOTE — Assessment & Plan Note (Addendum)
Injured his shoulder 5 days prior to his visit while trying to steady a large engine at work, he had pain immediately but it was not severe. Unfortunately it has persisted. It improved slightly initially then worsened once a gain. No warmth, redness or deformity, may continue to use topical treatments and Naproxen and is referred to sports medicine for further consideration. Xray shows no acute abnormality

## 2014-05-30 ENCOUNTER — Telehealth: Payer: Self-pay | Admitting: Family Medicine

## 2014-05-30 NOTE — Telephone Encounter (Signed)
Ok for him to come in for an injection.  Thanks!

## 2014-05-31 ENCOUNTER — Ambulatory Visit (INDEPENDENT_AMBULATORY_CARE_PROVIDER_SITE_OTHER): Payer: Managed Care, Other (non HMO) | Admitting: Family Medicine

## 2014-05-31 ENCOUNTER — Encounter: Payer: Self-pay | Admitting: Family Medicine

## 2014-05-31 VITALS — BP 121/75 | HR 61 | Ht 69.0 in | Wt 235.0 lb

## 2014-05-31 DIAGNOSIS — IMO0002 Reserved for concepts with insufficient information to code with codable children: Secondary | ICD-10-CM

## 2014-05-31 DIAGNOSIS — M25519 Pain in unspecified shoulder: Secondary | ICD-10-CM

## 2014-05-31 DIAGNOSIS — S46011D Strain of muscle(s) and tendon(s) of the rotator cuff of right shoulder, subsequent encounter: Secondary | ICD-10-CM

## 2014-05-31 DIAGNOSIS — M25511 Pain in right shoulder: Secondary | ICD-10-CM

## 2014-05-31 DIAGNOSIS — Z5189 Encounter for other specified aftercare: Secondary | ICD-10-CM

## 2014-05-31 MED ORDER — METHYLPREDNISOLONE ACETATE 40 MG/ML IJ SUSP
40.0000 mg | Freq: Once | INTRAMUSCULAR | Status: AC
  Administered 2014-05-31: 40 mg via INTRA_ARTICULAR

## 2014-06-01 ENCOUNTER — Encounter: Payer: Self-pay | Admitting: Family Medicine

## 2014-06-01 NOTE — Progress Notes (Signed)
Patient ID: Dustin Spears, male   DOB: 03/05/1966, 48 y.o.   MRN: [id number]  Referred by and PCP: [name], MD  Subjective:   HPI: Patient is a 48 y.o. male here for right shoulder injury.  7/29: Patient reports on 7/24 he was moving items in a storage building. He went to grab a racing motor that was falling away from him. This jerked shoulder forward. Did not notice any problems until 30 minutes later. Had full motion. Now difficult to do overhead activities for a long time. Pain isn't that much of an issue. Took naproxen initially but none now. No prior issues with right shoulder. No night pain.  8/12: Patient returns today for injection.  Past Medical History  Diagnosis Date  . Hyperlipidemia   . Allergy     seasonal  . Thyroid disease   . Overweight(278.02) 02/26/2011  . Knee pain, right   . History of chicken pox   . SNORING 03/12/2010  . SLEEP APNEA, OBSTRUCTIVE 04/24/2010  . ALLERGIC RHINITIS 03/12/2010  . FATIGUE 03/12/2010  . Headache(784.0) 03/12/2010  . Neck pain, acute 03/12/2011  . Testosterone deficiency 10/19/2011  . Tendonitis 05/19/2012  . Sports physical 07/16/2012  . Preventative health care 11/04/2012    Current Outpatient Prescriptions on File Prior to Visit  Medication Sig Dispense Refill  . B-D 3CC LUER-LOK SYR 23GX1-1/2 23G X 1-1/2" 3 ML MISC USE EVERY 2 WEEKS WITH TESTOSTERONE INJECTION  25 each  1  . Calcium Citrate-Vitamin D 200-250 MG-UNIT TABS Take 1 capsule by mouth daily.  120 tablet  0  . cetirizine (ZYRTEC) 10 MG tablet Take 10 mg by mouth daily.      . fish oil-omega-3 fatty acids 1000 MG capsule Take 2 g by mouth daily.      Marland Kitchen glucosamine-chondroitin 500-400 MG tablet Take 1 tablet by mouth daily.        Marland Kitchen levothyroxine (SYNTHROID, LEVOTHROID) 50 MCG tablet TAKE 1 TABLET BY MOUTH EVERY DAY  90 tablet  1  . Misc. Devices (NASADOCK) MISC by Does not apply route.      . naproxen (NAPROSYN) 500 MG tablet Take 1 tablet (500 mg total) by  mouth 2 (two) times daily with a meal. Prn for pain. Take w/ food  60 tablet  1  . Syringe, Disposable, (B-D SYRINGE LUER-LOK 3CC) 3 ML MISC Use with testosterone injection every 2 weeks. 1 ml  25 each  2  . Testosterone (AXIRON) 30 MG/ACT SOLN Place 90 mg onto the skin daily.  90 mL  3  . testosterone cypionate (DEPOTESTOTERONE CYPIONATE) 200 MG/ML injection INJECT 1 ML INTO THE MUSCLE EVERY 14 DAYS  10 mL  0  . Wheat Dextrin (BENEFIBER) POWD 2 tsp po daily in liquids  730 g  0   No current facility-administered medications on file prior to visit.    Past Surgical History  Procedure Laterality Date  . Bicep tendon in left arm  2000    No Known Allergies  History   Social History  . Marital Status: Married    Spouse Name: N/A    Number of Children: 1  . Years of Education: N/A   Occupational History  . 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 [employer]    Social History Main Topics  . Smoking status: Never Smoker   . Smokeless tobacco: Never Used  . Alcohol Use: Yes     Comment: occasional  . Drug Use: No  . Sexual Activity: Yes  Other Topics Concern  . Not on file   Social History Narrative   Grew up in [place]   Moved to [place] 1990    Family History  Problem Relation Age of Onset  . Hyperlipidemia Mother   . Alcohol abuse Father     smoked  . Arthritis Father   . Lupus Paternal Grandmother   . Arthritis Maternal Grandmother   . Ulcers Maternal Grandmother   . Alzheimer's disease Maternal Grandfather     BP 121/75  Pulse 61  Ht 5\' 9"  (1.753 m)  Wt 235 lb (106.595 kg)  BMI 34.69 kg/m2  Review of Systems: See HPI above.    Objective:  Physical Exam:  Gen: NAD Exam not repeated today.  Right shoulder: No swelling, ecchymoses.  No gross deformity. No TTP. FROM with pain on external rotation, 4/5 strength. Negative Hawkins, Neers. Negative Speeds, Yergasons. Strength 5/5 with empty can and resisted internal rotation.  Pain external rotation. Negative  apprehension. Negative O'briens. NV intact distally.    Assessment & Plan:  1. Right rotator cuff strain - concern about partial tear or strain specifically of infraspinatus given his exam, ultrasound.  Should do well with conservative treatment.  Injection given today.  Icing, nsaids.  Continue home exercises.  Consider physical therapy, further imaging, and/or nitro patches if not improving.  Follow up with me in 4-5 weeks.  After informed written consent, patient was seated on exam table. Right shoulder was prepped with alcohol swab and utilizing posterior approach, patient's right subacromial space was injected with 3:1 marcaine: depomedrol. Patient tolerated the procedure well without immediate complications.

## 2014-06-01 NOTE — Assessment & Plan Note (Signed)
Right rotator cuff strain - concern about partial tear or strain specifically of infraspinatus given his exam, ultrasound.  Should do well with conservative treatment.  Injection given today.  Icing, nsaids.  Continue home exercises.  Consider physical therapy, further imaging, and/or nitro patches if not improving.  Follow up with me in 4-5 weeks.  After informed written consent, patient was seated on exam table. Right shoulder was prepped with alcohol swab and utilizing posterior approach, patient's right subacromial space was injected with 3:1 marcaine: depomedrol. Patient tolerated the procedure well without immediate complications.

## 2014-06-21 ENCOUNTER — Ambulatory Visit: Payer: Managed Care, Other (non HMO) | Admitting: Family Medicine

## 2014-07-06 ENCOUNTER — Ambulatory Visit (INDEPENDENT_AMBULATORY_CARE_PROVIDER_SITE_OTHER): Payer: Managed Care, Other (non HMO) | Admitting: Psychology

## 2014-07-06 DIAGNOSIS — F331 Major depressive disorder, recurrent, moderate: Secondary | ICD-10-CM

## 2014-07-07 ENCOUNTER — Ambulatory Visit: Payer: Managed Care, Other (non HMO) | Admitting: Psychology

## 2014-07-19 ENCOUNTER — Ambulatory Visit (INDEPENDENT_AMBULATORY_CARE_PROVIDER_SITE_OTHER): Payer: Managed Care, Other (non HMO) | Admitting: Psychology

## 2014-07-19 DIAGNOSIS — F331 Major depressive disorder, recurrent, moderate: Secondary | ICD-10-CM

## 2014-08-16 ENCOUNTER — Ambulatory Visit (INDEPENDENT_AMBULATORY_CARE_PROVIDER_SITE_OTHER): Payer: Managed Care, Other (non HMO) | Admitting: Psychology

## 2014-08-16 DIAGNOSIS — F332 Major depressive disorder, recurrent severe without psychotic features: Secondary | ICD-10-CM

## 2014-09-26 ENCOUNTER — Other Ambulatory Visit: Payer: Self-pay | Admitting: Family Medicine

## 2014-09-27 ENCOUNTER — Other Ambulatory Visit: Payer: Self-pay | Admitting: Family Medicine

## 2014-09-28 ENCOUNTER — Other Ambulatory Visit: Payer: Self-pay

## 2014-09-28 DIAGNOSIS — Z8639 Personal history of other endocrine, nutritional and metabolic disease: Secondary | ICD-10-CM

## 2014-09-28 MED ORDER — TESTOSTERONE CYPIONATE 200 MG/ML IM SOLN: INTRAMUSCULAR | Status: DC

## 2014-09-28 NOTE — Telephone Encounter (Signed)
Please advise refill? Last RX was done on 05-11-14 with 0 refills  If ok fax to [phone]

## 2014-09-29 MED ORDER — TESTOSTERONE 30 MG/ACT TD SOLN: 90.0000 mg | Freq: Every day | TRANSDERMAL | Status: DC

## 2014-09-29 NOTE — Telephone Encounter (Signed)
Refill approved by Dr. Abner GreenspanBlyth. Signed by cody in Dr. Mariel AloeBlyth's absence. JG//CMA

## 2014-09-29 NOTE — Addendum Note (Signed)
Addended by: Amado CoeGLOVER, Naia Ruff A on: 09/29/2014 03:40 PM   Modules accepted: Orders

## 2014-09-29 NOTE — Telephone Encounter (Signed)
Script printed, signed by Selena Battenody in Dr. Elby ShowersBlyths absence. [facility]. JG//CMA

## 2014-10-26 ENCOUNTER — Encounter: Payer: Self-pay | Admitting: Family Medicine

## 2014-10-27 NOTE — Telephone Encounter (Signed)
Please advise on which labs you would like ordered.       KP

## 2015-01-18 ENCOUNTER — Ambulatory Visit (INDEPENDENT_AMBULATORY_CARE_PROVIDER_SITE_OTHER): Payer: Managed Care, Other (non HMO) | Admitting: Medical

## 2015-01-18 ENCOUNTER — Other Ambulatory Visit: Payer: Self-pay | Admitting: Medical

## 2015-01-18 ENCOUNTER — Encounter: Payer: Self-pay | Admitting: Medical

## 2015-01-18 ENCOUNTER — Ambulatory Visit (HOSPITAL_BASED_OUTPATIENT_CLINIC_OR_DEPARTMENT_OTHER)
Admission: RE | Admit: 2015-01-18 | Discharge: 2015-01-18 | Disposition: A | Discharge status: Home / Self Care | Payer: Managed Care, Other (non HMO) | Source: Ambulatory Visit | Attending: Medical | Admitting: Medical

## 2015-01-18 ENCOUNTER — Telehealth: Payer: Self-pay | Admitting: Medical

## 2015-01-18 VITALS — BP 126/71 | HR 55 | Temp 97.8°F | Ht 69.0 in | Wt 231.4 lb

## 2015-01-18 DIAGNOSIS — M2550 Pain in unspecified joint: Secondary | ICD-10-CM

## 2015-01-18 DIAGNOSIS — M25561 Pain in right knee: Secondary | ICD-10-CM | POA: Insufficient documentation

## 2015-01-18 DIAGNOSIS — G8929 Other chronic pain: Secondary | ICD-10-CM

## 2015-01-18 DIAGNOSIS — M25551 Pain in right hip: Secondary | ICD-10-CM

## 2015-01-18 DIAGNOSIS — M25559 Pain in unspecified hip: Secondary | ICD-10-CM | POA: Insufficient documentation

## 2015-01-18 DIAGNOSIS — M25569 Pain in unspecified knee: Secondary | ICD-10-CM

## 2015-01-18 MED ORDER — DICLOFENAC SODIUM 75 MG PO TBEC: 75.0000 mg | DELAYED_RELEASE_TABLET | Freq: Two times a day (BID) | ORAL | Status: DC

## 2015-01-18 MED ORDER — TRAMADOL HCL 50 MG PO TABS
50.0000 mg | ORAL_TABLET | Freq: Three times a day (TID) | ORAL | Status: DC | PRN
Start: 2015-01-18 — End: 2015-10-09

## 2015-01-18 NOTE — Assessment & Plan Note (Signed)
Rx diclofenac and tramadol xray of hip.

## 2015-01-18 NOTE — Telephone Encounter (Signed)
I notified pt of his xray results. Severe degeneration of the knee.I offered referral. He states he needs to talk to work to figure out what timeframe he could go. To help him out could try to schedule particular day of week or particular hour if that is more convenient for him.(he just needs to let us know and will see if ortho can accommodate limitations)

## 2015-01-18 NOTE — Assessment & Plan Note (Signed)
Xray of knee. Rx diclofenac and tramadol.

## 2015-01-18 NOTE — Addendum Note (Signed)
Addended by: Eustace QuailEABOLD, Rube Sanchez J on: 01/18/2015 09:55 AM   Modules accepted: Orders

## 2015-01-18 NOTE — Progress Notes (Signed)
Subjective:    Patient ID: Dustin Spears, male    DOB: 12/27/1965, 49 y.o.   MRN: [id number]  HPI  Pt in states pain in rt hip area. Pain shoot toward rt hamstring and rt thigh at times. Pain is for about 2 months. Pain getting little worse and constant. Also some general more body aches such as in shoulders and rt knee.(Knee pain has been for years and he uses knee brace).  Pt level at times 7-8 with attivity. He works as [employer]. So heavy lifting and bending over causes pain.  Pt tried naproxyn and tylenol. It helps a little bit.  Review of Systems  Constitutional: Negative for fever, chills and fatigue.  Respiratory: Negative for cough, chest tightness and wheezing.   Cardiovascular: Negative for chest pain.  Gastrointestinal: Negative for abdominal pain.  Musculoskeletal:       Rt hip, shoulders and rt knee pain.  Neurological: Negative for numbness.  Hematological: Negative for adenopathy. Does not bruise/bleed easily.  Psychiatric/Behavioral: Negative for behavioral problems and confusion.   Past Medical History  Diagnosis Date  . Hyperlipidemia   . Allergy     seasonal  . Thyroid disease   . Overweight(278.02) 02/26/2011  . Knee pain, right   . History of chicken pox   . SNORING 03/12/2010  . SLEEP APNEA, OBSTRUCTIVE 04/24/2010  . ALLERGIC RHINITIS 03/12/2010  . FATIGUE 03/12/2010  . Headache(784.0) 03/12/2010  . Neck pain, acute 03/12/2011  . Testosterone deficiency 10/19/2011  . Tendonitis 05/19/2012  . Sports physical 07/16/2012  . Preventative health care 11/04/2012    History   Social History  . Marital Status: Married    Spouse Name: N/A  . Number of Children: 1  . Years of Education: N/A   Occupational History  . 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 [employer]    Social History Main Topics  . Smoking status: Never Smoker   . Smokeless tobacco: Never Used  . Alcohol Use: Yes     Comment: occasional  . Drug Use: No  . Sexual Activity: Yes   Other Topics Concern    . Not on file   Social History Narrative   Grew up in [place]   Moved to [place] 1990    Past Surgical History  Procedure Laterality Date  . Bicep tendon in left arm  2000    Family History  Problem Relation Age of Onset  . Hyperlipidemia Mother   . Alcohol abuse Father     smoked  . Arthritis Father   . Lupus Paternal Grandmother   . Arthritis Maternal Grandmother   . Ulcers Maternal Grandmother   . Alzheimer's disease Maternal Grandfather     No Known Allergies  Current Outpatient Prescriptions on File Prior to Visit  Medication Sig Dispense Refill  . B-D 3CC LUER-LOK SYR 23GX1-1/2 23G X 1-1/2" 3 ML MISC USE EVERY 2 WEEKS WITH TESTOSTERONE INJECTION 25 each 1  . Calcium Citrate-Vitamin D 200-250 MG-UNIT TABS Take 1 capsule by mouth daily. 120 tablet 0  . cetirizine (ZYRTEC) 10 MG tablet Take 10 mg by mouth daily.    . fish oil-omega-3 fatty acids 1000 MG capsule Take 2 g by mouth daily.    Marland Kitchen. glucosamine-chondroitin 500-400 MG tablet Take 1 tablet by mouth daily.      Marland Kitchen. levothyroxine (SYNTHROID, LEVOTHROID) 50 MCG tablet TAKE 1 TABLET BY MOUTH EVERY DAY 90 tablet 1  . Misc. Devices (NASADOCK) MISC by Does not apply route.    .Marland Kitchen  naproxen (NAPROSYN) 500 MG tablet Take 1 tablet (500 mg total) by mouth 2 (two) times daily with a meal. Prn for pain. Take w/ food 60 tablet 1  . Syringe, Disposable, (B-D SYRINGE LUER-LOK 3CC) 3 ML MISC Use with testosterone injection every 2 weeks. 1 ml 25 each 2  . testosterone cypionate (DEPOTESTOTERONE CYPIONATE) 200 MG/ML injection INJECT 1 ML INTO THE MUSCLE EVERY 14 DAYS 10 mL 0  . Wheat Dextrin (BENEFIBER) POWD 2 tsp po daily in liquids 730 g 0  . Testosterone (AXIRON) 30 MG/ACT SOLN Place 90 mg onto the skin daily. (Patient not taking: Reported on 01/18/2015) 90 mL 3   No current facility-administered medications on file prior to visit.    BP 126/71 mmHg  Pulse 55  Temp(Src) 97.8 F (36.6 C) (Oral)  Ht  (1.753 m)  Wt 231 lb 6.4 oz  (104.962 kg)  BMI 34.16 kg/m2  SpO2 100%      Objective:   Physical Exam  General- No acute distress. Pleasant patient. Neck- Full range of motion, no jvd Lungs- Clear, even and unlabored. Heart- regular rate and rhythm. Neurologic- CNII- XII grossly intact. Rt hip- mild pain on palpation. On rom mild pain as well.(abduction, adduction and rotation.) No crepitus. Rt knee- from, faint pain, no crepitus. No swelling. Lt shoulder- FROM. No crepitus. Rt shoulder- from. Mild pain anterior aspect. Rt hamstring- Is tight on stretching. Mild pain.  Rt shoulder- from, no creptius. Mild anterior aspect faint tende.r     Assessment & Plan:

## 2015-01-18 NOTE — Assessment & Plan Note (Signed)
Rt hip, rt knee and shoulders. Feeling stiff daily. So will get arthritis panel.

## 2015-01-18 NOTE — Patient Instructions (Addendum)
For your knee pain and hip region pain we will get xrays.  Since other joints hurt as well will get arthritis panel today.  Rx of diclofenac for inflammation and pain. DC other nsaids. Rx tramadol  Follow up with Dr. Abner GreenspanBlyth as scheduled next couple of weeks or as needed.   Note I did not refill any other meds today. He was not out of any rx. And he will see pcp in couple of weeks.

## 2015-01-18 NOTE — Progress Notes (Signed)
Pre visit review using our clinic review tool, if applicable. No additional management support is needed unless otherwise documented below in the visit note. 

## 2015-01-19 ENCOUNTER — Telehealth: Payer: Self-pay | Admitting: Family Medicine

## 2015-01-19 DIAGNOSIS — M25561 Pain in right knee: Secondary | ICD-10-CM

## 2015-01-19 LAB — ARTHRITIS PANEL
Anti Nuclear Antibody(ANA): NEGATIVE
Rhuematoid fact SerPl-aCnc: <10 IU/mL (ref <=14)
Sed Rate: 1 mm/hr (ref 0–15)
Uric Acid, Serum: 5.1 mg/dL (ref 4.0–7.8)

## 2015-01-19 NOTE — Telephone Encounter (Signed)
Caller name: Alycia Pattenierce, Vick Relation to pt: self Call back number: [phone]   Reason for call:  [facility]

## 2015-01-19 NOTE — Telephone Encounter (Signed)
Referral to ortho

## 2015-01-30 ENCOUNTER — Ambulatory Visit (INDEPENDENT_AMBULATORY_CARE_PROVIDER_SITE_OTHER): Payer: Managed Care, Other (non HMO) | Admitting: Family Medicine

## 2015-01-30 ENCOUNTER — Encounter: Payer: Self-pay | Admitting: Family Medicine

## 2015-01-30 ENCOUNTER — Ambulatory Visit: Payer: Managed Care, Other (non HMO) | Admitting: Family Medicine

## 2015-01-30 VITALS — BP 110/80 | HR 56 | Temp 98.5°F | Ht 69.0 in | Wt 233.0 lb

## 2015-01-30 DIAGNOSIS — R7989 Other specified abnormal findings of blood chemistry: Secondary | ICD-10-CM

## 2015-01-30 DIAGNOSIS — E039 Hypothyroidism, unspecified: Secondary | ICD-10-CM

## 2015-01-30 DIAGNOSIS — E291 Testicular hypofunction: Secondary | ICD-10-CM | POA: Diagnosis not present

## 2015-01-30 DIAGNOSIS — E349 Endocrine disorder, unspecified: Secondary | ICD-10-CM

## 2015-01-30 DIAGNOSIS — E782 Mixed hyperlipidemia: Secondary | ICD-10-CM

## 2015-01-30 DIAGNOSIS — E785 Hyperlipidemia, unspecified: Secondary | ICD-10-CM

## 2015-01-30 DIAGNOSIS — E663 Overweight: Secondary | ICD-10-CM | POA: Diagnosis not present

## 2015-01-30 MED ORDER — LEVOTHYROXINE SODIUM 50 MCG PO TABS: 50.0000 ug | ORAL_TABLET | Freq: Every day | ORAL | Status: DC

## 2015-01-30 MED ORDER — TESTOSTERONE CYPIONATE 200 MG/ML IM SOLN: INTRAMUSCULAR | Status: DC

## 2015-01-30 NOTE — Progress Notes (Signed)
Pre visit review using our clinic review tool, if applicable. No additional management support is needed unless otherwise documented below in the visit note. 

## 2015-01-30 NOTE — Patient Instructions (Signed)
Testosterone This test is used to determine if your testosterone level is abnormal. This could be used to explain difficulty getting an erection (erectile dysfunction), inability of your partner to get pregnant (infertility), premature or delayed puberty if you are male, or the appearance of masculine physical features if you are male. PREPARATION FOR TEST A blood sample is obtained by inserting a needle into a vein in the arm. NORMAL FINDINGS  Free Testosterone: 0.3-2 pg/mL  % Free Testosterone: 0.1%-0.3% Total Testosterone:  7 mos-9 yrs (Tanner Stage I)  Male: Less than 30 ng/dL  Male: Less than 30 ng/dL  10-13 yrs (Tanner Stage II)  Male: Less than 300 ng/dL  Male: Less than 40 ng/dL  14-15 yrs (Tanner Stage III)  Male: 170-540 ng/dL  Male: Less than 60 ng/dL  16-19 yrs (Tanner Stage IV, V)  Male: 250-910 ng/dL  Male: Less than 70 ng/dL  20 yrs and over  Male: 280-1080 ng/dL  Male: Less than 70 ng/dL Ranges for normal findings may vary among different laboratories and hospitals. You should always check with your doctor after having lab work or other tests done to discuss the meaning of your test results and whether your values are considered within normal limits. MEANING OF TEST  Your caregiver will go over the test results with you and discuss the importance and meaning of your results, as well as treatment options and the need for additional tests if necessary. OBTAINING THE TEST RESULTS It is your responsibility to obtain your test results. Ask the lab or department performing the test when and how you will get your results. Document Released: 10/23/2004 Document Revised: 12/29/2011 Document Reviewed: 02/01/2014 ExitCare Patient Information 2015 [employer]. This information is not intended to replace advice given to you by your health care provider. Make sure you discuss any questions you have with your health care provider.  

## 2015-01-31 LAB — COMPREHENSIVE METABOLIC PANEL
ALBUMIN: 4.5 g/dL (ref 3.5–5.2)
ALT: 33 U/L (ref 0–53)
AST: 29 U/L (ref 0–37)
Alkaline Phosphatase: 32 U/L — ABNORMAL LOW (ref 39–117)
BUN: 25 mg/dL — ABNORMAL HIGH (ref 6–23)
CO2: 22 mEq/L (ref 19–32)
Calcium: 9.8 mg/dL (ref 8.4–10.5)
Chloride: 107 mEq/L (ref 96–112)
Creatinine, Ser: 0.91 mg/dL (ref 0.40–1.50)
GFR: 93.74 mL/min (ref >60.00)
Glucose, Bld: 83 mg/dL (ref 70–99)
POTASSIUM: 4.3 meq/L (ref 3.5–5.1)
Sodium: 137 mEq/L (ref 135–145)
TOTAL PROTEIN: 7.7 g/dL (ref 6.0–8.3)
Total Bilirubin: 0.6 mg/dL (ref 0.2–1.2)

## 2015-01-31 LAB — LIPID PANEL
Cholesterol: 221 mg/dL — ABNORMAL HIGH (ref 0–200)
HDL: 40.5 mg/dL (ref >39.00)
NONHDL: 180.5
Total CHOL/HDL Ratio: 5
Triglycerides: 207 mg/dL — ABNORMAL HIGH (ref 0.0–149.0)
VLDL: 41.4 mg/dL — ABNORMAL HIGH (ref 0.0–40.0)

## 2015-01-31 LAB — CBC
HCT: 48.5 % (ref 39.0–52.0)
Hemoglobin: 16.4 g/dL (ref 13.0–17.0)
MCHC: 33.9 g/dL (ref 30.0–36.0)
MCV: 86.7 fl (ref 78.0–100.0)
PLATELETS: 223 10*3/uL (ref 150.0–400.0)
RBC: 5.59 Mil/uL (ref 4.22–5.81)
RDW: 13.3 % (ref 11.5–15.5)
WBC: 7.4 10*3/uL (ref 4.0–10.5)

## 2015-01-31 LAB — LDL CHOLESTEROL, DIRECT: LDL DIRECT: 154 mg/dL

## 2015-01-31 LAB — TESTOSTERONE: TESTOSTERONE: 225.18 ng/dL — AB (ref 300.00–890.00)

## 2015-01-31 LAB — TSH: TSH: 3.94 u[IU]/mL (ref 0.35–4.50)

## 2015-02-05 ENCOUNTER — Encounter: Payer: Self-pay | Admitting: Family Medicine

## 2015-02-05 NOTE — Assessment & Plan Note (Signed)
On Levothyroxine, continue to monitor 

## 2015-02-05 NOTE — Progress Notes (Signed)
Dustin PattenBrian Spears  [id number] [birth year] 02/05/2015      Progress Note-Follow Up  Subjective  Chief Complaint  Chief Complaint  Patient presents with  . Follow-up    HPI  Patient is a 49 y.o. male in today for routine medical care. Patient is in today for follow-up. Overall doing well but he continues to struggle with various aches and pains. Has back pain, shoulder pain. No recent injury or trauma.. Is manageable and he continues to work. No recent illness. Denies CP/palp/SOB/HA/congestion/fevers/GI or GU c/o. Taking meds as prescribed  Past Medical History  Diagnosis Date  . Hyperlipidemia   . Allergy     seasonal  . Thyroid disease   . Overweight(278.02) 02/26/2011  . Knee pain, right   . History of chicken pox   . SNORING 03/12/2010  . SLEEP APNEA, OBSTRUCTIVE 04/24/2010  . ALLERGIC RHINITIS 03/12/2010  . FATIGUE 03/12/2010  . Headache(784.0) 03/12/2010  . Neck pain, acute 03/12/2011  . Testosterone deficiency 10/19/2011  . Tendonitis 05/19/2012  . Sports physical 07/16/2012  . Preventative health care 11/04/2012    Past Surgical History  Procedure Laterality Date  . Bicep tendon in left arm  2000    Family History  Problem Relation Age of Onset  . Hyperlipidemia Mother   . Alcohol abuse Father     smoked  . Arthritis Father   . Lupus Paternal Grandmother   . Arthritis Maternal Grandmother   . Ulcers Maternal Grandmother   . Alzheimer's disease Maternal Grandfather     History   Social History  . Marital Status: Married    Spouse Name: N/A  . Number of Children: 1  . Years of Education: N/A   Occupational History  . 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 [employer]    Social History Main Topics  . Smoking status: Never Smoker   . Smokeless tobacco: Never Used  . Alcohol Use: Yes     Comment: occasional  . Drug Use: No  . Sexual Activity: Yes   Other Topics Concern  . Not on file   Social History Narrative   Grew up in [place]   Moved to [place] 1990    Current  Outpatient Prescriptions on File Prior to Visit  Medication Sig Dispense Refill  . B-D 3CC LUER-LOK SYR 23GX1-1/2 23G X 1-1/2" 3 ML MISC USE EVERY 2 WEEKS WITH TESTOSTERONE INJECTION 25 each 1  . Calcium Citrate-Vitamin D 200-250 MG-UNIT TABS Take 1 capsule by mouth daily. 120 tablet 0  . cetirizine (ZYRTEC) 10 MG tablet Take 10 mg by mouth daily.    . diclofenac (VOLTAREN) 75 MG EC tablet Take 1 tablet (75 mg total) by mouth 2 (two) times daily. 30 tablet 0  . fish oil-omega-3 fatty acids 1000 MG capsule Take 2 g by mouth daily.    Marland Kitchen. glucosamine-chondroitin 500-400 MG tablet Take 1 tablet by mouth daily.      . Misc. Devices (NASADOCK) MISC by Does not apply route.    . Syringe, Disposable, (B-D SYRINGE LUER-LOK 3CC) 3 ML MISC Use with testosterone injection every 2 weeks. 1 ml 25 each 2  . traMADol (ULTRAM) 50 MG tablet Take 1 tablet (50 mg total) by mouth every 8 (eight) hours as needed. 30 tablet 0  . Wheat Dextrin (BENEFIBER) POWD 2 tsp po daily in liquids 730 g 0   No current facility-administered medications on file prior to visit.    No Known Allergies  Review of Systems  Review of Systems  Constitutional: Negative for fever and malaise/fatigue.  HENT: Negative for congestion.   Eyes: Negative for discharge.  Respiratory: Negative for shortness of breath.   Cardiovascular: Negative for chest pain, palpitations and leg swelling.  Gastrointestinal: Negative for nausea, abdominal pain and diarrhea.  Genitourinary: Negative for dysuria.  Musculoskeletal: Positive for back pain and joint pain. Negative for falls.  Skin: Negative for rash.  Neurological: Negative for loss of consciousness and headaches.  Endo/Heme/Allergies: Negative for polydipsia.  Psychiatric/Behavioral: Negative for depression and suicidal ideas. The patient is not nervous/anxious and does not have insomnia.     Objective  BP 110/80 mmHg  Pulse 56  Temp(Src) 98.5 F (36.9 C) (Oral)  Ht  (1.753 m)   Wt 233 lb (105.688 kg)  BMI 34.39 kg/m2  SpO2 97%  Physical Exam  Physical Exam  Constitutional: He is oriented to person, place, and time and well-developed, well-nourished, and in no distress. No distress.  HENT:  Head: Normocephalic and atraumatic.  Eyes: Conjunctivae are normal.  Neck: Neck supple. No thyromegaly present.  Cardiovascular: Normal rate, regular rhythm and normal heart sounds.   No murmur heard. Pulmonary/Chest: Effort normal and breath sounds normal. No respiratory distress.  Abdominal: He exhibits no distension and no mass. There is no tenderness.  Musculoskeletal: He exhibits no edema.  Neurological: He is alert and oriented to person, place, and time.  Skin: Skin is warm.  Psychiatric: Memory, affect and judgment normal.    Lab Results  Component Value Date   TSH 3.94 01/30/2015   Lab Results  Component Value Date   WBC 7.4 01/30/2015   HGB 16.4 01/30/2015   HCT 48.5 01/30/2015   MCV 86.7 01/30/2015   PLT 223.0 01/30/2015   Lab Results  Component Value Date   CREATININE 0.91 01/30/2015   BUN 25* 01/30/2015   NA 137 01/30/2015   K 4.3 01/30/2015   CL 107 01/30/2015   CO2 22 01/30/2015   Lab Results  Component Value Date   ALT 33 01/30/2015   AST 29 01/30/2015   ALKPHOS 32* 01/30/2015   BILITOT 0.6 01/30/2015   Lab Results  Component Value Date   CHOL 221* 01/30/2015   Lab Results  Component Value Date   HDL 40.50 01/30/2015   Lab Results  Component Value Date   LDLCALC 98 04/17/2014   Lab Results  Component Value Date   TRIG 207.0* 01/30/2015   Lab Results  Component Value Date   CHOLHDL 5 01/30/2015     Assessment & Plan  Hypothyroidism On Levothyroxine, continue to monitor   Overweight Encouraged DASH diet, decrease po intake and increase exercise as tolerated. Needs 7-8 hours of sleep nightly. Avoid trans fats, eat small, frequent meals every 4-5 hours with lean proteins, complex carbs and healthy fats. Minimize  simple carbs, GMO foods.   Hyperlipidemia Encouraged heart healthy diet, increase exercise, avoid trans fats, consider a krill oil cap daily   Testosterone deficiency Will continue shots for now but may consider switching back to topical treatments

## 2015-02-05 NOTE — Assessment & Plan Note (Signed)
Encouraged heart healthy diet, increase exercise, avoid trans fats, consider a krill oil cap daily 

## 2015-02-05 NOTE — Assessment & Plan Note (Signed)
Will continue shots for now but may consider switching back to topical treatments

## 2015-02-05 NOTE — Assessment & Plan Note (Signed)
Encouraged DASH diet, decrease po intake and increase exercise as tolerated. Needs 7-8 hours of sleep nightly. Avoid trans fats, eat small, frequent meals every 4-5 hours with lean proteins, complex carbs and healthy fats. Minimize simple carbs, GMO foods. 

## 2015-02-06 ENCOUNTER — Ambulatory Visit (HOSPITAL_COMMUNITY)
Admission: RE | Admit: 2015-02-06 | Discharge: 2015-02-06 | Disposition: A | Discharge status: Home / Self Care | Payer: Managed Care, Other (non HMO) | Source: Ambulatory Visit | Attending: Orthopedic Surgery | Admitting: Orthopedic Surgery

## 2015-02-06 ENCOUNTER — Other Ambulatory Visit (HOSPITAL_COMMUNITY): Payer: Self-pay | Admitting: Orthopedic Surgery

## 2015-02-06 DIAGNOSIS — X58XXXA Exposure to other specified factors, initial encounter: Secondary | ICD-10-CM | POA: Diagnosis not present

## 2015-02-06 DIAGNOSIS — M25511 Pain in right shoulder: Secondary | ICD-10-CM

## 2015-02-06 DIAGNOSIS — T1590XA Foreign body on external eye, part unspecified, unspecified eye, initial encounter: Secondary | ICD-10-CM | POA: Insufficient documentation

## 2015-02-28 ENCOUNTER — Other Ambulatory Visit: Payer: Self-pay | Admitting: Medical

## 2015-03-05 NOTE — Telephone Encounter (Signed)
Patient calling in [facility] [place]

## 2015-03-06 ENCOUNTER — Other Ambulatory Visit: Payer: Self-pay | Admitting: Medical

## 2015-03-06 ENCOUNTER — Other Ambulatory Visit: Payer: Self-pay

## 2015-03-06 MED ORDER — DICLOFENAC SODIUM 75 MG PO TBEC: 75.0000 mg | DELAYED_RELEASE_TABLET | Freq: Two times a day (BID) | ORAL | Status: DC

## 2015-03-26 ENCOUNTER — Other Ambulatory Visit: Payer: Self-pay | Admitting: Family Medicine

## 2015-03-26 MED ORDER — LEVOTHYROXINE SODIUM 50 MCG PO TABS: 50.0000 ug | ORAL_TABLET | Freq: Every day | ORAL | Status: DC

## 2015-03-28 ENCOUNTER — Other Ambulatory Visit: Payer: Self-pay | Admitting: Family Medicine

## 2015-03-29 NOTE — Telephone Encounter (Signed)
Faxed hardcopy for Testosterone to [facility] [place]

## 2015-04-24 ENCOUNTER — Telehealth: Payer: Self-pay | Admitting: Family Medicine

## 2015-04-24 NOTE — Telephone Encounter (Signed)
pre visit letter mailed 04/24/15 °

## 2015-05-15 ENCOUNTER — Encounter: Payer: Self-pay | Admitting: Family Medicine

## 2015-05-15 ENCOUNTER — Ambulatory Visit (INDEPENDENT_AMBULATORY_CARE_PROVIDER_SITE_OTHER): Payer: Managed Care, Other (non HMO) | Admitting: Family Medicine

## 2015-05-15 VITALS — BP 126/80 | HR 61 | Temp 98.7°F | Ht 68.5 in | Wt 234.5 lb

## 2015-05-15 DIAGNOSIS — Z0000 Encounter for general adult medical examination without abnormal findings: Secondary | ICD-10-CM | POA: Diagnosis not present

## 2015-05-15 DIAGNOSIS — E039 Hypothyroidism, unspecified: Secondary | ICD-10-CM

## 2015-05-15 DIAGNOSIS — M25551 Pain in right hip: Secondary | ICD-10-CM

## 2015-05-15 DIAGNOSIS — M779 Enthesopathy, unspecified: Secondary | ICD-10-CM

## 2015-05-15 DIAGNOSIS — E291 Testicular hypofunction: Secondary | ICD-10-CM | POA: Diagnosis not present

## 2015-05-15 DIAGNOSIS — E349 Endocrine disorder, unspecified: Secondary | ICD-10-CM

## 2015-05-15 DIAGNOSIS — IMO0001 Reserved for inherently not codable concepts without codable children: Secondary | ICD-10-CM

## 2015-05-15 DIAGNOSIS — R030 Elevated blood-pressure reading, without diagnosis of hypertension: Secondary | ICD-10-CM | POA: Diagnosis not present

## 2015-05-15 DIAGNOSIS — E663 Overweight: Secondary | ICD-10-CM

## 2015-05-15 DIAGNOSIS — E785 Hyperlipidemia, unspecified: Secondary | ICD-10-CM

## 2015-05-15 NOTE — Patient Instructions (Signed)
Preventive Care for Adults A healthy lifestyle and preventive care can promote health and wellness. Preventive health guidelines for men include the following key practices:  A routine yearly physical is a good way to check with your health care provider about your health and preventative screening. It is a chance to share any concerns and updates on your health and to receive a thorough exam.  Visit your dentist for a routine exam and preventative care every 6 months. Brush your teeth twice a day and floss once a day. Good oral hygiene prevents tooth decay and gum disease.  The frequency of eye exams is based on your age, health, family medical history, use of contact lenses, and other factors. Follow your health care provider's recommendations for frequency of eye exams.  Eat a healthy diet. Foods such as vegetables, fruits, whole grains, low-fat dairy products, and lean protein foods contain the nutrients you need without too many calories. Decrease your intake of foods high in solid fats, added sugars, and salt. Eat the right amount of calories for you.Get information about a proper diet from your health care provider, if necessary.  Regular physical exercise is one of the most important things you can do for your health. Most adults should get at least 150 minutes of moderate-intensity exercise (any activity that increases your heart rate and causes you to sweat) each week. In addition, most adults need muscle-strengthening exercises on 2 or more days a week.  Maintain a healthy weight. The body mass index (BMI) is a screening tool to identify possible weight problems. It provides an estimate of body fat based on height and weight. Your health care provider can find your BMI and can help you achieve or maintain a healthy weight.For adults 20 years and older:  A BMI below 18.5 is considered underweight.  A BMI of 18.5 to 24.9 is normal.  A BMI of 25 to 29.9 is considered overweight.  A BMI  of 30 and above is considered obese.  Maintain normal blood lipids and cholesterol levels by exercising and minimizing your intake of saturated fat. Eat a balanced diet with plenty of fruit and vegetables. Blood tests for lipids and cholesterol should begin at age 50 and be repeated every 5 years. If your lipid or cholesterol levels are high, you are over 50, or you are at high risk for heart disease, you may need your cholesterol levels checked more frequently.Ongoing high lipid and cholesterol levels should be treated with medicines if diet and exercise are not working.  If you smoke, find out from your health care provider how to quit. If you do not use tobacco, do not start.  Lung cancer screening is recommended for adults aged 73-80 years who are at high risk for developing lung cancer because of a history of smoking. A yearly low-dose CT scan of the lungs is recommended for people who have at least a 30-pack-year history of smoking and are a current smoker or have quit within the past 15 years. A pack year of smoking is smoking an average of 1 pack of cigarettes a day for 1 year (for example: 1 pack a day for 30 years or 2 packs a day for 15 years). Yearly screening should continue until the smoker has stopped smoking for at least 15 years. Yearly screening should be stopped for people who develop a health problem that would prevent them from having lung cancer treatment.  If you choose to drink alcohol, do not have more than  2 drinks per day. One drink is considered to be 12 ounces (355 mL) of beer, 5 ounces (148 mL) of wine, or 1.5 ounces (44 mL) of liquor.  Avoid use of street drugs. Do not share needles with anyone. Ask for help if you need support or instructions about stopping the use of drugs.  High blood pressure causes heart disease and increases the risk of stroke. Your blood pressure should be checked at least every 1-2 years. Ongoing high blood pressure should be treated with  medicines, if weight loss and exercise are not effective.  If you are 45-79 years old, ask your health care provider if you should take aspirin to prevent heart disease.  Diabetes screening involves taking a blood sample to check your fasting blood sugar level. This should be done once every 3 years, after age 45, if you are within normal weight and without risk factors for diabetes. Testing should be considered at a younger age or be carried out more frequently if you are overweight and have at least 1 risk factor for diabetes.  Colorectal cancer can be detected and often prevented. Most routine colorectal cancer screening begins at the age of 50 and continues through age 75. However, your health care provider may recommend screening at an earlier age if you have risk factors for colon cancer. On a yearly basis, your health care provider may provide home test kits to check for hidden blood in the stool. Use of a small camera at the end of a tube to directly examine the colon (sigmoidoscopy or colonoscopy) can detect the earliest forms of colorectal cancer. Talk to your health care provider about this at age 50, when routine screening begins. Direct exam of the colon should be repeated every 5-10 years through age 75, unless early forms of precancerous polyps or small growths are found.  People who are at an increased risk for hepatitis B should be screened for this virus. You are considered at high risk for hepatitis B if:  You were born in a country where hepatitis B occurs often. Talk with your health care provider about which countries are considered high risk.  Your parents were born in a high-risk country and you have not received a shot to protect against hepatitis B (hepatitis B vaccine).  You have HIV or AIDS.  You use needles to inject street drugs.  You live with, or have sex with, someone who has hepatitis B.  You are a man who has sex with other men (MSM).  You get hemodialysis  treatment.  You take certain medicines for conditions such as cancer, organ transplantation, and autoimmune conditions.  Hepatitis C blood testing is recommended for all people born from 1945 through 1965 and any individual with known risks for hepatitis C.  Practice safe sex. Use condoms and avoid high-risk sexual practices to reduce the spread of sexually transmitted infections (STIs). STIs include gonorrhea, chlamydia, syphilis, trichomonas, herpes, HPV, and human immunodeficiency virus (HIV). Herpes, HIV, and HPV are viral illnesses that have no cure. They can result in disability, cancer, and death.  If you are at risk of being infected with HIV, it is recommended that you take a prescription medicine daily to prevent HIV infection. This is called preexposure prophylaxis (PrEP). You are considered at risk if:  You are a man who has sex with other men (MSM) and have other risk factors.  You are a heterosexual man, are sexually active, and are at increased risk for HIV infection.    You take drugs by injection.  You are sexually active with a partner who has HIV.  Talk with your health care provider about whether you are at high risk of being infected with HIV. If you choose to begin PrEP, you should first be tested for HIV. You should then be tested every 3 months for as long as you are taking PrEP.  A one-time screening for abdominal aortic aneurysm (AAA) and surgical repair of large AAAs by ultrasound are recommended for men ages 32 to 67 years who are current or former smokers.  Healthy men should no longer receive prostate-specific antigen (PSA) blood tests as part of routine cancer screening. Talk with your health care provider about prostate cancer screening.  Testicular cancer screening is not recommended for adult males who have no symptoms. Screening includes self-exam, a health care provider exam, and other screening tests. Consult with your health care provider about any symptoms  you have or any concerns you have about testicular cancer.  Use sunscreen. Apply sunscreen liberally and repeatedly throughout the day. You should seek shade when your shadow is shorter than you. Protect yourself by wearing long sleeves, pants, a wide-brimmed hat, and sunglasses year round, whenever you are outdoors.  Once a month, do a whole-body skin exam, using a mirror to look at the skin on your back. Tell your health care provider about new moles, moles that have irregular borders, moles that are larger than a pencil eraser, or moles that have changed in shape or color.  Stay current with required vaccines (immunizations).  Influenza vaccine. All adults should be immunized every year.  Tetanus, diphtheria, and acellular pertussis (Td, Tdap) vaccine. An adult who has not previously received Tdap or who does not know his vaccine status should receive 1 dose of Tdap. This initial dose should be followed by tetanus and diphtheria toxoids (Td) booster doses every 10 years. Adults with an unknown or incomplete history of completing a 3-dose immunization series with Td-containing vaccines should begin or complete a primary immunization series including a Tdap dose. Adults should receive a Td booster every 10 years.  Varicella vaccine. An adult without evidence of immunity to varicella should receive 2 doses or a second dose if he has previously received 1 dose.  Human papillomavirus (HPV) vaccine. Males aged 68-21 years who have not received the vaccine previously should receive the 3-dose series. Males aged 22-26 years may be immunized. Immunization is recommended through the age of 6 years for any male who has sex with males and did not get any or all doses earlier. Immunization is recommended for any person with an immunocompromised condition through the age of 49 years if he did not get any or all doses earlier. During the 3-dose series, the second dose should be obtained 4-8 weeks after the first  dose. The third dose should be obtained 24 weeks after the first dose and 16 weeks after the second dose.  Zoster vaccine. One dose is recommended for adults aged 50 years or older unless certain conditions are present.  Measles, mumps, and rubella (MMR) vaccine. Adults born before [birth year] generally are considered immune to measles and mumps. Adults born in [birth year] or later should have 1 or more doses of MMR vaccine unless there is a contraindication to the vaccine or there is laboratory evidence of immunity to each of the three diseases. A routine second dose of MMR vaccine should be obtained at least 28 days after the first dose for students attending postsecondary  schools, health care workers, or international travelers. People who received inactivated measles vaccine or an unknown type of measles vaccine during 1963-1967 should receive 2 doses of MMR vaccine. People who received inactivated mumps vaccine or an unknown type of mumps vaccine before 1979 and are at high risk for mumps infection should consider immunization with 2 doses of MMR vaccine. Unvaccinated health care workers born before 1957 who lack laboratory evidence of measles, mumps, or rubella immunity or laboratory confirmation of disease should consider measles and mumps immunization with 2 doses of MMR vaccine or rubella immunization with 1 dose of MMR vaccine.  Pneumococcal 13-valent conjugate (PCV13) vaccine. When indicated, a person who is uncertain of his immunization history and has no record of immunization should receive the PCV13 vaccine. An adult aged 19 years or older who has certain medical conditions and has not been previously immunized should receive 1 dose of PCV13 vaccine. This PCV13 should be followed with a dose of pneumococcal polysaccharide (PPSV23) vaccine. The PPSV23 vaccine dose should be obtained at least 8 weeks after the dose of PCV13 vaccine. An adult aged 19 years or older who has certain medical conditions and  previously received 1 or more doses of PPSV23 vaccine should receive 1 dose of PCV13. The PCV13 vaccine dose should be obtained 1 or more years after the last PPSV23 vaccine dose.  Pneumococcal polysaccharide (PPSV23) vaccine. When PCV13 is also indicated, PCV13 should be obtained first. All adults aged 65 years and older should be immunized. An adult younger than age 65 years who has certain medical conditions should be immunized. Any person who resides in a nursing home or long-term care facility should be immunized. An adult smoker should be immunized. People with an immunocompromised condition and certain other conditions should receive both PCV13 and PPSV23 vaccines. People with human immunodeficiency virus (HIV) infection should be immunized as soon as possible after diagnosis. Immunization during chemotherapy or radiation therapy should be avoided. Routine use of PPSV23 vaccine is not recommended for American Indians, [place] Natives, or people younger than 65 years unless there are medical conditions that require PPSV23 vaccine. When indicated, people who have unknown immunization and have no record of immunization should receive PPSV23 vaccine. One-time revaccination 5 years after the first dose of PPSV23 is recommended for people aged 19-64 years who have chronic kidney failure, nephrotic syndrome, asplenia, or immunocompromised conditions. People who received 1-2 doses of PPSV23 before age 65 years should receive another dose of PPSV23 vaccine at age 65 years or later if at least 5 years have passed since the previous dose. Doses of PPSV23 are not needed for people immunized with PPSV23 at or after age 65 years.  Meningococcal vaccine. Adults with asplenia or persistent complement component deficiencies should receive 2 doses of quadrivalent meningococcal conjugate (MenACWY-D) vaccine. The doses should be obtained at least 2 months apart. Microbiologists working with certain meningococcal bacteria,  military recruits, people at risk during an outbreak, and people who travel to or live in countries with a high rate of meningitis should be immunized. A first-year college student up through age 21 years who is living in a residence hall should receive a dose if he did not receive a dose on or after his 16th birthday. Adults who have certain high-risk conditions should receive one or more doses of vaccine.  Hepatitis A vaccine. Adults who wish to be protected from this disease, have certain high-risk conditions, work with hepatitis A-infected animals, work in hepatitis A research labs, or   travel to or work in countries with a high rate of hepatitis A should be immunized. Adults who were previously unvaccinated and who anticipate close contact with an international adoptee during the first 60 days after arrival in the [place] from a country with a high rate of hepatitis A should be immunized.  Hepatitis B vaccine. Adults should be immunized if they wish to be protected from this disease, have certain high-risk conditions, may be exposed to blood or other infectious body fluids, are household contacts or sex partners of hepatitis B positive people, are clients or workers in certain care facilities, or travel to or work in countries with a high rate of hepatitis B.  Haemophilus influenzae type b (Hib) vaccine. A previously unvaccinated person with asplenia or sickle cell disease or having a scheduled splenectomy should receive 1 dose of Hib vaccine. Regardless of previous immunization, a recipient of a hematopoietic stem cell transplant should receive a 3-dose series 6-12 months after his successful transplant. Hib vaccine is not recommended for adults with HIV infection. Preventive Service / Frequency Ages 52 to 17  Blood pressure check.** / Every 1 to 2 years.  Lipid and cholesterol check.** / Every 5 years beginning at age 69.  Hepatitis C blood test.** / For any individual with known risks for  hepatitis C.  Skin self-exam. / Monthly.  Influenza vaccine. / Every year.  Tetanus, diphtheria, and acellular pertussis (Tdap, Td) vaccine.** / Consult your health care provider. 1 dose of Td every 10 years.  Varicella vaccine.** / Consult your health care provider.  HPV vaccine. / 3 doses over 6 months, if 72 or younger.  Measles, mumps, rubella (MMR) vaccine.** / You need at least 1 dose of MMR if you were born in 1957 or later. You may also need a second dose.  Pneumococcal 13-valent conjugate (PCV13) vaccine.** / Consult your health care provider.  Pneumococcal polysaccharide (PPSV23) vaccine.** / 1 to 2 doses if you smoke cigarettes or if you have certain conditions.  Meningococcal vaccine.** / 1 dose if you are age 35 to 60 years and a Market researcher living in a residence hall, or have one of several medical conditions. You may also need additional booster doses.  Hepatitis A vaccine.** / Consult your health care provider.  Hepatitis B vaccine.** / Consult your health care provider.  Haemophilus influenzae type b (Hib) vaccine.** / Consult your health care provider. Ages 35 to 8  Blood pressure check.** / Every 1 to 2 years.  Lipid and cholesterol check.** / Every 5 years beginning at age 57.  Lung cancer screening. / Every year if you are aged 44-80 years and have a 30-pack-year history of smoking and currently smoke or have quit within the past 15 years. Yearly screening is stopped once you have quit smoking for at least 15 years or develop a health problem that would prevent you from having lung cancer treatment.  Fecal occult blood test (FOBT) of stool. / Every year beginning at age 55 and continuing until age 73. You may not have to do this test if you get a colonoscopy every 10 years.  Flexible sigmoidoscopy** or colonoscopy.** / Every 5 years for a flexible sigmoidoscopy or every 10 years for a colonoscopy beginning at age 28 and continuing until age  1.  Hepatitis C blood test.** / For all people born from [birth year] through 1965 and any individual with known risks for hepatitis C.  Skin self-exam. / Monthly.  Influenza vaccine. / Every  year.  Tetanus, diphtheria, and acellular pertussis (Tdap/Td) vaccine.** / Consult your health care provider. 1 dose of Td every 10 years.  Varicella vaccine.** / Consult your health care provider.  Zoster vaccine.** / 1 dose for adults aged 53 years or older.  Measles, mumps, rubella (MMR) vaccine.** / You need at least 1 dose of MMR if you were born in 1957 or later. You may also need a second dose.  Pneumococcal 13-valent conjugate (PCV13) vaccine.** / Consult your health care provider.  Pneumococcal polysaccharide (PPSV23) vaccine.** / 1 to 2 doses if you smoke cigarettes or if you have certain conditions.  Meningococcal vaccine.** / Consult your health care provider.  Hepatitis A vaccine.** / Consult your health care provider.  Hepatitis B vaccine.** / Consult your health care provider.  Haemophilus influenzae type b (Hib) vaccine.** / Consult your health care provider. Ages 77 and over  Blood pressure check.** / Every 1 to 2 years.  Lipid and cholesterol check.**/ Every 5 years beginning at age 85.  Lung cancer screening. / Every year if you are aged 55-80 years and have a 30-pack-year history of smoking and currently smoke or have quit within the past 15 years. Yearly screening is stopped once you have quit smoking for at least 15 years or develop a health problem that would prevent you from having lung cancer treatment.  Fecal occult blood test (FOBT) of stool. / Every year beginning at age 33 and continuing until age 11. You may not have to do this test if you get a colonoscopy every 10 years.  Flexible sigmoidoscopy** or colonoscopy.** / Every 5 years for a flexible sigmoidoscopy or every 10 years for a colonoscopy beginning at age 28 and continuing until age 73.  Hepatitis C blood  test.** / For all people born from [birth year] through 1965 and any individual with known risks for hepatitis C.  Abdominal aortic aneurysm (AAA) screening.** / A one-time screening for ages 50 to 27 years who are current or former smokers.  Skin self-exam. / Monthly.  Influenza vaccine. / Every year.  Tetanus, diphtheria, and acellular pertussis (Tdap/Td) vaccine.** / 1 dose of Td every 10 years.  Varicella vaccine.** / Consult your health care provider.  Zoster vaccine.** / 1 dose for adults aged 34 years or older.  Pneumococcal 13-valent conjugate (PCV13) vaccine.** / Consult your health care provider.  Pneumococcal polysaccharide (PPSV23) vaccine.** / 1 dose for all adults aged 63 years and older.  Meningococcal vaccine.** / Consult your health care provider.  Hepatitis A vaccine.** / Consult your health care provider.  Hepatitis B vaccine.** / Consult your health care provider.  Haemophilus influenzae type b (Hib) vaccine.** / Consult your health care provider. **Family history and personal history of risk and conditions may change your health care provider's recommendations. Document Released: 12/02/2001 Document Revised: 10/11/2013 Document Reviewed: 03/03/2011 [facility] Patient Information 2015 [place], [place]. This information is not intended to replace advice given to you by your health care provider. Make sure you discuss any questions you have with your health care provider.

## 2015-05-15 NOTE — Progress Notes (Signed)
Pre visit review using our clinic review tool, if applicable. No additional management support is needed unless otherwise documented below in the visit note. 

## 2015-05-16 ENCOUNTER — Other Ambulatory Visit (INDEPENDENT_AMBULATORY_CARE_PROVIDER_SITE_OTHER): Payer: Managed Care, Other (non HMO)

## 2015-05-16 DIAGNOSIS — E291 Testicular hypofunction: Secondary | ICD-10-CM | POA: Diagnosis not present

## 2015-05-16 LAB — CBC
HCT: 49.4 % (ref 39.0–52.0)
Hemoglobin: 16.6 g/dL (ref 13.0–17.0)
MCHC: 33.6 g/dL (ref 30.0–36.0)
MCV: 88 fl (ref 78.0–100.0)
Platelets: 254 10*3/uL (ref 150.0–400.0)
RBC: 5.61 Mil/uL (ref 4.22–5.81)
RDW: 13.6 % (ref 11.5–15.5)
WBC: 8.4 10*3/uL (ref 4.0–10.5)

## 2015-05-16 LAB — COMPREHENSIVE METABOLIC PANEL
ALK PHOS: 32 U/L — AB (ref 39–117)
ALT: 27 U/L (ref 0–53)
AST: 26 U/L (ref 0–37)
Albumin: 4.5 g/dL (ref 3.5–5.2)
BUN: 19 mg/dL (ref 6–23)
CALCIUM: 9.6 mg/dL (ref 8.4–10.5)
CHLORIDE: 106 meq/L (ref 96–112)
CO2: 24 meq/L (ref 19–32)
Creatinine, Ser: 0.88 mg/dL (ref 0.40–1.50)
GFR: 97.33 mL/min (ref >60.00)
Glucose, Bld: 78 mg/dL (ref 70–99)
Potassium: 4.1 mEq/L (ref 3.5–5.1)
SODIUM: 139 meq/L (ref 135–145)
Total Bilirubin: 0.5 mg/dL (ref 0.2–1.2)
Total Protein: 7.6 g/dL (ref 6.0–8.3)

## 2015-05-16 LAB — LDL CHOLESTEROL, DIRECT: Direct LDL: 147 mg/dL

## 2015-05-16 LAB — TESTOSTERONE: TESTOSTERONE: 609.02 ng/dL (ref 300.00–890.00)

## 2015-05-16 LAB — LIPID PANEL
CHOL/HDL RATIO: 7
Cholesterol: 219 mg/dL — ABNORMAL HIGH (ref 0–200)
HDL: 33.5 mg/dL — ABNORMAL LOW (ref >39.00)
NonHDL: 185.5
Triglycerides: 261 mg/dL — ABNORMAL HIGH (ref 0.0–149.0)
VLDL: 52.2 mg/dL — ABNORMAL HIGH (ref 0.0–40.0)

## 2015-05-16 LAB — TSH: TSH: 3.06 u[IU]/mL (ref 0.35–4.50)

## 2015-05-16 LAB — PSA: PSA: 0.64 ng/mL (ref 0.10–4.00)

## 2015-05-17 ENCOUNTER — Other Ambulatory Visit: Payer: Self-pay | Admitting: Family Medicine

## 2015-05-17 DIAGNOSIS — E349 Endocrine disorder, unspecified: Secondary | ICD-10-CM

## 2015-05-17 MED ORDER — TESTOSTERONE 50 MG/5GM (1%) TD GEL: TRANSDERMAL | Status: DC

## 2015-05-17 NOTE — Telephone Encounter (Signed)
Lab entered and prescription sent in for Androgel.

## 2015-05-18 ENCOUNTER — Other Ambulatory Visit: Payer: Self-pay | Admitting: *Deleted

## 2015-05-18 MED ORDER — TESTOSTERONE 20.25 MG/1.25GM (1.62%) TD GEL: TRANSDERMAL | Status: DC

## 2015-05-18 NOTE — Telephone Encounter (Signed)
Rx printed. MD signed.  Faxed to pharmacy.

## 2015-05-23 ENCOUNTER — Telehealth: Payer: Self-pay | Admitting: Family Medicine

## 2015-05-23 NOTE — Telephone Encounter (Signed)
Per Tammy, pts wife, [facility] [place] states they contacted our office 05/18/15 about needing prior auth on Androgel. Please start process as they are waiting to get med and cannot be billed without PA. Please call Tammy at [phone] to confirm PA started & received.

## 2015-05-24 NOTE — Telephone Encounter (Signed)
OK to proceed with Androderm patches /24 hours. Apply topically qd. Disp #30 with 3 rf

## 2015-05-24 NOTE — Telephone Encounter (Signed)
PA has been initiated through [employer]. Awaiting determination. [id number]//[email or web address]

## 2015-05-24 NOTE — Telephone Encounter (Signed)
PA denied. Alternatives are Androderm and Axiron. Please advise. [id number]//CMA

## 2015-05-25 MED ORDER — TESTOSTERONE 2 MG/24HR TD PT24: 2.0000 mg | MEDICATED_PATCH | Freq: Every day | TRANSDERMAL | Status: DC

## 2015-05-25 NOTE — Telephone Encounter (Signed)
[facility] sent to [facility] [place] and patient notified.

## 2015-05-25 NOTE — Telephone Encounter (Signed)
Prescription printed would not send electronically. Put on counter for PCP signature will then fax hardcopy to [facility] [place].

## 2015-05-27 ENCOUNTER — Encounter: Payer: Self-pay | Admitting: Family Medicine

## 2015-05-27 NOTE — Progress Notes (Signed)
Dustin Spears  [id number] [birth year] 05/27/2015      Progress Note-Follow Up  Subjective  Chief Complaint  Chief Complaint  Patient presents with  . Annual Exam    HPI  Patient is a 49 y.o. male in today for routine medical care. Patient is in today for annual exam. Continues to struggle with fatigu. Also notes ongoing right shoulder and right hip pain. Notes hip pain is worse with bending. No recent illness. No new acute concern. Denies CP/palp/SOB/HA/congestion/fevers/GI or GU c/o. Taking meds as prescribed  Past Medical History  Diagnosis Date  . Hyperlipidemia   . Allergy     seasonal  . Thyroid disease   . Overweight(278.02) 02/26/2011  . Knee pain, right   . History of chicken pox   . SNORING 03/12/2010  . SLEEP APNEA, OBSTRUCTIVE 04/24/2010  . ALLERGIC RHINITIS 03/12/2010  . FATIGUE 03/12/2010  . Headache(784.0) 03/12/2010  . Neck pain, acute 03/12/2011  . Testosterone deficiency 10/19/2011  . Tendonitis 05/19/2012  . Sports physical 07/16/2012  . Preventative health care 11/04/2012    Past Surgical History  Procedure Laterality Date  . Bicep tendon in left arm  2000    Family History  Problem Relation Age of Onset  . Hyperlipidemia Mother   . Alcohol abuse Father     smoked  . Arthritis Father   . Lupus Paternal Grandmother   . Arthritis Maternal Grandmother   . Ulcers Maternal Grandmother   . Alzheimer's disease Maternal Grandfather     History   Social History  . Marital Status: Married    Spouse Name: N/A  . Number of Children: 1  . Years of Education: N/A   Occupational History  . 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 [employer]    Social History Main Topics  . Smoking status: Never Smoker   . Smokeless tobacco: Never Used  . Alcohol Use: Yes     Comment: occasional  . Drug Use: No  . Sexual Activity: Yes   Other Topics Concern  . Not on file   Social History Narrative   Grew up in [place] to [employer] [place]    Current Outpatient Prescriptions on  File Prior to Visit  Medication Sig Dispense Refill  . Calcium Citrate-Vitamin D 200-250 MG-UNIT TABS Take 1 capsule by mouth daily. 120 tablet 0  . cetirizine (ZYRTEC) 10 MG tablet Take 10 mg by mouth daily.    . diclofenac (VOLTAREN) 75 MG EC tablet TAKE 1 TABLET (75 MG TOTAL) BY MOUTH 2 (TWO) TIMES DAILY. 30 tablet 0  . diclofenac (VOLTAREN) 75 MG EC tablet Take 1 tablet (75 mg total) by mouth 2 (two) times daily. 30 tablet 0  . fish oil-omega-3 fatty acids 1000 MG capsule Take 2 g by mouth daily.    Marland Kitchen glucosamine-chondroitin 500-400 MG tablet Take 1 tablet by mouth daily.      Marland Kitchen levothyroxine (SYNTHROID, LEVOTHROID) 50 MCG tablet Take 1 tablet (50 mcg total) by mouth daily. 90 tablet 2  . Misc. Devices (NASADOCK) MISC by Does not apply route.    . traMADol (ULTRAM) 50 MG tablet Take 1 tablet (50 mg total) by mouth every 8 (eight) hours as needed. 30 tablet 0  . Wheat Dextrin (BENEFIBER) POWD 2 tsp po daily in liquids 730 g 0   No current facility-administered medications on file prior to visit.    No Known Allergies  Review of Systems  Review of Systems  Constitutional: Positive  for malaise/fatigue. Negative for fever and chills.  HENT: Negative for congestion, hearing loss and nosebleeds.   Eyes: Negative for discharge.  Respiratory: Negative for cough, sputum production, shortness of breath and wheezing.   Cardiovascular: Negative for chest pain, palpitations and leg swelling.  Gastrointestinal: Negative for heartburn, nausea, vomiting, abdominal pain, diarrhea, constipation and blood in stool.  Genitourinary: Negative for dysuria, urgency, frequency and hematuria.  Musculoskeletal: Positive for joint pain. Negative for myalgias, back pain and falls.  Skin: Negative for rash.  Neurological: Negative for dizziness, tremors, sensory change, focal weakness, loss of consciousness, weakness and headaches.  Endo/Heme/Allergies: Negative for polydipsia. Does not bruise/bleed easily.    Psychiatric/Behavioral: Negative for depression and suicidal ideas. The patient is not nervous/anxious and does not have insomnia.     Objective  Pulse 61  Temp(Src) 98.7 F (37.1 C) (Oral)  Ht 5' 8.5" (1.74 m)  Wt 234 lb 8 oz (106.369 kg)  BMI 35.13 kg/m2  SpO2 100%  Physical Exam  Physical Exam  Constitutional: He is oriented to person, place, and time and well-developed, well-nourished, and in no distress. No distress.  HENT:  Head: Normocephalic and atraumatic.  Eyes: Conjunctivae are normal.  Neck: Neck supple. No thyromegaly present.  Cardiovascular: Normal rate, regular rhythm and normal heart sounds.   No murmur heard. Pulmonary/Chest: Effort normal and breath sounds normal. No respiratory distress.  Abdominal: He exhibits no distension and no mass. There is no tenderness.  Musculoskeletal: He exhibits no edema.  Neurological: He is alert and oriented to person, place, and time.  Skin: Skin is warm.  Psychiatric: Memory, affect and judgment normal.    Lab Results  Component Value Date   TSH 3.06 05/15/2015   Lab Results  Component Value Date   WBC 8.4 05/15/2015   HGB 16.6 05/15/2015   HCT 49.4 05/15/2015   MCV 88.0 05/15/2015   PLT 254.0 05/15/2015   Lab Results  Component Value Date   CREATININE 0.88 05/15/2015   BUN 19 05/15/2015   NA 139 05/15/2015   K 4.1 05/15/2015   CL 106 05/15/2015   CO2 24 05/15/2015   Lab Results  Component Value Date   ALT 27 05/15/2015   AST 26 05/15/2015   ALKPHOS 32* 05/15/2015   BILITOT 0.5 05/15/2015   Lab Results  Component Value Date   CHOL 219* 05/15/2015   Lab Results  Component Value Date   HDL 33.50* 05/15/2015   Lab Results  Component Value Date   LDLCALC 98 04/17/2014   Lab Results  Component Value Date   TRIG 261.0* 05/15/2015   Lab Results  Component Value Date   CHOLHDL 7 05/15/2015     Assessment & Plan  Testosterone deficiency Is interested in switching to topical treatments  will allow and monitor  Preventative health care Patient encouraged to maintain heart healthy diet, regular exercise, adequate sleep. Consider daily probiotics. Take medications as prescribed. Annual labs reviewed. Given and reviewed copy of ACP documents from [facility] Secretary of State and encouraged to complete and return  Hypothyroidism On Levothyroxine, continue to monitor  Hip pain Encouraged moist heat and gentle stretching as tolerated. May try NSAIDs and prescription meds as directed and report if symptoms worsen or seek immediate care. Will let us know if he is ready for referral to sports med.  Overweight Encouraged DASH diet, decrease po intake and increase exercise as tolerated. Needs 7-8 hours of sleep nightly. Avoid trans fats, eat small, frequent meals every 4-5 hours  with lean proteins, complex carbs and healthy fats. Minimize simple carbs, GMO foods.

## 2015-05-27 NOTE — Assessment & Plan Note (Signed)
Encouraged DASH diet, decrease po intake and increase exercise as tolerated. Needs 7-8 hours of sleep nightly. Avoid trans fats, eat small, frequent meals every 4-5 hours with lean proteins, complex carbs and healthy fats. Minimize simple carbs, GMO foods. 

## 2015-05-27 NOTE — Assessment & Plan Note (Signed)
Patient encouraged to maintain heart healthy diet, regular exercise, adequate sleep. Consider daily probiotics. Take medications as prescribed. Annual labs reviewed. Given and reviewed copy of ACP documents from [employer] and encouraged to complete and return

## 2015-05-27 NOTE — Assessment & Plan Note (Signed)
Is interested in switching to topical treatments will allow and monitor

## 2015-05-27 NOTE — Assessment & Plan Note (Signed)
On Levothyroxine, continue to monitor 

## 2015-05-27 NOTE — Assessment & Plan Note (Signed)
Encouraged moist heat and gentle stretching as tolerated. May try NSAIDs and prescription meds as directed and report if symptoms worsen or seek immediate care. Will let us know if he is ready for referral to sports med.

## 2015-06-09 LAB — COLOGUARD: Cologuard: NEGATIVE

## 2015-06-10 LAB — COLOGUARD

## 2015-06-15 ENCOUNTER — Other Ambulatory Visit: Payer: Self-pay | Admitting: Family Medicine

## 2015-06-29 ENCOUNTER — Telehealth: Payer: Self-pay | Admitting: Family Medicine

## 2015-06-29 NOTE — Telephone Encounter (Signed)
Caller name:Mrs. Dustin Spears Relation to pt: spouse  Call back [phone] Pharmacy:  Reason for call:  Wife inquiring about stool results

## 2015-06-29 NOTE — Telephone Encounter (Signed)
I called [employer] to get this patients results as form was faxed on 05/15/15.  They stated his result was negative.  They will fax over results once again and will then mail a copy to his home.  Called the patients home informed the patient of his results and that he would get a copy mailed to his home.

## 2015-07-02 ENCOUNTER — Encounter: Payer: Self-pay | Admitting: Family Medicine

## 2015-07-03 NOTE — Telephone Encounter (Signed)
PA resubmitted for further review. Awaiting determination. JG//CMA

## 2015-07-04 ENCOUNTER — Encounter: Payer: Self-pay | Admitting: Family Medicine

## 2015-07-04 NOTE — Progress Notes (Signed)
Any news on the PA for Androgel?

## 2015-07-04 NOTE — Progress Notes (Unsigned)
Patient would like to know if ok for him to star Anrogel. Adroderm Patch not working well for hi because he sweats and they come off. States he has tried them per insurance request.

## 2015-07-04 NOTE — Progress Notes (Unsigned)
Left message on patients answering machine for callback regarding results of Cologuard Test. (Negative)

## 2015-07-05 NOTE — Progress Notes (Signed)
The PA appeal is still processing. I will call insurance today to get an update. JG//CMA

## 2015-07-05 NOTE — Progress Notes (Unsigned)
Called patient regarding PA on Androgel. Advised we are working on approval from Community education officer.

## 2015-07-05 NOTE — Progress Notes (Signed)
Please let patient know we are trying

## 2015-08-17 ENCOUNTER — Other Ambulatory Visit: Payer: Managed Care, Other (non HMO)

## 2015-09-04 ENCOUNTER — Encounter: Payer: Self-pay | Admitting: Family Medicine

## 2015-09-04 ENCOUNTER — Ambulatory Visit (INDEPENDENT_AMBULATORY_CARE_PROVIDER_SITE_OTHER): Payer: Managed Care, Other (non HMO) | Admitting: Family Medicine

## 2015-09-04 VITALS — HR 64 | Temp 98.2°F | Ht 69.0 in | Wt 236.5 lb

## 2015-09-04 DIAGNOSIS — E663 Overweight: Secondary | ICD-10-CM

## 2015-09-04 DIAGNOSIS — E039 Hypothyroidism, unspecified: Secondary | ICD-10-CM | POA: Diagnosis not present

## 2015-09-04 DIAGNOSIS — E291 Testicular hypofunction: Secondary | ICD-10-CM | POA: Diagnosis not present

## 2015-09-04 DIAGNOSIS — M25561 Pain in right knee: Secondary | ICD-10-CM

## 2015-09-04 DIAGNOSIS — E349 Endocrine disorder, unspecified: Secondary | ICD-10-CM

## 2015-09-04 DIAGNOSIS — G4733 Obstructive sleep apnea (adult) (pediatric): Secondary | ICD-10-CM

## 2015-09-04 DIAGNOSIS — E785 Hyperlipidemia, unspecified: Secondary | ICD-10-CM

## 2015-09-04 MED ORDER — DICLOFENAC SODIUM 75 MG PO TBEC: DELAYED_RELEASE_TABLET | ORAL | Status: DC

## 2015-09-04 MED ORDER — METHYLPREDNISOLONE 4 MG PO TABS: ORAL_TABLET | ORAL | Status: DC

## 2015-09-04 NOTE — Progress Notes (Signed)
Pre visit review using our clinic review tool, if applicable. No additional management support is needed unless otherwise documented below in the visit note. 

## 2015-09-04 NOTE — Patient Instructions (Signed)
Sacroiliac Joint Dysfunction Sacroiliac joint dysfunction is a condition that causes inflammation on one or both sides of the sacroiliac (SI) joint. The SI joint connects the lower part of the spine (sacrum) with the two upper portions of the pelvis (ilium). This condition causes deep aching or burning pain in the low back. In some cases, the pain may also spread into one or both buttocks or hips or spread down the legs. CAUSES This condition may be caused by:  Pregnancy. During pregnancy, extra stress is put on the SI joints because the pelvis widens.  Injury, such as:  Car accidents.  Sport-related injuries.  Work-related injuries.  Having one leg that is shorter than the other.  Conditions that affect the joints, such as:  Rheumatoid arthritis.  Gout.  Psoriatic arthritis.  Joint infection (septic arthritis). Sometimes, the cause of SI joint dysfunction is not known. SYMPTOMS Symptoms of this condition include:  Aching or burning pain in the lower back. The pain may also spread to other areas, such as:  Buttocks.  Groin.  Thighs and legs.  Muscle spasms in or around the painful areas.  Increased pain when standing, walking, running, stair climbing, bending, or lifting. DIAGNOSIS Your health care provider will do a physical exam and take your medical history. During the exam, the health care provider may move one or both of your legs to different positions to check for pain. Various tests may be done to help verify the diagnosis, including:  Imaging tests to look for other causes of pain. These may include:  MRI.  CT scan.  Bone scan.  Diagnostic injection. A numbing medicine is injected into the SI joint using a needle. If the pain is temporarily improved or stopped after the injection, this can indicate that SI joint dysfunction is the problem. TREATMENT Treatment may vary depending on the cause and severity of your condition. Treatment options may  include:  Applying ice or heat to the lower back area. This can help to reduce pain and muscle spasms.  Medicines to relieve pain or inflammation or to relax the muscles.  Wearing a back brace (sacroiliac brace) to help support the joint while your back is healing.  Physical therapy to increase muscle strength around the joint and flexibility at the joint. This may also involve learning proper body positions and ways of moving to relieve stress on the joint.  Direct manipulation of the SI joint.  Injections of steroid medicine into the joint in order to reduce pain and swelling.  Radiofrequency ablation to burn away nerves that are carrying pain messages from the joint.  Use of a device that provides electrical stimulation in order to reduce pain at the joint.  Surgery to put in screws and plates that limit or prevent joint motion. This is rare. HOME CARE INSTRUCTIONS  Rest as needed. Limit your activities as directed by your health care provider.  Take medicines only as directed by your health care provider.  If directed, apply ice to the affected area:  Put ice in a plastic bag.  Place a towel between your skin and the bag.  Leave the ice on for 20 minutes, 2-3 times per day.  Use a heating pad or a moist heat pack as directed by your health care provider.  Exercise as directed by your health care provider or physical therapist.  Keep all follow-up visits as directed by your health care provider. This is important. SEEK MEDICAL CARE IF:  Your pain is not controlled   with medicine.  You have a fever.  You have increasingly severe pain. SEEK IMMEDIATE MEDICAL CARE IF:  You have weakness, numbness, or tingling in your legs or feet.  You lose control of your bladder or bowel.   This information is not intended to replace advice given to you by your health care provider. Make sure you discuss any questions you have with your health care provider.   Document Released:  01/02/2009 Document Revised: 02/20/2015 Document Reviewed: 06/13/2014 Elsevier Interactive Patient Education 2016 Elsevier Inc.  

## 2015-09-07 ENCOUNTER — Other Ambulatory Visit (INDEPENDENT_AMBULATORY_CARE_PROVIDER_SITE_OTHER): Payer: Managed Care, Other (non HMO)

## 2015-09-07 DIAGNOSIS — E291 Testicular hypofunction: Secondary | ICD-10-CM

## 2015-09-07 DIAGNOSIS — G4733 Obstructive sleep apnea (adult) (pediatric): Secondary | ICD-10-CM

## 2015-09-07 DIAGNOSIS — E039 Hypothyroidism, unspecified: Secondary | ICD-10-CM

## 2015-09-07 DIAGNOSIS — E349 Endocrine disorder, unspecified: Secondary | ICD-10-CM

## 2015-09-07 DIAGNOSIS — E785 Hyperlipidemia, unspecified: Secondary | ICD-10-CM

## 2015-09-07 LAB — CBC WITH DIFFERENTIAL/PLATELET
BASOS PCT: 0 % (ref 0–1)
Basophils Absolute: 0 10*3/uL (ref 0.0–0.1)
Eosinophils Absolute: 0 10*3/uL (ref 0.0–0.7)
Eosinophils Relative: 0 % (ref 0–5)
HCT: 41.6 % (ref 39.0–52.0)
HEMOGLOBIN: 13.8 g/dL (ref 13.0–17.0)
Lymphocytes Relative: 23 % (ref 12–46)
Lymphs Abs: 2.9 10*3/uL (ref 0.7–4.0)
MCH: 28.7 pg (ref 26.0–34.0)
MCHC: 33.2 g/dL (ref 30.0–36.0)
MCV: 86.5 fL (ref 78.0–100.0)
MONO ABS: 1.4 10*3/uL — AB (ref 0.1–1.0)
MPV: 9.3 fL (ref 8.6–12.4)
Monocytes Relative: 11 % (ref 3–12)
NEUTROS ABS: 8.2 10*3/uL — AB (ref 1.7–7.7)
NEUTROS PCT: 66 % (ref 43–77)
Platelets: 258 10*3/uL (ref 150–400)
RBC: 4.81 MIL/uL (ref 4.22–5.81)
RDW: 13.7 % (ref 11.5–15.5)
WBC: 12.4 10*3/uL — AB (ref 4.0–10.5)

## 2015-09-08 LAB — TSH: TSH: 0.791 u[IU]/mL (ref 0.350–4.500)

## 2015-09-08 LAB — LIPID PANEL
Cholesterol: 242 mg/dL — ABNORMAL HIGH (ref 125–200)
HDL: 41 mg/dL (ref >=40)
LDL CALC: 162 mg/dL — AB (ref <130)
Total CHOL/HDL Ratio: 5.9 Ratio — ABNORMAL HIGH (ref <=5.0)
Triglycerides: 194 mg/dL — ABNORMAL HIGH (ref <150)
VLDL: 39 mg/dL — ABNORMAL HIGH (ref <30)

## 2015-09-08 LAB — COMPREHENSIVE METABOLIC PANEL
ALK PHOS: 35 U/L — AB (ref 40–115)
ALT: 27 U/L (ref 9–46)
AST: 20 U/L (ref 10–35)
Albumin: 4.3 g/dL (ref 3.6–5.1)
BILIRUBIN TOTAL: 0.4 mg/dL (ref 0.2–1.2)
BUN: 26 mg/dL — AB (ref 7–25)
CO2: 23 mmol/L (ref 20–31)
CREATININE: 0.89 mg/dL (ref 0.70–1.33)
Calcium: 9.3 mg/dL (ref 8.6–10.3)
Chloride: 103 mmol/L (ref 98–110)
GLUCOSE: 115 mg/dL — AB (ref 65–99)
Potassium: 3.8 mmol/L (ref 3.5–5.3)
Sodium: 136 mmol/L (ref 135–146)
Total Protein: 7.2 g/dL (ref 6.1–8.1)

## 2015-09-08 LAB — TESTOSTERONE: Testosterone: 191 ng/dL — ABNORMAL LOW (ref 300–890)

## 2015-09-11 ENCOUNTER — Other Ambulatory Visit: Payer: Self-pay | Admitting: Family Medicine

## 2015-09-11 MED ORDER — ROSUVASTATIN CALCIUM 10 MG PO TABS: 10.0000 mg | ORAL_TABLET | Freq: Every day | ORAL | Status: DC

## 2015-09-11 MED ORDER — TESTOSTERONE 40.5 MG/2.5GM (1.62%) TD GEL: TRANSDERMAL | Status: DC

## 2015-09-11 NOTE — Telephone Encounter (Signed)
Faxed in [facility] to [facility] in [place] and generic Crestor per lab result instructions.

## 2015-09-14 ENCOUNTER — Encounter: Payer: Self-pay | Admitting: Family Medicine

## 2015-09-14 NOTE — Assessment & Plan Note (Signed)
Has been using a brace but continues to have right hip pain as well. Encouraged moist heat and gentle stretching as tolerated. May try NSAIDs and prescription meds as directed and report if symptoms worsen or seek immediate care. May use bracing prn

## 2015-09-14 NOTE — Assessment & Plan Note (Signed)
Tolerating statin, encouraged heart healthy diet, avoid trans fats, minimize simple carbs and saturated fats. Increase exercise as tolerated 

## 2015-09-14 NOTE — Assessment & Plan Note (Signed)
Tolerating treatment will continue, he believes it has been helpful

## 2015-09-14 NOTE — Assessment & Plan Note (Signed)
Encouraged DASH diet, decrease po intake and increase exercise as tolerated. Needs 7-8 hours of sleep nightly. Avoid trans fats, eat small, frequent meals every 4-5 hours with lean proteins, complex carbs and healthy fats. Minimize simple carbs, GMO foods. 

## 2015-09-14 NOTE — Assessment & Plan Note (Signed)
On Levothyroxine, continue to monitor 

## 2015-09-14 NOTE — Progress Notes (Signed)
Subjective:    Patient ID: Dustin Spears, male    DOB: 10/31/1965, 49 y.o.   MRN: [id number]  Chief Complaint  Patient presents with  . Follow-up    HPI Patient is in today for follow-up. He continues to struggle with right knee pain and has developed some right hip pain as well. Using some bracing and trying to stay active has been somewhat helpful. No recent falls or injury. He continues to struggle with fatigue but he does believe testosterone has been helpful. Denies CP/palp/SOB/HA/congestion/fevers/GI or GU c/o. Taking meds as prescribed  Past Medical History  Diagnosis Date  . Hyperlipidemia   . Allergy     seasonal  . Thyroid disease   . Overweight(278.02) 02/26/2011  . Knee pain, right   . History of chicken pox   . SNORING 03/12/2010  . SLEEP APNEA, OBSTRUCTIVE 04/24/2010  . ALLERGIC RHINITIS 03/12/2010  . FATIGUE 03/12/2010  . Headache(784.0) 03/12/2010  . Neck pain, acute 03/12/2011  . Testosterone deficiency 10/19/2011  . Tendonitis 05/19/2012  . Sports physical 07/16/2012  . Preventative health care 11/04/2012    Past Surgical History  Procedure Laterality Date  . Bicep tendon in left arm  2000    Family History  Problem Relation Age of Onset  . Hyperlipidemia Mother   . Alcohol abuse Father     smoked  . Arthritis Father   . Lupus Paternal Grandmother   . Arthritis Maternal Grandmother   . Ulcers Maternal Grandmother   . Alzheimer's disease Maternal Grandfather     Social History   Social History  . Marital Status: Married    Spouse Name: N/A  . Number of Children: 1  . Years of Education: N/A   Occupational History  . 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 [employer]    Social History Main Topics  . Smoking status: Never Smoker   . Smokeless tobacco: Never Used  . Alcohol Use: Yes     Comment: occasional  . Drug Use: No  . Sexual Activity: Yes   Other Topics Concern  . Not on file   Social History Narrative   Grew up in [place]   Moved to [place]     Outpatient Prescriptions Prior to Visit  Medication Sig Dispense Refill  . Calcium Citrate-Vitamin D 200-250 MG-UNIT TABS Take 1 capsule by mouth daily. 120 tablet 0  . cetirizine (ZYRTEC) 10 MG tablet Take 10 mg by mouth daily.    . fish oil-omega-3 fatty acids 1000 MG capsule Take 2 g by mouth daily.    Marland Kitchen. glucosamine-chondroitin 500-400 MG tablet Take 1 tablet by mouth daily.      Marland Kitchen. levothyroxine (SYNTHROID, LEVOTHROID) 50 MCG tablet Take 1 tablet (50 mcg total) by mouth daily. 90 tablet 2  . Misc. Devices (NASADOCK) MISC by Does not apply route.    . naproxen (NAPROSYN) 500 MG tablet TAKE 1 TABLET BY MOUTH TWICE A DAY WITH A MEAL AS NEEDED FOR PAIN 60 tablet 0  . traMADol (ULTRAM) 50 MG tablet Take 1 tablet (50 mg total) by mouth every 8 (eight) hours as needed. 30 tablet 0  . Wheat Dextrin (BENEFIBER) POWD 2 tsp po daily in liquids 730 g 0  . diclofenac (VOLTAREN) 75 MG EC tablet TAKE 1 TABLET (75 MG TOTAL) BY MOUTH 2 (TWO) TIMES DAILY. 30 tablet 0  . diclofenac (VOLTAREN) 75 MG EC tablet Take 1 tablet (75 mg total) by mouth 2 (two) times daily. 30 tablet  0  . Testosterone (ANDRODERM) 2 MG/24HR PT24 Place 2 mg onto the skin daily. (Patient not taking: Reported on 09/04/2015) 30 patch 3   No facility-administered medications prior to visit.    No Known Allergies  Review of Systems  Constitutional: Positive for malaise/fatigue. Negative for fever.  HENT: Negative for congestion.   Eyes: Negative for discharge.  Respiratory: Negative for shortness of breath.   Cardiovascular: Negative for chest pain, palpitations and leg swelling.  Gastrointestinal: Negative for nausea and abdominal pain.  Genitourinary: Negative for dysuria.  Musculoskeletal: Negative for falls.  Skin: Negative for rash.  Neurological: Negative for loss of consciousness and headaches.  Endo/Heme/Allergies: Negative for environmental allergies.  Psychiatric/Behavioral: Negative for depression. The patient is  not nervous/anxious.        Objective:    Physical Exam  Constitutional: He is oriented to person, place, and time. He appears well-developed and well-nourished. No distress.  HENT:  Head: Normocephalic and atraumatic.  Nose: Nose normal.  Eyes: Right eye exhibits no discharge. Left eye exhibits no discharge.  Neck: Normal range of motion. Neck supple.  Cardiovascular: Normal rate and regular rhythm.   No murmur heard. Pulmonary/Chest: Effort normal and breath sounds normal.  Abdominal: Soft. Bowel sounds are normal. There is no tenderness.  Musculoskeletal: He exhibits no edema.  Neurological: He is alert and oriented to person, place, and time.  Skin: Skin is warm and dry.  Psychiatric: He has a normal mood and affect.  Nursing note and vitals reviewed.   Pulse 64  Temp(Src) 98.2 F (36.8 C) (Oral)  Ht  (1.753 m)  Wt 236 lb 8 oz (107.276 kg)  BMI 34.91 kg/m2  SpO2 99% Wt Readings from Last 3 Encounters:  09/04/15 236 lb 8 oz (107.276 kg)  05/15/15 234 lb 8 oz (106.369 kg)  01/30/15 233 lb (105.688 kg)     Lab Results  Component Value Date   WBC 12.4* 09/07/2015   HGB 13.8 09/07/2015   HCT 41.6 09/07/2015   PLT 258 09/07/2015   GLUCOSE 115* 09/07/2015   CHOL 242* 09/07/2015   TRIG 194* 09/07/2015   HDL 41 09/07/2015   LDLDIRECT 147.0 05/15/2015   LDLCALC 162* 09/07/2015   ALT 27 09/07/2015   AST 20 09/07/2015   NA 136 09/07/2015   K 3.8 09/07/2015   CL 103 09/07/2015   CREATININE 0.89 09/07/2015   BUN 26* 09/07/2015   CO2 23 09/07/2015   TSH 0.791 09/07/2015   PSA 0.64 05/16/2015    Lab Results  Component Value Date   TSH 0.791 09/07/2015   Lab Results  Component Value Date   WBC 12.4* 09/07/2015   HGB 13.8 09/07/2015   HCT 41.6 09/07/2015   MCV 86.5 09/07/2015   PLT 258 09/07/2015   Lab Results  Component Value Date   NA 136 09/07/2015   K 3.8 09/07/2015   CO2 23 09/07/2015   GLUCOSE 115* 09/07/2015   BUN 26* 09/07/2015    CREATININE 0.89 09/07/2015   BILITOT 0.4 09/07/2015   ALKPHOS 35* 09/07/2015   AST 20 09/07/2015   ALT 27 09/07/2015   PROT 7.2 09/07/2015   ALBUMIN 4.3 09/07/2015   CALCIUM 9.3 09/07/2015   GFR 97.33 05/15/2015   Lab Results  Component Value Date   CHOL 242* 09/07/2015   Lab Results  Component Value Date   HDL 41 09/07/2015   Lab Results  Component Value Date   LDLCALC 162* 09/07/2015   Lab Results  Component  Value Date   TRIG 194* 09/07/2015   Lab Results  Component Value Date   CHOLHDL 5.9* 09/07/2015   No results found for: HGBA1C     Assessment & Plan:   Problem List Items Addressed This Visit    Hyperlipidemia    Tolerating statin, encouraged heart healthy diet, avoid trans fats, minimize simple carbs and saturated fats. Increase exercise as tolerated      Relevant Orders   TSH   CBC   Lipid panel   Testosterone   Comprehensive metabolic panel   Hypothyroidism    On Levothyroxine, continue to monitor      Relevant Orders   TSH   CBC   Lipid panel   Testosterone   Comprehensive metabolic panel   Knee pain, right    Has been using a brace but continues to have right hip pain as well. Encouraged moist heat and gentle stretching as tolerated. May try NSAIDs and prescription meds as directed and report if symptoms worsen or seek immediate care. May use bracing prn      Obstructive sleep apnea   Relevant Orders   TSH   CBC   Lipid panel   Testosterone   Comprehensive metabolic panel   Overweight    Encouraged DASH diet, decrease po intake and increase exercise as tolerated. Needs 7-8 hours of sleep nightly. Avoid trans fats, eat small, frequent meals every 4-5 hours with lean proteins, complex carbs and healthy fats. Minimize simple carbs, GMO foods.      Testosterone deficiency - Primary    Tolerating treatment will continue, he believes it has been helpful      Relevant Orders   TSH   CBC   Lipid panel   Testosterone   Comprehensive  metabolic panel      I have discontinued Mr. Polyakov's Testosterone. I am also having him start on methylPREDNISolone. Additionally, I am having him maintain his glucosamine-chondroitin, BENEFIBER, Calcium Citrate-Vitamin D, fish oil-omega-3 fatty acids, NASADOCK, cetirizine, traMADol, levothyroxine, naproxen, and diclofenac.  Meds ordered this encounter  Medications  . DISCONTD: ANDROGEL PUMP 20.25 MG/ACT (1.62%) GEL    Sig: Apply 20.25 mg topically daily.    Refill:  3  . diclofenac (VOLTAREN) 75 MG EC tablet    Sig: TAKE 1 TABLET (75 MG TOTAL) BY MOUTH 2 (TWO) TIMES DAILY.    Dispense:  30 tablet    Refill:  6  . methylPREDNISolone (MEDROL) 4 MG tablet    Sig: 6 tabs po x 1 d then 5 tabs po x 1 d then 4 tabs po x 1 d then 3 tabs po x 1 d then 2 tabs po x 1 d then 1 tab po x 1d then stop    Dispense:  21 tablet    Refill:  0     [name], MD

## 2015-10-08 ENCOUNTER — Other Ambulatory Visit: Payer: Self-pay | Admitting: Medical

## 2015-10-09 ENCOUNTER — Other Ambulatory Visit: Payer: Self-pay | Admitting: Family Medicine

## 2015-10-09 NOTE — Telephone Encounter (Signed)
Requesting:  Tramadol Contract  None UDS   None Last OV    09/04/2015 Last Refill    #30 with 0 refills on 01/18/2015  Please Advise

## 2015-10-09 NOTE — Telephone Encounter (Signed)
Notified Dr. Abner GreenspanBlyth about rx request

## 2015-10-09 NOTE — Telephone Encounter (Signed)
Faxed hardcopy for Tramad[facility] to [facility] [place] 

## 2015-10-09 NOTE — Telephone Encounter (Signed)
I got request to refill patient tramadol I have never seen pt before. I think Clydie BraunKaren accidentally sent request to me.

## 2015-10-19 ENCOUNTER — Other Ambulatory Visit: Payer: Self-pay | Admitting: *Deleted

## 2015-10-19 MED ORDER — ROSUVASTATIN CALCIUM 10 MG PO TABS: 10.0000 mg | ORAL_TABLET | Freq: Every day | ORAL | Status: DC

## 2015-10-19 NOTE — Telephone Encounter (Signed)
Received refill request on the Crestor 10mg .  Pharmacy stated that they never received the refill on the (09/11/15).   Rx sent to the pharmacy by e-script.//AB/CMA

## 2015-10-24 ENCOUNTER — Encounter: Payer: Self-pay | Admitting: Family Medicine

## 2015-10-25 ENCOUNTER — Telehealth: Payer: Self-pay | Admitting: Family Medicine

## 2015-10-25 ENCOUNTER — Ambulatory Visit (INDEPENDENT_AMBULATORY_CARE_PROVIDER_SITE_OTHER): Payer: Managed Care, Other (non HMO) | Admitting: Family Medicine

## 2015-10-25 ENCOUNTER — Encounter: Payer: Self-pay | Admitting: Family Medicine

## 2015-10-25 VITALS — BP 123/83 | HR 58 | Temp 98.2°F | Wt 234.2 lb

## 2015-10-25 DIAGNOSIS — H833X1 Noise effects on right inner ear: Secondary | ICD-10-CM | POA: Diagnosis not present

## 2015-10-25 MED ORDER — FLUTICASONE PROPIONATE 50 MCG/ACT NA SUSP: 2.0000 | Freq: Every day | NASAL | Status: DC

## 2015-10-25 NOTE — Patient Instructions (Signed)
Hearing Loss °Hearing loss is a partial or total loss of the ability to hear. This can be temporary or permanent, and it can happen in one or both ears. Hearing loss may be referred to as deafness. °Medical care is necessary to treat hearing loss properly and to prevent the condition from getting worse. Your hearing may partially or completely come back, depending on what caused your hearing loss and how severe it is. In some cases, hearing loss is permanent. °CAUSES °Common causes of hearing loss include:  °· Too much wax in the ear canal.   °· Infection of the ear canal or middle ear.   °· Fluid in the middle ear.   °· Injury to the ear or surrounding area.   °· An object stuck in the ear.   °· Prolonged exposure to loud sounds, such as music.   °Less common causes of hearing loss include:  °· Tumors in the ear.   °· Viral or bacterial infections, such as meningitis.   °· A hole in the eardrum (perforated eardrum). °· Problems with the hearing nerve that sends signals between the brain and the ear. °· Certain medicines.   °SYMPTOMS  °Symptoms of this condition may include: °· Difficulty telling the difference between sounds. °· Difficulty following a conversation when there is background noise. °· Lack of response to sounds in your environment. This may be most noticeable when you do not respond to startling sounds. °· Needing to turn up the volume on the television, radio, etc. °· Ringing in the ears. °· Dizziness. °· Pain in the ears. °DIAGNOSIS °This condition is diagnosed based on a physical exam and a hearing test (audiometry). The audiometry test will be performed by a hearing specialist (audiologist). You may also be referred to an ear, nose, and throat (ENT) specialist (otolaryngologist).  °TREATMENT °Treatment for recent onset of hearing loss may include:  °· Ear wax removal.   °· Being prescribed medicines to prevent infection (antibiotics).   °· Being prescribed medicines to reduce inflammation  (corticosteroids).   °HOME CARE INSTRUCTIONS °· If you were prescribed an antibiotic medicine, take it as told by your health care provider. Do not stop taking the antibiotic even if you start to feel better. °· Take over-the-counter and prescription medicines only as told by your health care provider. °· Avoid loud noises.   °· Return to your normal activities as told by your health care provider. Ask your health care provider what activities are safe for you. °· Keep all follow-up visits as told by your health care provider. This is important. °SEEK MEDICAL CARE IF:  °· You feel dizzy.   °· You develop new symptoms.   °· You vomit or feel nauseous.   °· You have a fever.   °SEEK IMMEDIATE MEDICAL CARE IF: °· You develop sudden changes in your vision.   °· You have severe ear pain.   °· You have new or increased weakness. °· You have a severe headache. °  °This information is not intended to replace advice given to you by your health care provider. Make sure you discuss any questions you have with your health care provider. °  °Document Released: 10/06/2005 Document Revised: 06/27/2015 Document Reviewed: 02/21/2015 °Elsevier Interactive Patient Education ©2016 Elsevier Inc. ° °

## 2015-10-25 NOTE — Progress Notes (Signed)
Patient ID: Dustin Spears, male    DOB: 1966-10-18  Age: 50 y.o. MRN: [id number]    Subjective:  Subjective HPI  Dustin Spears presents for c/o hearing loss both ears.  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  HENT: Positive for hearing loss. Negative for ear discharge and ear pain.   Eyes: Negative for pain, redness and visual disturbance.  Respiratory: Negative for cough, chest tightness, shortness of breath and wheezing.   Cardiovascular: Negative for chest pain, palpitations and leg swelling.  Endocrine: Negative for cold intolerance, heat intolerance, polydipsia, polyphagia and polyuria.  Genitourinary: Negative for dysuria, frequency and difficulty urinating.  Neurological: Negative for dizziness, light-headedness, numbness and headaches.    History Past Medical History  Diagnosis Date  . Hyperlipidemia   . Allergy     seasonal  . Thyroid disease   . Overweight(278.02) 02/26/2011  . Knee pain, right   . History of chicken pox   . SNORING 03/12/2010  . SLEEP APNEA, OBSTRUCTIVE 04/24/2010  . ALLERGIC RHINITIS 03/12/2010  . FATIGUE 03/12/2010  . Headache(784.0) 03/12/2010  . Neck pain, acute 03/12/2011  . Testosterone deficiency 10/19/2011  . Tendonitis 05/19/2012  . Sports physical 07/16/2012  . Preventative health care 11/04/2012    He has past surgical history that includes bicep tendon in left arm (2000).   His family history includes Alcohol abuse in his father; Alzheimer's disease in his maternal grandfather; Arthritis in his father and maternal grandmother; Hyperlipidemia in his mother; Lupus in his paternal grandmother; Ulcers in his maternal grandmother.He reports that he has never smoked. He has never used smokeless tobacco. He reports that he drinks alcohol. He reports that he does not use illicit drugs.  Current Outpatient Prescriptions on  File Prior to Visit  Medication Sig Dispense Refill  . Calcium Citrate-Vitamin D 200-250 MG-UNIT TABS Take 1 capsule by mouth daily. 120 tablet 0  . cetirizine (ZYRTEC) 10 MG tablet Take 10 mg by mouth daily.    . diclofenac (VOLTAREN) 75 MG EC tablet TAKE 1 TABLET (75 MG TOTAL) BY MOUTH 2 (TWO) TIMES DAILY. 30 tablet 6  . fish oil-omega-3 fatty acids 1000 MG capsule Take 2 g by mouth daily.    Marland Kitchen glucosamine-chondroitin 500-400 MG tablet Take 1 tablet by mouth daily.      Marland Kitchen levothyroxine (SYNTHROID, LEVOTHROID) 50 MCG tablet Take 1 tablet (50 mcg total) by mouth daily. 90 tablet 2  . methylPREDNISolone (MEDROL) 4 MG tablet 6 tabs po x 1 d then 5 tabs po x 1 d then 4 tabs po x 1 d then 3 tabs po x 1 d then 2 tabs po x 1 d then 1 tab po x 1d then stop 21 tablet 0  . Misc. Devices (NASADOCK) MISC by Does not apply route.    . naproxen (NAPROSYN) 500 MG tablet TAKE 1 TABLET BY MOUTH TWICE A DAY WITH A MEAL AS NEEDED FOR PAIN 60 tablet 0  . rosuvastatin (CRESTOR) 10 MG tablet Take 1 tablet (10 mg total) by mouth daily. 30 tablet 3  . Testosterone (ANDROGEL) 40.5 MG/2.5GM (1.62%) GEL Apply to skin 2 pumps daily 2.5 g 1  . traMADol (ULTRAM) 50 MG tablet TAKE 1 TABLET BY MOUTH EVERY 8 HOURS AS NEEDED 30 tablet 0  . Wheat Dextrin (BENEFIBER) POWD 2 tsp po daily in liquids 730 g 0   No current  facility-administered medications on file prior to visit.     Objective:  Objective Physical Exam  Constitutional: He is oriented to person, place, and time. Vital signs are normal. He appears well-developed and well-nourished. He is sleeping.  HENT:  Head: Normocephalic and atraumatic.  Right Ear: Tympanic membrane is not injected, not perforated, not erythematous, not retracted and not bulging. Tympanic membrane mobility is normal. Decreased hearing is noted.  Left Ear: Tympanic membrane is not injected, not perforated, not erythematous, not retracted and not bulging. Tympanic membrane mobility is normal. No  decreased hearing is noted.  Mouth/Throat: Oropharynx is clear and moist.  Eyes: EOM are normal. Pupils are equal, round, and reactive to light.  Neck: Normal range of motion. Neck supple. No thyromegaly present.  Cardiovascular: Normal rate and regular rhythm.   No murmur heard. Pulmonary/Chest: Effort normal and breath sounds normal. No respiratory distress. He has no wheezes. He has no rales. He exhibits no tenderness.  Musculoskeletal: He exhibits no edema or tenderness.  Neurological: He is alert and oriented to person, place, and time.  Skin: Skin is warm and dry.  Psychiatric: He has a normal mood and affect. His behavior is normal. Judgment and thought content normal.  Nursing note and vitals reviewed.  BP 123/83 mmHg  Pulse 58  Temp(Src) 98.2 F (36.8 C) (Oral)  Wt 234 lb 3.2 oz (106.232 kg)  SpO2 98% Wt Readings from Last 3 Encounters:  10/25/15 234 lb 3.2 oz (106.232 kg)  09/04/15 236 lb 8 oz (107.276 kg)  05/15/15 234 lb 8 oz (106.369 kg)     Lab Results  Component Value Date   WBC 12.4* 09/07/2015   HGB 13.8 09/07/2015   HCT 41.6 09/07/2015   PLT 258 09/07/2015   GLUCOSE 115* 09/07/2015   CHOL 242* 09/07/2015   TRIG 194* 09/07/2015   HDL 41 09/07/2015   LDLDIRECT 147.0 05/15/2015   LDLCALC 162* 09/07/2015   ALT 27 09/07/2015   AST 20 09/07/2015   NA 136 09/07/2015   K 3.8 09/07/2015   CL 103 09/07/2015   CREATININE 0.89 09/07/2015   BUN 26* 09/07/2015   CO2 23 09/07/2015   TSH 0.791 09/07/2015   PSA 0.64 05/16/2015    Dg Eye Foreign Body  02/07/2015  CLINICAL DATA:  Metal working/exposure; clearance prior to MRI EXAM: ORBITS FOR FOREIGN BODY - 2 VIEW COMPARISON:  None. FINDINGS: No radiopaque foreign body.  Ok to proceed to MRI. IMPRESSION: No radiopaque foreign body.  Ok to proceed to MRI. Electronically Signed   By: Simonne ComeJohn  Watts M.D.   On: 02/07/2015 07:51     Assessment & Plan:  Plan I am having Mr. Jeanella Crazeierce maintain his glucosamine-chondroitin,  BENEFIBER, Calcium Citrate-Vitamin D, fish oil-omega-3 fatty acids, NASADOCK, cetirizine, levothyroxine, naproxen, diclofenac, methylPREDNISolone, Testosterone, traMADol, and rosuvastatin.  No orders of the defined types were placed in this encounter.    Problem List Items Addressed This Visit    None    Visit Diagnoses    Hearing loss d/t noise, right    -  Primary    Relevant Orders    Ambulatory referral to ENT       Follow-up: Return if symptoms worsen or fail to improve.  Loreen FreudYvonne Lowne, DO

## 2015-10-25 NOTE — Telephone Encounter (Signed)
patient aware.   KP

## 2015-10-25 NOTE — Telephone Encounter (Signed)
Relation to ZO:XWRUpt:self Call back number:[phone] Pharmacy: [id number] - [place], [place] - [address] [phone] (Phone) [phone] (Fax)         Reason for call:  Patient requesting flonase patient states it will be cheaper if MD sends prescription

## 2015-10-25 NOTE — Telephone Encounter (Signed)
Please advise if it is ok to send the Flonase.     KP

## 2015-10-25 NOTE — Telephone Encounter (Signed)
yes

## 2015-10-25 NOTE — Progress Notes (Signed)
Pre visit review using our clinic review tool, if applicable. No additional management support is needed unless otherwise documented below in the visit note. 

## 2015-12-11 ENCOUNTER — Other Ambulatory Visit: Payer: Self-pay | Admitting: Family Medicine

## 2015-12-11 MED ORDER — ROSUVASTATIN CALCIUM 10 MG PO TABS: 10.0000 mg | ORAL_TABLET | Freq: Every day | ORAL | Status: DC

## 2015-12-24 ENCOUNTER — Other Ambulatory Visit: Payer: Self-pay | Admitting: Family Medicine

## 2016-01-02 ENCOUNTER — Other Ambulatory Visit: Payer: Self-pay | Admitting: Family Medicine

## 2016-01-03 ENCOUNTER — Ambulatory Visit (INDEPENDENT_AMBULATORY_CARE_PROVIDER_SITE_OTHER): Payer: Managed Care, Other (non HMO) | Admitting: Family Medicine

## 2016-01-03 ENCOUNTER — Encounter: Payer: Self-pay | Admitting: Family Medicine

## 2016-01-03 VITALS — BP 114/78 | HR 75 | Temp 98.2°F | Ht 69.0 in | Wt 243.5 lb

## 2016-01-03 DIAGNOSIS — E663 Overweight: Secondary | ICD-10-CM | POA: Diagnosis not present

## 2016-01-03 DIAGNOSIS — E785 Hyperlipidemia, unspecified: Secondary | ICD-10-CM

## 2016-01-03 DIAGNOSIS — E291 Testicular hypofunction: Secondary | ICD-10-CM

## 2016-01-03 DIAGNOSIS — E349 Endocrine disorder, unspecified: Secondary | ICD-10-CM

## 2016-01-03 DIAGNOSIS — G4733 Obstructive sleep apnea (adult) (pediatric): Secondary | ICD-10-CM

## 2016-01-03 DIAGNOSIS — E039 Hypothyroidism, unspecified: Secondary | ICD-10-CM

## 2016-01-03 NOTE — Assessment & Plan Note (Signed)
Encouraged DASH diet, decrease po intake and increase exercise as tolerated. Needs 7-8 hours of sleep nightly. Avoid trans fats, eat small, frequent meals every 4-5 hours with lean proteins, complex carbs and healthy fats. Minimize simple carbs, GMO foods. 

## 2016-01-03 NOTE — Assessment & Plan Note (Signed)
Using CPAP doing well 

## 2016-01-03 NOTE — Patient Instructions (Addendum)
Curcumin to help with body aches.   If this does not help try doing without the Crestor for one month.  Salonpas with lidocaine patch and aspercreme for low back and hip areas as needed. Cholesterol Cholesterol is a white, waxy, fat-like substance needed by your body in small amounts. The liver makes all the cholesterol you need. Cholesterol is carried from the liver by the blood through the blood vessels. Deposits of cholesterol (plaque) may build up on blood vessel walls. These make the arteries narrower and stiffer. Cholesterol plaques increase the risk for heart attack and stroke.  You cannot feel your cholesterol level even if it is very high. The only way to know it is high is with a blood test. Once you know your cholesterol levels, you should keep a record of the test results. Work with your health care provider to keep your levels in the desired range.  WHAT DO THE RESULTS MEAN?  Total cholesterol is a rough measure of all the cholesterol in your blood.   LDL is the so-called bad cholesterol. This is the type that deposits cholesterol in the walls of the arteries. You want this level to be low.   HDL is the good cholesterol because it cleans the arteries and carries the LDL away. You want this level to be high.  Triglycerides are fat that the body can either burn for energy or store. High levels are closely linked to heart disease.  WHAT ARE THE DESIRED LEVELS OF CHOLESTEROL?  Total cholesterol below 200.   LDL below 100 for people at risk, below 70 for those at very high risk.   HDL above 50 is good, above 60 is best.   Triglycerides below 150.  HOW CAN I LOWER MY CHOLESTEROL?  Diet. Follow your diet programs as directed by your health care provider.   Choose fish or white meat chicken and [place], roasted or baked. Limit fatty cuts of red meat, fried foods, and processed meats, such as sausage and lunch meats.   Eat lots of fresh fruits and vegetables.  Choose whole  grains, beans, pasta, potatoes, and cereals.   Use only small amounts of olive, corn, or canola oils.   Avoid butter, mayonnaise, shortening, or palm kernel oils.  Avoid foods with trans fats.   Drink skim or nonfat milk and eat low-fat or nonfat yogurt and cheeses. Avoid whole milk, cream, ice cream, egg yolks, and full-fat cheeses.   Healthy desserts include angel food cake, ginger snaps, animal crackers, hard candy, popsicles, and low-fat or nonfat frozen yogurt. Avoid pastries, cakes, pies, and cookies.   Exercise. Follow your exercise programs as directed by your health care provider.   A regular program helps decrease LDL and raise HDL.   A regular program helps with weight control.   Do things that increase your activity level like gardening, walking, or taking the stairs. Ask your health care provider about how you can be more active in your daily life.   Medicine. Take medicine only as directed by your health care provider.   Medicine may be prescribed by your health care provider to help lower cholesterol and decrease the risk for heart disease.   If you have several risk factors, you may need medicine even if your levels are normal.   This information is not intended to replace advice given to you by your health care provider. Make sure you discuss any questions you have with your health care provider.   Document Released: 07/01/2001  Document Revised: 10/27/2014 Document Reviewed: 07/20/2013 Elsevier Interactive Patient Education [employer]

## 2016-01-03 NOTE — Assessment & Plan Note (Signed)
Declines check level with next visit. Says he does not feel any worse off of ,ed

## 2016-01-03 NOTE — Assessment & Plan Note (Signed)
He feels he is tolerating his Crestor but he has had increased myalgias for roughly a month. Encouraged to increase exercise, eat clean foods, get plenty of sleep and add curcumen and topical treatments.

## 2016-01-03 NOTE — Progress Notes (Signed)
Pre visit review using our clinic review tool, if applicable. No additional management support is needed unless otherwise documented below in the visit note. 

## 2016-01-03 NOTE — Telephone Encounter (Signed)
[facility] [place]  

## 2016-01-03 NOTE — Assessment & Plan Note (Signed)
On Levothyroxine, continue to monitor 

## 2016-01-03 NOTE — Progress Notes (Signed)
Subjective:    Patient ID: Dustin PattenBrian Aydelott, male    DOB: [birth year], 50 y.o.   MRN: [id number]  Chief Complaint  Patient presents with  . Follow-up    HPI Patient is in today for follow up with some concerns with body aches, patient had labs done recently at work and cholesterol is better.  States he does not think the testosterone makes any difference and he has not been taking it also states painful and stiffness on the hip and back more while bending at work.   Denies CP/palp/SOB/HA/congestion/fevers/GI or GU c/o. Taking meds as prescribed   Past Medical History  Diagnosis Date  . Hyperlipidemia   . Allergy     seasonal  . Thyroid disease   . Overweight(278.02) 02/26/2011  . Knee pain, right   . History of chicken pox   . SNORING 03/12/2010  . SLEEP APNEA, OBSTRUCTIVE 04/24/2010  . ALLERGIC RHINITIS 03/12/2010  . FATIGUE 03/12/2010  . Headache(784.0) 03/12/2010  . Neck pain, acute 03/12/2011  . Testosterone deficiency 10/19/2011  . Tendonitis 05/19/2012  . Sports physical 07/16/2012  . Preventative health care 11/04/2012    Past Surgical History  Procedure Laterality Date  . Bicep tendon in left arm  2000    Family History  Problem Relation Age of Onset  . Hyperlipidemia Mother   . Alcohol abuse Father     smoked  . Arthritis Father   . Lupus Paternal Grandmother   . Arthritis Maternal Grandmother   . Ulcers Maternal Grandmother   . Alzheimer's disease Maternal Grandfather     Social History   Social History  . Marital Status: Married    Spouse Name: N/A  . Number of Children: 1  . Years of Education: N/A   Occupational History  . 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 [employer]    Social History Main Topics  . Smoking status: Never Smoker   . Smokeless tobacco: Never Used  . Alcohol Use: Yes     Comment: occasional  . Drug Use: No  . Sexual Activity: Yes   Other Topics Concern  . Not on file   Social History Narrative   Grew up in [place]   Moved to [place] 1990     Outpatient Prescriptions Prior to Visit  Medication Sig Dispense Refill  . Calcium Citrate-Vitamin D 200-250 MG-UNIT TABS Take 1 capsule by mouth daily. 120 tablet 0  . diclofenac (VOLTAREN) 75 MG EC tablet TAKE 1 TABLET (75 MG TOTAL) BY MOUTH 2 (TWO) TIMES DAILY. 30 tablet 6  . fish oil-omega-3 fatty acids 1000 MG capsule Take 2 g by mouth daily.    Marland Kitchen. glucosamine-chondroitin 500-400 MG tablet Take 1 tablet by mouth daily.      Marland Kitchen. levothyroxine (SYNTHROID, LEVOTHROID) 50 MCG tablet Take 1 tablet (50 mcg total) by mouth daily. 90 tablet 2  . Misc. Devices (NASADOCK) MISC by Does not apply route.    . rosuvastatin (CRESTOR) 10 MG tablet Take 1 tablet (10 mg total) by mouth daily. 90 tablet 0  . traMADol (ULTRAM) 50 MG tablet TAKE 1 TABLET BY MOUTH EVERY 8 HOURS AS NEEDED 30 tablet 0  . Wheat Dextrin (BENEFIBER) POWD 2 tsp po daily in liquids 730 g 0  . cetirizine (ZYRTEC) 10 MG tablet Take 10 mg by mouth daily. Reported on 01/03/2016    . fluticasone (FLONASE) 50 MCG/ACT nasal spray Place 2 sprays into both nostrils daily. (Patient not taking: Reported on 01/03/2016) 16 g  6  . naproxen (NAPROSYN) 500 MG tablet TAKE 1 TABLET BY MOUTH TWICE A DAY WITH A MEAL AS NEEDED FOR PAIN (Patient not taking: Reported on 01/03/2016) 60 tablet 0  . Testosterone (ANDROGEL) 40.5 MG/2.5GM (1.62%) GEL Apply to skin 2 pumps daily (Patient not taking: Reported on 01/03/2016) 2.5 g 1  . levothyroxine (SYNTHROID, LEVOTHROID) 50 MCG tablet TAKE 1 TABLET BY MOUTH EVERY DAY 90 tablet 2  . methylPREDNISolone (MEDROL) 4 MG tablet 6 tabs po x 1 d then 5 tabs po x 1 d then 4 tabs po x 1 d then 3 tabs po x 1 d then 2 tabs po x 1 d then 1 tab po x 1d then stop 21 tablet 0   No facility-administered medications prior to visit.    No Known Allergies  Review of Systems  Constitutional: Negative for fever and malaise/fatigue.  HENT: Negative for congestion.   Eyes: Negative for blurred vision.  Respiratory: Negative for  shortness of breath.   Cardiovascular: Negative for chest pain, palpitations and leg swelling.  Gastrointestinal: Negative for nausea, abdominal pain and blood in stool.  Genitourinary: Negative for dysuria and frequency.  Musculoskeletal: Negative for falls.       Body aches     Skin: Negative for rash.  Neurological: Negative for dizziness, loss of consciousness and headaches.  Endo/Heme/Allergies: Negative for environmental allergies.  Psychiatric/Behavioral: Negative for depression. The patient is not nervous/anxious.        Objective:    Physical Exam  Constitutional: He is oriented to person, place, and time. He appears well-developed and well-nourished. No distress.  HENT:  Head: Normocephalic and atraumatic.  Eyes: Conjunctivae are normal.  Neck: Neck supple. No thyromegaly present.  Cardiovascular: Normal rate, regular rhythm and normal heart sounds.   No murmur heard. Pulmonary/Chest: Effort normal and breath sounds normal. No respiratory distress. He has no wheezes.  Abdominal: Soft. Bowel sounds are normal. He exhibits no mass. There is no tenderness.  Musculoskeletal: He exhibits no edema.  Lymphadenopathy:    He has no cervical adenopathy.  Neurological: He is alert and oriented to person, place, and time.  Skin: Skin is warm and dry.  Psychiatric: He has a normal mood and affect. His behavior is normal.    BP 114/78 mmHg  Pulse 75  Temp(Src) 98.2 F (36.8 C) (Oral)  Ht  (1.753 m)  Wt 243 lb 8 oz (110.451 kg)  BMI 35.94 kg/m2  SpO2 97% Wt Readings from Last 3 Encounters:  01/03/16 243 lb 8 oz (110.451 kg)  10/25/15 234 lb 3.2 oz (106.232 kg)  09/04/15 236 lb 8 oz (107.276 kg)     Lab Results  Component Value Date   WBC 12.4* 09/07/2015   HGB 13.8 09/07/2015   HCT 41.6 09/07/2015   PLT 258 09/07/2015   GLUCOSE 115* 09/07/2015   CHOL 242* 09/07/2015   TRIG 194* 09/07/2015   HDL 41 09/07/2015   LDLDIRECT 147.0 05/15/2015   LDLCALC 162*  09/07/2015   ALT 27 09/07/2015   AST 20 09/07/2015   NA 136 09/07/2015   K 3.8 09/07/2015   CL 103 09/07/2015   CREATININE 0.89 09/07/2015   BUN 26* 09/07/2015   CO2 23 09/07/2015   TSH 0.791 09/07/2015   PSA 0.64 05/16/2015    Lab Results  Component Value Date   TSH 0.791 09/07/2015   Lab Results  Component Value Date   WBC 12.4* 09/07/2015   HGB 13.8 09/07/2015   HCT  41.6 09/07/2015   MCV 86.5 09/07/2015   PLT 258 09/07/2015   Lab Results  Component Value Date   NA 136 09/07/2015   K 3.8 09/07/2015   CO2 23 09/07/2015   GLUCOSE 115* 09/07/2015   BUN 26* 09/07/2015   CREATININE 0.89 09/07/2015   BILITOT 0.4 09/07/2015   ALKPHOS 35* 09/07/2015   AST 20 09/07/2015   ALT 27 09/07/2015   PROT 7.2 09/07/2015   ALBUMIN 4.3 09/07/2015   CALCIUM 9.3 09/07/2015   GFR 97.33 05/15/2015   Lab Results  Component Value Date   CHOL 242* 09/07/2015   Lab Results  Component Value Date   HDL 41 09/07/2015   Lab Results  Component Value Date   LDLCALC 162* 09/07/2015   Lab Results  Component Value Date   TRIG 194* 09/07/2015   Lab Results  Component Value Date   CHOLHDL 5.9* 09/07/2015   No results found for: HGBA1C     Assessment & Plan:   Problem List Items Addressed This Visit    Hyperlipidemia    He feels he is tolerating his Crestor but he has had increased myalgias for roughly a month. Encouraged to increase exercise, eat clean foods, get plenty of sleep and add curcumen and topical treatments.       Hypothyroidism    On Levothyroxine, continue to monitor      Obstructive sleep apnea    Using CPAP doing well      Overweight    Encouraged DASH diet, decrease po intake and increase exercise as tolerated. Needs 7-8 hours of sleep nightly. Avoid trans fats, eat small, frequent meals every 4-5 hours with lean proteins, complex carbs and healthy fats. Minimize simple carbs, GMO foods.      Testosterone deficiency - Primary    Declines check level  with next visit. Says he does not feel any worse off of ,ed         I have discontinued Mr. Norsworthy's methylPREDNISolone. I am also having him maintain his glucosamine-chondroitin, BENEFIBER, Calcium Citrate-Vitamin D, fish oil-omega-3 fatty acids, NASADOCK, cetirizine, levothyroxine, naproxen, diclofenac, Testosterone, fluticasone, rosuvastatin, and traMADol.  No orders of the defined types were placed in this encounter.     [name], MD

## 2016-02-18 ENCOUNTER — Ambulatory Visit (INDEPENDENT_AMBULATORY_CARE_PROVIDER_SITE_OTHER): Payer: Managed Care, Other (non HMO) | Admitting: Family Medicine

## 2016-02-18 ENCOUNTER — Encounter: Payer: Self-pay | Admitting: Family Medicine

## 2016-02-18 VITALS — BP 110/82 | HR 77 | Temp 98.4°F | Ht 69.0 in | Wt 244.2 lb

## 2016-02-18 DIAGNOSIS — R5382 Chronic fatigue, unspecified: Secondary | ICD-10-CM

## 2016-02-18 DIAGNOSIS — E038 Other specified hypothyroidism: Secondary | ICD-10-CM | POA: Diagnosis not present

## 2016-02-18 DIAGNOSIS — E349 Endocrine disorder, unspecified: Secondary | ICD-10-CM

## 2016-02-18 DIAGNOSIS — M25561 Pain in right knee: Secondary | ICD-10-CM | POA: Diagnosis not present

## 2016-02-18 DIAGNOSIS — E785 Hyperlipidemia, unspecified: Secondary | ICD-10-CM

## 2016-02-18 DIAGNOSIS — E663 Overweight: Secondary | ICD-10-CM

## 2016-02-18 DIAGNOSIS — G4733 Obstructive sleep apnea (adult) (pediatric): Secondary | ICD-10-CM | POA: Diagnosis not present

## 2016-02-18 DIAGNOSIS — G8929 Other chronic pain: Secondary | ICD-10-CM

## 2016-02-18 DIAGNOSIS — G44001 Cluster headache syndrome, unspecified, intractable: Secondary | ICD-10-CM

## 2016-02-18 DIAGNOSIS — M2550 Pain in unspecified joint: Secondary | ICD-10-CM

## 2016-02-18 DIAGNOSIS — E291 Testicular hypofunction: Secondary | ICD-10-CM

## 2016-02-18 MED ORDER — TRAMADOL HCL 50 MG PO TABS: 50.0000 mg | ORAL_TABLET | Freq: Three times a day (TID) | ORAL | Status: DC | PRN

## 2016-02-18 NOTE — Patient Instructions (Signed)
Fatigue  Fatigue is feeling tired all of the time, a lack of energy, or a lack of motivation. Occasional or mild fatigue is often a normal response to activity or life in general. However, long-lasting (chronic) or extreme fatigue may indicate an underlying medical condition.  HOME CARE INSTRUCTIONS   Watch your fatigue for any changes. The following actions may help to lessen any discomfort you are feeling:  · Talk to your health care provider about how much sleep you need each night. Try to get the required amount every night.  · Take medicines only as directed by your health care provider.  · Eat a healthy and nutritious diet. Ask your health care provider if you need help changing your diet.  · Drink enough fluid to keep your urine clear or pale yellow.  · Practice ways of relaxing, such as yoga, meditation, massage therapy, or acupuncture.  · Exercise regularly.    · Change situations that cause you stress. Try to keep your work and personal routine reasonable.  · Do not abuse illegal drugs.  · Limit alcohol intake to no more than 1 drink per day for nonpregnant women and 2 drinks per day for men. One drink equals 12 ounces of beer, 5 ounces of wine, or 1½ ounces of hard liquor.  · Take a multivitamin, if directed by your health care provider.  SEEK MEDICAL CARE IF:   · Your fatigue does not get better.  · You have a fever.    · You have unintentional weight loss or gain.  · You have headaches.    · You have difficulty:      Falling asleep.    Sleeping throughout the night.  · You feel angry, guilty, anxious, or sad.     · You are unable to have a bowel movement (constipation).    · You skin is dry.     · Your legs or another part of your body is swollen.    SEEK IMMEDIATE MEDICAL CARE IF:   · You feel confused.    · Your vision is blurry.  · You feel faint or pass out.    · You have a severe headache.    · You have severe abdominal, pelvic, or back pain.    · You have chest pain, shortness of breath, or an  irregular or fast heartbeat.    · You are unable to urinate or you urinate less than normal.    · You develop abnormal bleeding, such as bleeding from the rectum, vagina, nose, lungs, or nipples.  · You vomit blood.     · You have thoughts about harming yourself or committing suicide.    · You are worried that you might harm someone else.       This information is not intended to replace advice given to you by your health care provider. Make sure you discuss any questions you have with your health care provider.     Document Released: 08/03/2007 Document Revised: 10/27/2014 Document Reviewed: 02/07/2014  Elsevier Interactive Patient Education ©2016 Elsevier Inc.

## 2016-02-18 NOTE — Assessment & Plan Note (Signed)
crestor caused significant myalgias improved since stopping, check lipid panel today

## 2016-02-18 NOTE — Assessment & Plan Note (Signed)
On Levothyroxine, continue to monitor 

## 2016-02-18 NOTE — Assessment & Plan Note (Signed)
Significantly worse over past 6 months or so. Check labs and endorsed good quality diet and minimize carbs. Keep getting adequate sleep and report if continues to worsen

## 2016-02-18 NOTE — Assessment & Plan Note (Signed)
Right posterior worse than left has noted worsening lately no fall or trauma

## 2016-02-18 NOTE — Assessment & Plan Note (Addendum)
Check level for symptoms is no longer using Androgel

## 2016-02-18 NOTE — Progress Notes (Signed)
Subjective:    Patient ID: Dustin Spears, male    DOB: [birth year], 50 y.o.   MRN: [id number]  Chief Complaint  Patient presents with  . Fatigue  . Pain    HPI Patient is in today for fatigue and pain.  Patient has been some fatigue and some restless leg syndrome, during the night he is shuffling leg.  Patient also reports some left hand weakness, has been concerned about fatigue that has been going on now for about 6-9 months, has some right sided hip pain. Denies  CP/palp/SOB/HA/congestion/fevers/GI or GU c/o. Taking meds as prescribed.   Past Medical History  Diagnosis Date  . Hyperlipidemia   . Allergy     seasonal  . Thyroid disease   . Overweight(278.02) 02/26/2011  . Knee pain, right   . History of chicken pox   . SNORING 03/12/2010  . SLEEP APNEA, OBSTRUCTIVE 04/24/2010  . ALLERGIC RHINITIS 03/12/2010  . FATIGUE 03/12/2010  . Headache(784.0) 03/12/2010  . Neck pain, acute 03/12/2011  . Testosterone deficiency 10/19/2011  . Tendonitis 05/19/2012  . Sports physical 07/16/2012  . Preventative health care 11/04/2012    Past Surgical History  Procedure Laterality Date  . Bicep tendon in left arm  2000    Family History  Problem Relation Age of Onset  . Hyperlipidemia Mother   . Lupus Mother   . Alcohol abuse Father     smoked  . Arthritis Father   . Lupus Paternal Grandmother   . Arthritis Maternal Grandmother   . Ulcers Maternal Grandmother   . Alzheimer's disease Maternal Grandfather     Social History   Social History  . Marital Status: Married    Spouse Name: N/A  . Number of Children: 1  . Years of Education: N/A   Occupational History  . 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 [employer]    Social History Main Topics  . Smoking status: Never Smoker   . Smokeless tobacco: Never Used  . Alcohol Use: Yes     Comment: occasional  . Drug Use: No  . Sexual Activity: Yes   Other Topics Concern  . Not on file   Social History Narrative   Grew up in [place] to [employer] 1990    Outpatient Prescriptions Prior to Visit  Medication Sig Dispense Refill  . Calcium Citrate-Vitamin D 200-250 MG-UNIT TABS Take 1 capsule by mouth daily. 120 tablet 0  . cetirizine (ZYRTEC) 10 MG tablet Take 10 mg by mouth daily. Reported on 01/03/2016    . diclofenac (VOLTAREN) 75 MG EC tablet TAKE 1 TABLET (75 MG TOTAL) BY MOUTH 2 (TWO) TIMES DAILY. 30 tablet 6  . fish oil-omega-3 fatty acids 1000 MG capsule Take 2 g by mouth daily.    . fluticasone (FLONASE) 50 MCG/ACT nasal spray Place 2 sprays into both nostrils daily. 16 g 6  . glucosamine-chondroitin 500-400 MG tablet Take 1 tablet by mouth daily.      Marland Kitchen levothyroxine (SYNTHROID, LEVOTHROID) 50 MCG tablet Take 1 tablet (50 mcg total) by mouth daily. 90 tablet 2  . Misc. Devices (NASADOCK) MISC by Does not apply route.    . naproxen (NAPROSYN) 500 MG tablet TAKE 1 TABLET BY MOUTH TWICE A DAY WITH A MEAL AS NEEDED FOR PAIN 60 tablet 0  . Wheat Dextrin (BENEFIBER) POWD 2 tsp po daily in liquids 730 g 0  . traMADol (ULTRAM) 50 MG tablet TAKE 1 TABLET BY MOUTH EVERY 8  HOURS AS NEEDED 30 tablet 0  . rosuvastatin (CRESTOR) 10 MG tablet Take 1 tablet (10 mg total) by mouth daily. (Patient not taking: Reported on 02/18/2016) 90 tablet 0  . Testosterone (ANDROGEL) 40.5 MG/2.5GM (1.62%) GEL Apply to skin 2 pumps daily (Patient not taking: Reported on 01/03/2016) 2.5 g 1   No facility-administered medications prior to visit.    No Known Allergies  Review of Systems  Constitutional: Positive for malaise/fatigue. Negative for fever.  HENT: Negative for congestion.   Eyes: Negative for blurred vision.  Respiratory: Negative for shortness of breath.   Cardiovascular: Negative for chest pain, palpitations and leg swelling.  Gastrointestinal: Negative for nausea, abdominal pain and blood in stool.  Genitourinary: Negative for dysuria and frequency.  Musculoskeletal: Positive for back pain and joint pain. Negative for falls.  Skin:  Negative for rash.  Neurological: Positive for weakness. Negative for dizziness, loss of consciousness and headaches.  Endo/Heme/Allergies: Negative for environmental allergies.  Psychiatric/Behavioral: Negative for depression. The patient is not nervous/anxious.        Objective:    Physical Exam  Constitutional: He is oriented to person, place, and time. He appears well-developed and well-nourished. No distress.  HENT:  Head: Normocephalic and atraumatic.  Eyes: Conjunctivae are normal.  Neck: Neck supple. No thyromegaly present.  Cardiovascular: Normal rate, regular rhythm and normal heart sounds.   No murmur heard. Pulmonary/Chest: Effort normal and breath sounds normal. No respiratory distress. He has no wheezes.  Abdominal: Soft. Bowel sounds are normal. He exhibits no mass. There is no tenderness.  Musculoskeletal: He exhibits no edema.  Lymphadenopathy:    He has no cervical adenopathy.  Neurological: He is alert and oriented to person, place, and time.  Skin: Skin is warm and dry.  Psychiatric: He has a normal mood and affect. His behavior is normal.    BP 110/82 mmHg  Pulse 77  Temp(Src) 98.4 F (36.9 C) (Oral)  Ht  (1.753 m)  Wt 244 lb 4 oz (110.791 kg)  BMI 36.05 kg/m2  SpO2 97% Wt Readings from Last 3 Encounters:  02/18/16 244 lb 4 oz (110.791 kg)  01/03/16 243 lb 8 oz (110.451 kg)  10/25/15 234 lb 3.2 oz (106.232 kg)     Lab Results  Component Value Date   WBC 12.4* 09/07/2015   HGB 13.8 09/07/2015   HCT 41.6 09/07/2015   PLT 258 09/07/2015   GLUCOSE 115* 09/07/2015   CHOL 242* 09/07/2015   TRIG 194* 09/07/2015   HDL 41 09/07/2015   LDLDIRECT 147.0 05/15/2015   LDLCALC 162* 09/07/2015   ALT 27 09/07/2015   AST 20 09/07/2015   NA 136 09/07/2015   K 3.8 09/07/2015   CL 103 09/07/2015   CREATININE 0.89 09/07/2015   BUN 26* 09/07/2015   CO2 23 09/07/2015   TSH 0.791 09/07/2015   PSA 0.64 05/16/2015    Lab Results  Component Value Date     TSH 0.791 09/07/2015   Lab Results  Component Value Date   WBC 12.4* 09/07/2015   HGB 13.8 09/07/2015   HCT 41.6 09/07/2015   MCV 86.5 09/07/2015   PLT 258 09/07/2015   Lab Results  Component Value Date   NA 136 09/07/2015   K 3.8 09/07/2015   CO2 23 09/07/2015   GLUCOSE 115* 09/07/2015   BUN 26* 09/07/2015   CREATININE 0.89 09/07/2015   BILITOT 0.4 09/07/2015   ALKPHOS 35* 09/07/2015   AST 20 09/07/2015   ALT 27 09/07/2015  PROT 7.2 09/07/2015   ALBUMIN 4.3 09/07/2015   CALCIUM 9.3 09/07/2015   GFR 97.33 05/15/2015   Lab Results  Component Value Date   CHOL 242* 09/07/2015   Lab Results  Component Value Date   HDL 41 09/07/2015   Lab Results  Component Value Date   LDLCALC 162* 09/07/2015   Lab Results  Component Value Date   TRIG 194* 09/07/2015   Lab Results  Component Value Date   CHOLHDL 5.9* 09/07/2015   No results found for: HGBA1C     Assessment & Plan:   Problem List Items Addressed This Visit    Arthralgia    Foot pain improved with rebuilt orthotics but diffuse stiffness and pain noted. Patient endorses FH of Lupus, check an ANA      Relevant Medications   traMADol (ULTRAM) 50 MG tablet   Other Relevant Orders   Sedimentation rate   Antinuclear Antib (ANA)   CBC   Comprehensive metabolic panel   TSH   Lipid panel   Urinalysis   Hemoglobin A1c   Testos,Total,Free and SHBG (Male)   Fatigue    Significantly worse over past 6 months or so. Check labs and endorsed good quality diet and minimize carbs. Keep getting adequate sleep and report if continues to worsen      Relevant Medications   traMADol (ULTRAM) 50 MG tablet   Other Relevant Orders   Sedimentation rate   Antinuclear Antib (ANA)   CBC   Comprehensive metabolic panel   TSH   Lipid panel   Urinalysis   Hemoglobin A1c   Testos,Total,Free and SHBG (Male)   Headache   Relevant Medications   traMADol (ULTRAM) 50 MG tablet   Other Relevant Orders    Sedimentation rate   Antinuclear Antib (ANA)   CBC   Comprehensive metabolic panel   TSH   Lipid panel   Urinalysis   Hemoglobin A1c   Testos,Total,Free and SHBG (Male)   Hyperlipidemia - Primary    crestor caused significant myalgias improved since stopping, check lipid panel today      Relevant Medications   traMADol (ULTRAM) 50 MG tablet   Other Relevant Orders   Sedimentation rate   Antinuclear Antib (ANA)   CBC   Comprehensive metabolic panel   TSH   Lipid panel   Urinalysis   Hemoglobin A1c   Testos,Total,Free and SHBG (Male)   Hypothyroidism    On Levothyroxine, continue to monitor      Relevant Medications   traMADol (ULTRAM) 50 MG tablet   Other Relevant Orders   Sedimentation rate   Antinuclear Antib (ANA)   CBC   Comprehensive metabolic panel   TSH   Lipid panel   Urinalysis   Hemoglobin A1c   Testos,Total,Free and SHBG (Male)   Knee pain, chronic   Relevant Medications   traMADol (ULTRAM) 50 MG tablet   Other Relevant Orders   Sedimentation rate   Antinuclear Antib (ANA)   CBC   Comprehensive metabolic panel   TSH   Lipid panel   Urinalysis   Hemoglobin A1c   Testos,Total,Free and SHBG (Male)   Obstructive sleep apnea    Using CPAP regularly, patient's wife reports some persistent leg movements while sleeping, consider RLS and meds if no improvement and patient continues to endorse fatigue      Relevant Medications   traMADol (ULTRAM) 50 MG tablet   Other Relevant Orders   Sedimentation rate   Antinuclear Antib (ANA)   CBC  Comprehensive metabolic panel   TSH   Lipid panel   Urinalysis   Hemoglobin A1c   Testos,Total,Free and SHBG (Male)   Overweight    Encouraged DASH diet, decrease po intake and increase exercise as tolerated. Needs 7-8 hours of sleep nightly. Avoid trans fats, eat small, frequent meals every 4-5 hours with lean proteins, complex carbs and healthy fats. Minimize simple carbs, GMO foods.      Relevant  Medications   traMADol (ULTRAM) 50 MG tablet   Other Relevant Orders   Sedimentation rate   Antinuclear Antib (ANA)   CBC   Comprehensive metabolic panel   TSH   Lipid panel   Urinalysis   Hemoglobin A1c   Testos,Total,Free and SHBG (Male)   Testosterone deficiency    Check level for symptoms is no longer using Androgel       Relevant Medications   traMADol (ULTRAM) 50 MG tablet   Other Relevant Orders   Sedimentation rate   Antinuclear Antib (ANA)   CBC   Comprehensive metabolic panel   TSH   Lipid panel   Urinalysis   Hemoglobin A1c   Testos,Total,Free and SHBG (Male)      I have changed Mr. Gilson's traMADol. I am also having him maintain his glucosamine-chondroitin, BENEFIBER, Calcium Citrate-Vitamin D, fish oil-omega-3 fatty acids, NASADOCK, cetirizine, levothyroxine, naproxen, diclofenac, Testosterone, fluticasone, and rosuvastatin.  Meds ordered this encounter  Medications  . traMADol (ULTRAM) 50 MG tablet    Sig: Take 1 tablet (50 mg total) by mouth every 8 (eight) hours as needed.    Dispense:  30 tablet    Refill:  0     [name], MD

## 2016-02-18 NOTE — Assessment & Plan Note (Addendum)
Using CPAP regularly, patient's wife reports some persistent leg movements while sleeping, consider RLS and meds if no improvement and patient continues to endorse fatigue

## 2016-02-18 NOTE — Assessment & Plan Note (Signed)
Foot pain improved with rebuilt orthotics but diffuse stiffness and pain noted. Patient endorses FH of Lupus, check an ANA

## 2016-02-18 NOTE — Progress Notes (Signed)
Pre visit review using our clinic review tool, if applicable. No additional management support is needed unless otherwise documented below in the visit note. 

## 2016-02-18 NOTE — Assessment & Plan Note (Signed)
Encouraged DASH diet, decrease po intake and increase exercise as tolerated. Needs 7-8 hours of sleep nightly. Avoid trans fats, eat small, frequent meals every 4-5 hours with lean proteins, complex carbs and healthy fats. Minimize simple carbs, GMO foods. 

## 2016-02-19 LAB — URINALYSIS
BILIRUBIN URINE: NEGATIVE
HGB URINE DIPSTICK: NEGATIVE
Ketones, ur: NEGATIVE
LEUKOCYTES UA: NEGATIVE
NITRITE: NEGATIVE
Specific Gravity, Urine: 1.02 (ref 1.000–1.030)
Total Protein, Urine: NEGATIVE
UROBILINOGEN UA: 0.2 (ref 0.0–1.0)
Urine Glucose: NEGATIVE
pH: 5.5 (ref 5.0–8.0)

## 2016-02-19 LAB — LIPID PANEL
Cholesterol: 253 mg/dL — ABNORMAL HIGH (ref 0–200)
HDL: 40.2 mg/dL (ref >39.00)
NONHDL: 213.24
Total CHOL/HDL Ratio: 6
Triglycerides: 221 mg/dL — ABNORMAL HIGH (ref 0.0–149.0)
VLDL: 44.2 mg/dL — ABNORMAL HIGH (ref 0.0–40.0)

## 2016-02-19 LAB — CBC
HEMATOCRIT: 41.6 % (ref 39.0–52.0)
HEMOGLOBIN: 14.2 g/dL (ref 13.0–17.0)
MCHC: 34.1 g/dL (ref 30.0–36.0)
MCV: 87.1 fl (ref 78.0–100.0)
PLATELETS: 242 10*3/uL (ref 150.0–400.0)
RBC: 4.77 Mil/uL (ref 4.22–5.81)
RDW: 12.8 % (ref 11.5–15.5)
WBC: 6.5 10*3/uL (ref 4.0–10.5)

## 2016-02-19 LAB — COMPREHENSIVE METABOLIC PANEL
ALBUMIN: 4.6 g/dL (ref 3.5–5.2)
ALT: 33 U/L (ref 0–53)
AST: 22 U/L (ref 0–37)
Alkaline Phosphatase: 36 U/L — ABNORMAL LOW (ref 39–117)
BUN: 19 mg/dL (ref 6–23)
CHLORIDE: 105 meq/L (ref 96–112)
CO2: 24 meq/L (ref 19–32)
CREATININE: 0.82 mg/dL (ref 0.40–1.50)
Calcium: 9.9 mg/dL (ref 8.4–10.5)
GFR: 105.27 mL/min (ref >60.00)
GLUCOSE: 80 mg/dL (ref 70–99)
Potassium: 4.1 mEq/L (ref 3.5–5.1)
SODIUM: 138 meq/L (ref 135–145)
Total Bilirubin: 0.4 mg/dL (ref 0.2–1.2)
Total Protein: 7.7 g/dL (ref 6.0–8.3)

## 2016-02-19 LAB — HEMOGLOBIN A1C: HEMOGLOBIN A1C: 5.9 % (ref 4.6–6.5)

## 2016-02-19 LAB — SEDIMENTATION RATE: Sed Rate: 19 mm/hr (ref 0–22)

## 2016-02-19 LAB — ANA: ANA: NEGATIVE

## 2016-02-19 LAB — TSH: TSH: 2.63 u[IU]/mL (ref 0.35–4.50)

## 2016-02-19 LAB — LDL CHOLESTEROL, DIRECT: Direct LDL: 181 mg/dL

## 2016-02-21 LAB — TESTOS,TOTAL,FREE AND SHBG (FEMALE)
SEX HORMONE BINDING GLOB.: 21 nmol/L (ref 10–50)
TESTOSTERONE,FREE: 36.6 pg/mL (ref 35.0–155.0)
TESTOSTERONE,TOTAL,LC/MS/MS: 186 ng/dL — AB (ref 250–1100)

## 2016-02-22 ENCOUNTER — Other Ambulatory Visit: Payer: Self-pay | Admitting: Family Medicine

## 2016-02-22 MED ORDER — TESTOSTERONE 40.5 MG/2.5GM (1.62%) TD GEL: TRANSDERMAL | Status: DC

## 2016-03-20 ENCOUNTER — Other Ambulatory Visit: Payer: Self-pay | Admitting: Family Medicine

## 2016-03-20 NOTE — Telephone Encounter (Signed)
[facility] [facility] [facility]

## 2016-04-09 ENCOUNTER — Encounter: Payer: Self-pay | Admitting: Family Medicine

## 2016-04-10 ENCOUNTER — Other Ambulatory Visit: Payer: Self-pay | Admitting: Family Medicine

## 2016-04-10 DIAGNOSIS — R7989 Other specified abnormal findings of blood chemistry: Secondary | ICD-10-CM

## 2016-04-21 ENCOUNTER — Other Ambulatory Visit: Payer: Self-pay | Admitting: Family Medicine

## 2016-04-21 MED ORDER — TESTOSTERONE 20.25 MG/ACT (1.62%) TD GEL: TRANSDERMAL | Status: DC

## 2016-05-19 ENCOUNTER — Other Ambulatory Visit: Payer: Self-pay | Admitting: Family Medicine

## 2016-05-20 NOTE — Telephone Encounter (Signed)
[facility] [place]

## 2016-06-19 ENCOUNTER — Other Ambulatory Visit: Payer: Self-pay | Admitting: Family Medicine

## 2016-06-20 NOTE — Telephone Encounter (Signed)
[facility] [place]

## 2016-06-24 ENCOUNTER — Other Ambulatory Visit: Payer: Self-pay | Admitting: Family Medicine

## 2016-06-25 ENCOUNTER — Other Ambulatory Visit (INDEPENDENT_AMBULATORY_CARE_PROVIDER_SITE_OTHER): Payer: Managed Care, Other (non HMO)

## 2016-06-25 DIAGNOSIS — R7989 Other specified abnormal findings of blood chemistry: Secondary | ICD-10-CM

## 2016-06-25 DIAGNOSIS — E291 Testicular hypofunction: Secondary | ICD-10-CM

## 2016-06-25 NOTE — Telephone Encounter (Signed)
Caller name: Arlys JohnBrian Relationship to patient: self Can be reached: [phone] Pharmacy: [facility] [place]  Reason for call: pt coming in today for testosterone labs. He only has 1 dose left of androgel. He is asking if it can be sent in asap. I told him lab results would need reviewed first.

## 2016-06-26 ENCOUNTER — Encounter: Payer: Self-pay | Admitting: Family Medicine

## 2016-06-26 LAB — TESTOSTERONE: Testosterone: 471.53 ng/dL (ref 300.00–890.00)

## 2016-06-27 ENCOUNTER — Other Ambulatory Visit: Payer: Self-pay | Admitting: Family Medicine

## 2016-07-07 ENCOUNTER — Other Ambulatory Visit: Payer: Self-pay | Admitting: Family Medicine

## 2016-07-07 DIAGNOSIS — E349 Endocrine disorder, unspecified: Secondary | ICD-10-CM

## 2016-07-07 DIAGNOSIS — M2550 Pain in unspecified joint: Secondary | ICD-10-CM

## 2016-07-07 DIAGNOSIS — E038 Other specified hypothyroidism: Secondary | ICD-10-CM

## 2016-07-07 DIAGNOSIS — E663 Overweight: Secondary | ICD-10-CM

## 2016-07-07 DIAGNOSIS — M25561 Pain in right knee: Secondary | ICD-10-CM

## 2016-07-07 DIAGNOSIS — G44001 Cluster headache syndrome, unspecified, intractable: Secondary | ICD-10-CM

## 2016-07-07 DIAGNOSIS — R5382 Chronic fatigue, unspecified: Secondary | ICD-10-CM

## 2016-07-07 DIAGNOSIS — G4733 Obstructive sleep apnea (adult) (pediatric): Secondary | ICD-10-CM

## 2016-07-07 DIAGNOSIS — G8929 Other chronic pain: Secondary | ICD-10-CM

## 2016-07-07 DIAGNOSIS — E785 Hyperlipidemia, unspecified: Secondary | ICD-10-CM

## 2016-07-08 NOTE — Telephone Encounter (Signed)
Faxed hardcopy for [facility] to [facility] [place] 

## 2016-07-10 ENCOUNTER — Encounter: Payer: Self-pay | Admitting: Family Medicine

## 2016-07-10 ENCOUNTER — Ambulatory Visit (INDEPENDENT_AMBULATORY_CARE_PROVIDER_SITE_OTHER): Payer: Managed Care, Other (non HMO) | Admitting: Family Medicine

## 2016-07-10 VITALS — BP 132/72 | HR 68 | Temp 98.2°F | Ht 69.0 in | Wt 248.0 lb

## 2016-07-10 DIAGNOSIS — E038 Other specified hypothyroidism: Secondary | ICD-10-CM

## 2016-07-10 DIAGNOSIS — E785 Hyperlipidemia, unspecified: Secondary | ICD-10-CM | POA: Diagnosis not present

## 2016-07-10 DIAGNOSIS — E291 Testicular hypofunction: Secondary | ICD-10-CM | POA: Diagnosis not present

## 2016-07-10 DIAGNOSIS — E349 Endocrine disorder, unspecified: Secondary | ICD-10-CM

## 2016-07-10 DIAGNOSIS — E663 Overweight: Secondary | ICD-10-CM

## 2016-07-10 DIAGNOSIS — R739 Hyperglycemia, unspecified: Secondary | ICD-10-CM | POA: Diagnosis not present

## 2016-07-10 DIAGNOSIS — E782 Mixed hyperlipidemia: Secondary | ICD-10-CM

## 2016-07-10 HISTORY — DX: Hyperglycemia, unspecified: R73.9

## 2016-07-10 LAB — COLOGUARD: Cologuard: NEGATIVE

## 2016-07-10 NOTE — Assessment & Plan Note (Signed)
Level normal on Androgel

## 2016-07-10 NOTE — Patient Instructions (Signed)
Testosterone Testosterone is a hormone made by the male's testicles and by the adrenal glands, which are a pair of glands on top of the kidneys. Starting at puberty, testosterone stimulates the development of secondary sex characteristics. This includes a deeper voice, growth of muscles and body hair, and penis enlargement.  Females also produce testosterone in both the adrenal glands and ovaries. A male's body converts testosterone into estradiol, the main male sex hormone. An abnormal level of testosterone can cause health issues in both males and females. You may have this test if your health care provider suspects that an abnormal testosterone level is causing or contributing to other health problems. In males, symptoms of an abnormal testosterone level include:  Infertility.  Erectile dysfunction.  Delayed puberty or premature puberty. In females, symptoms of an abnormally high testosterone level include:  Infertility.  Polycystic ovarian syndrome (PCOS).  Developing masculine features (virilization). This test requires a blood sample taken from a vein in your arm or hand. The sample for this test is usually collected in the morning. The amount of testosterone in your blood is highest at that time. RESULTS It is your responsibility to obtain your test results. Ask the lab or department performing the test when and how you will get your results. Contact your health care provider to discuss any questions you have about your results.  The result of a blood test for testosterone will be given as a range of values. A testosterone level that is outside the normal range may indicate a health problem. Testosterone is measured in nanograms per deciliter (ng/dL). Range of Normal Values Ranges for normal values may vary among different labs and hospitals. You should always check with your health care provider after having lab work or other tests done to discuss whether your values are considered  within normal limits. Normal levels of total testosterone are as follows:  Male:  7 months to 51 years old: less than 30 ng/dL.  10-13 years old: less than 300 ng/dL.  14-15 years old: 170-540 ng/dL.  16-19 years old: 250-910 ng/dL.  51 years old and over: 280-1,080 ng/dL.  Male:  7 months to 51 years old: less than 30 ng/dL.  10-13 years old: less than 40 ng/dL.  14-15 years old: less than 60 ng/dL.  16-19 years old: less than 70 ng/dL.  51 years old and over: less than 70 ng/dL. Meaning of Results Outside Normal Value Ranges A testosterone level that is too low or too high can indicate a number of health problems. In males:  A high testosterone level can occur if you:  Have certain types of tumors.  Have an overactive thyroid gland (hyperthyroidism).  Use anabolic steroids.  Are starting puberty early (precocious puberty).  Have an inherited disorder that affects the adrenal glands (congenital adrenal hyperplasia).  A low testosterone level can occur if you:  Have certain genetic diseases.  Have had certain viral infections, such as mumps.  Have pituitary disease.  Have had an injury to the testicles.  Are an alcoholic. In females:  A high testosterone level can occur if you have:  Certain types of tumors.  An inherited disorder that affects certain cells in the adrenal glands (congenital adrenocortical hyperplasia).  PCOS.  A low testosterone level does not cause health problems. Discuss the results of your testosterone test with your health care provider. Your health care provider will use the results of this test and other tests to make a diagnosis.   This information   is not intended to replace advice given to you by your health care provider. Make sure you discuss any questions you have with your health care provider.   Document Released: 10/23/2004 Document Revised: 10/27/2014 Document Reviewed: 02/01/2014 Elsevier Interactive Patient  Education 2016 [employer] Inc.  

## 2016-07-10 NOTE — Progress Notes (Signed)
Pre visit review using our clinic review tool, if applicable. No additional management support is needed unless otherwise documented below in the visit note. 

## 2016-07-11 LAB — LIPID PANEL
CHOLESTEROL: 204 mg/dL — AB (ref 0–200)
HDL: 40.3 mg/dL (ref >39.00)
LDL Cholesterol: 131 mg/dL — ABNORMAL HIGH (ref 0–99)
NonHDL: 164.19
TRIGLYCERIDES: 168 mg/dL — AB (ref 0.0–149.0)
Total CHOL/HDL Ratio: 5
VLDL: 33.6 mg/dL (ref 0.0–40.0)

## 2016-07-11 LAB — COMPREHENSIVE METABOLIC PANEL
ALBUMIN: 4.3 g/dL (ref 3.5–5.2)
ALT: 39 U/L (ref 0–53)
AST: 28 U/L (ref 0–37)
Alkaline Phosphatase: 33 U/L — ABNORMAL LOW (ref 39–117)
BILIRUBIN TOTAL: 0.4 mg/dL (ref 0.2–1.2)
BUN: 27 mg/dL — AB (ref 6–23)
CALCIUM: 9.4 mg/dL (ref 8.4–10.5)
CO2: 23 mEq/L (ref 19–32)
CREATININE: 0.92 mg/dL (ref 0.40–1.50)
Chloride: 108 mEq/L (ref 96–112)
GFR: 92.03 mL/min (ref >60.00)
Glucose, Bld: 79 mg/dL (ref 70–99)
Potassium: 4.3 mEq/L (ref 3.5–5.1)
SODIUM: 139 meq/L (ref 135–145)
TOTAL PROTEIN: 7.5 g/dL (ref 6.0–8.3)

## 2016-07-11 LAB — CBC
HCT: 42.7 % (ref 39.0–52.0)
Hemoglobin: 14.7 g/dL (ref 13.0–17.0)
MCHC: 34.3 g/dL (ref 30.0–36.0)
MCV: 85.5 fl (ref 78.0–100.0)
PLATELETS: 241 10*3/uL (ref 150.0–400.0)
RBC: 5 Mil/uL (ref 4.22–5.81)
RDW: 12.8 % (ref 11.5–15.5)
WBC: 8.2 10*3/uL (ref 4.0–10.5)

## 2016-07-11 LAB — TSH: TSH: 2.24 u[IU]/mL (ref 0.35–4.50)

## 2016-07-11 LAB — HEMOGLOBIN A1C: Hgb A1c MFr Bld: 5.9 % (ref 4.6–6.5)

## 2016-07-19 NOTE — Assessment & Plan Note (Signed)
hgba1c acceptable, minimize simple carbs. Increase exercise as tolerated.  

## 2016-07-19 NOTE — Assessment & Plan Note (Signed)
Tolerating statin, encouraged heart healthy diet, avoid trans fats, minimize simple carbs and saturated fats. Increase exercise as tolerated 

## 2016-07-19 NOTE — Progress Notes (Signed)
Patient ID: Dustin Spears, male   DOB: 1966-04-26, 50 y.o.   MRN: [id number]   Subjective:    Patient ID: Dustin Spears, male    DOB: [birth year], 50 y.o.   MRN: [id number]  Chief Complaint  Patient presents with  . Follow-up    HPI Patient is in today for follow up. No recent illness but has continued to struggle with back pain and diffuse pain notably in hips, knees and shoulders. Has been seeing Dr Owens Shark a chiropractor and that has helped his back pain some. Also struggles with fatigue. Denies CP/palp/SOB/HA/congestion/fevers/GI or GU c/o. Taking meds as prescribed  Past Medical History:  Diagnosis Date  . ALLERGIC RHINITIS 03/12/2010  . Allergy    seasonal  . FATIGUE 03/12/2010  . Headache(784.0) 03/12/2010  . History of chicken pox   . Hyperglycemia 07/10/2016  . Hyperlipidemia   . Knee pain, right   . Neck pain, acute 03/12/2011  . Overweight(278.02) 02/26/2011  . Preventative health care 11/04/2012  . SLEEP APNEA, OBSTRUCTIVE 04/24/2010  . SNORING 03/12/2010  . Sports physical 07/16/2012  . Tendonitis 05/19/2012  . Testosterone deficiency 10/19/2011  . Thyroid disease     Past Surgical History:  Procedure Laterality Date  . bicep tendon in left arm  2000    Family History  Problem Relation Age of Onset  . Hyperlipidemia Mother   . Lupus Mother   . Alcohol abuse Father     smoked  . Arthritis Father   . Lupus Paternal Grandmother   . Arthritis Maternal Grandmother   . Ulcers Maternal Grandmother   . Alzheimer's disease Maternal Grandfather     Social History   Social History  . Marital status: Married    Spouse name: N/A  . Number of children: 1  . Years of education: N/A   Occupational History  . 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 [employer]    Social History Main Topics  . Smoking status: Never Smoker  . Smokeless tobacco: Never Used  . Alcohol use Yes     Comment: occasional  . Drug use: No  . Sexual activity: Yes   Other Topics Concern  . Not on file    Social History Narrative   Grew up in [place]    Outpatient Medications Prior to Visit  Medication Sig Dispense Refill  . ANDROGEL PUMP 20.25 MG/ACT (1.62%) GEL APPLY 2 PUMPS TO SKIN ONCE DAILY 75 g 0  . Calcium Citrate-Vitamin D 200-250 MG-UNIT TABS Take 1 capsule by mouth daily. 120 tablet 0  . cetirizine (ZYRTEC) 10 MG tablet Take 10 mg by mouth daily. Reported on 01/03/2016    . diclofenac (VOLTAREN) 75 MG EC tablet TAKE 1 TABLET (75 MG TOTAL) BY MOUTH 2 (TWO) TIMES DAILY. 30 tablet 6  . fish oil-omega-3 fatty acids 1000 MG capsule Take 2 g by mouth daily.    . fluticasone (FLONASE) 50 MCG/ACT nasal spray Place 2 sprays into both nostrils daily. 16 g 6  . glucosamine-chondroitin 500-400 MG tablet Take 1 tablet by mouth daily.      Marland Kitchen levothyroxine (SYNTHROID, LEVOTHROID) 50 MCG tablet Take 1 tablet (50 mcg total) by mouth daily. 90 tablet 2  . Misc. Devices (NASADOCK) MISC by Does not apply route.    . naproxen (NAPROSYN) 500 MG tablet TAKE 1 TABLET BY MOUTH TWICE A DAY WITH A MEAL AS NEEDED FOR PAIN 60 tablet 0  . rosuvastatin (CRESTOR) 10 MG tablet  Take 1 tablet (10 mg total) by mouth daily. 90 tablet 0  . Testosterone (ANDROGEL) 40.5 MG/2.5GM (1.62%) GEL Apply to skin 2 pumps daily 2.5 g 0  . traMADol (ULTRAM) 50 MG tablet TAKE 1 TABLET BY MOUTH EVERY 8 HOURS AS NEEDED 30 tablet 0  . Wheat Dextrin (BENEFIBER) POWD 2 tsp po daily in liquids 730 g 0   No facility-administered medications prior to visit.     No Known Allergies  Review of Systems  Constitutional: Positive for malaise/fatigue. Negative for fever.  HENT: Negative for congestion.   Eyes: Negative for blurred vision.  Respiratory: Negative for shortness of breath.   Cardiovascular: Negative for chest pain, palpitations and leg swelling.  Gastrointestinal: Negative for abdominal pain, blood in stool and nausea.  Genitourinary: Negative for dysuria and frequency.  Musculoskeletal: Positive for back  pain and joint pain. Negative for falls.  Skin: Negative for rash.  Neurological: Negative for dizziness, loss of consciousness and headaches.  Endo/Heme/Allergies: Negative for environmental allergies.  Psychiatric/Behavioral: Negative for depression. The patient is not nervous/anxious.        Objective:    Physical Exam  Constitutional: He is oriented to person, place, and time. He appears well-developed and well-nourished. No distress.  HENT:  Head: Normocephalic and atraumatic.  Nose: Nose normal.  Eyes: Right eye exhibits no discharge. Left eye exhibits no discharge.  Neck: Normal range of motion. Neck supple.  Cardiovascular: Normal rate and regular rhythm.   No murmur heard. Pulmonary/Chest: Effort normal and breath sounds normal.  Abdominal: Soft. Bowel sounds are normal. There is no tenderness.  Musculoskeletal: He exhibits no edema.  Neurological: He is alert and oriented to person, place, and time.  Skin: Skin is warm and dry.  Psychiatric: He has a normal mood and affect.  Nursing note and vitals reviewed.   BP 132/72 (BP Location: Right Arm, Patient Position: Sitting, Cuff Size: Large)   Pulse 68   Temp 98.2 F (36.8 C) (Oral)   Ht 5' 9"  (1.753 m)   Wt 248 lb (112.5 kg)   SpO2 98%   BMI 36.62 kg/m  Wt Readings from Last 3 Encounters:  07/10/16 248 lb (112.5 kg)  02/18/16 244 lb 4 oz (110.8 kg)  01/03/16 243 lb 8 oz (110.5 kg)     Lab Results  Component Value Date   WBC 8.2 07/10/2016   HGB 14.7 07/10/2016   HCT 42.7 07/10/2016   PLT 241.0 07/10/2016   GLUCOSE 79 07/10/2016   CHOL 204 (H) 07/10/2016   TRIG 168.0 (H) 07/10/2016   HDL 40.30 07/10/2016   LDLDIRECT 181.0 02/18/2016   LDLCALC 131 (H) 07/10/2016   ALT 39 07/10/2016   AST 28 07/10/2016   NA 139 07/10/2016   K 4.3 07/10/2016   CL 108 07/10/2016   CREATININE 0.92 07/10/2016   BUN 27 (H) 07/10/2016   CO2 23 07/10/2016   TSH 2.24 07/10/2016   PSA 0.64 05/16/2015   HGBA1C 5.9  07/10/2016    Lab Results  Component Value Date   TSH 2.24 07/10/2016   Lab Results  Component Value Date   WBC 8.2 07/10/2016   HGB 14.7 07/10/2016   HCT 42.7 07/10/2016   MCV 85.5 07/10/2016   PLT 241.0 07/10/2016   Lab Results  Component Value Date   NA 139 07/10/2016   K 4.3 07/10/2016   CO2 23 07/10/2016   GLUCOSE 79 07/10/2016   BUN 27 (H) 07/10/2016   CREATININE 0.92 07/10/2016  BILITOT 0.4 07/10/2016   ALKPHOS 33 (L) 07/10/2016   AST 28 07/10/2016   ALT 39 07/10/2016   PROT 7.5 07/10/2016   ALBUMIN 4.3 07/10/2016   CALCIUM 9.4 07/10/2016   GFR 92.03 07/10/2016   Lab Results  Component Value Date   CHOL 204 (H) 07/10/2016   Lab Results  Component Value Date   HDL 40.30 07/10/2016   Lab Results  Component Value Date   LDLCALC 131 (H) 07/10/2016   Lab Results  Component Value Date   TRIG 168.0 (H) 07/10/2016   Lab Results  Component Value Date   CHOLHDL 5 07/10/2016   Lab Results  Component Value Date   HGBA1C 5.9 07/10/2016       Assessment & Plan:   Problem List Items Addressed This Visit    Hypothyroidism - Primary    On Levothyroxine, continue to monitor      Relevant Orders   TSH   TSH (Completed)   Hyperlipidemia    Tolerating statin, encouraged heart healthy diet, avoid trans fats, minimize simple carbs and saturated fats. Increase exercise as tolerated      Relevant Orders   CBC (Completed)   Comp Met (CMET) (Completed)   Lipid panel (Completed)   Overweight    Encouraged DASH diet, decrease po intake and increase exercise as tolerated. Needs 7-8 hours of sleep nightly. Avoid trans fats, eat small, frequent meals every 4-5 hours with lean proteins, complex carbs and healthy fats. Minimize simple carbs      Testosterone deficiency    Level normal on Androgel      Relevant Orders   Testosterone   Hyperglycemia    hgba1c acceptable, minimize simple carbs. Increase exercise as tolerated.       Relevant Orders   CBC     Hemoglobin A1c   Comprehensive metabolic panel   Hemoglobin A1C (Completed)   CBC (Completed)   Comp Met (CMET) (Completed)    Other Visit Diagnoses    Hyperlipidemia, mixed       Relevant Orders   Lipid panel      I am having Mr. Sayegh maintain his glucosamine-chondroitin, BENEFIBER, Calcium Citrate-Vitamin D, fish oil-omega-3 fatty acids, NASADOCK, cetirizine, levothyroxine, naproxen, diclofenac, fluticasone, rosuvastatin, Testosterone, ANDROGEL PUMP, and traMADol.  No orders of the defined types were placed in this encounter.    Penni Homans, MD

## 2016-07-19 NOTE — Assessment & Plan Note (Signed)
Encouraged DASH diet, decrease po intake and increase exercise as tolerated. Needs 7-8 hours of sleep nightly. Avoid trans fats, eat small, frequent meals every 4-5 hours with lean proteins, complex carbs and healthy fats. Minimize simple carbs 

## 2016-07-19 NOTE — Assessment & Plan Note (Signed)
On Levothyroxine, continue to monitor 

## 2016-07-22 ENCOUNTER — Telehealth: Payer: Self-pay | Admitting: Family Medicine

## 2016-07-22 NOTE — Telephone Encounter (Signed)
[facility] [place]

## 2016-07-28 NOTE — Telephone Encounter (Signed)
Patient states that the pharmacy is saying that they do not have authorization for this medication. Patient states that he tends to have this problem every time with this medication and he is wondering if he could get 4-5 refills on it until her needs to be seen in the office again so he doesn't have to go through this situation every time. Please advise.    Patient relation: self Patient phone: [phone]

## 2016-07-29 MED ORDER — TESTOSTERONE 10 MG/ACT (2%) TD GEL: 40.0000 mg | Freq: Every day | TRANSDERMAL | 3 refills | Status: DC

## 2016-07-29 NOTE — Addendum Note (Signed)
Addended by: Scharlene GlossEWING, Perfecto Purdy B on: 07/29/2016 02:14 PM   Modules accepted: Orders

## 2016-07-29 NOTE — Telephone Encounter (Signed)
PA required or change to preferred alternative---Testosterone  2% gel or Androderm, axiron??

## 2016-07-29 NOTE — Telephone Encounter (Signed)
Called left message to call back 

## 2016-07-29 NOTE — Telephone Encounter (Signed)
Printed testosterone gel and [facility] [place]. Called the patient to inform of change/left message to call back.

## 2016-07-29 NOTE — Telephone Encounter (Signed)
OK to switch to Testosterone 2% gel apply 40 mg daily. Disp 30 day supply with 3 rf

## 2016-07-29 NOTE — Telephone Encounter (Signed)
Patient is returning your call regarding this prescription. Please call patient back.

## 2016-08-01 IMAGING — CR DG HIP (WITH OR WITHOUT PELVIS) 2-3V*R*
4 series · 4 of 4 positions shown · non-contrast
Comparison: None.

CLINICAL DATA: Two months of right hip pain without known injury

EXAM:
RIGHT HIP (WITH PELVIS) 2-3 VIEWS

[wall pelvis]
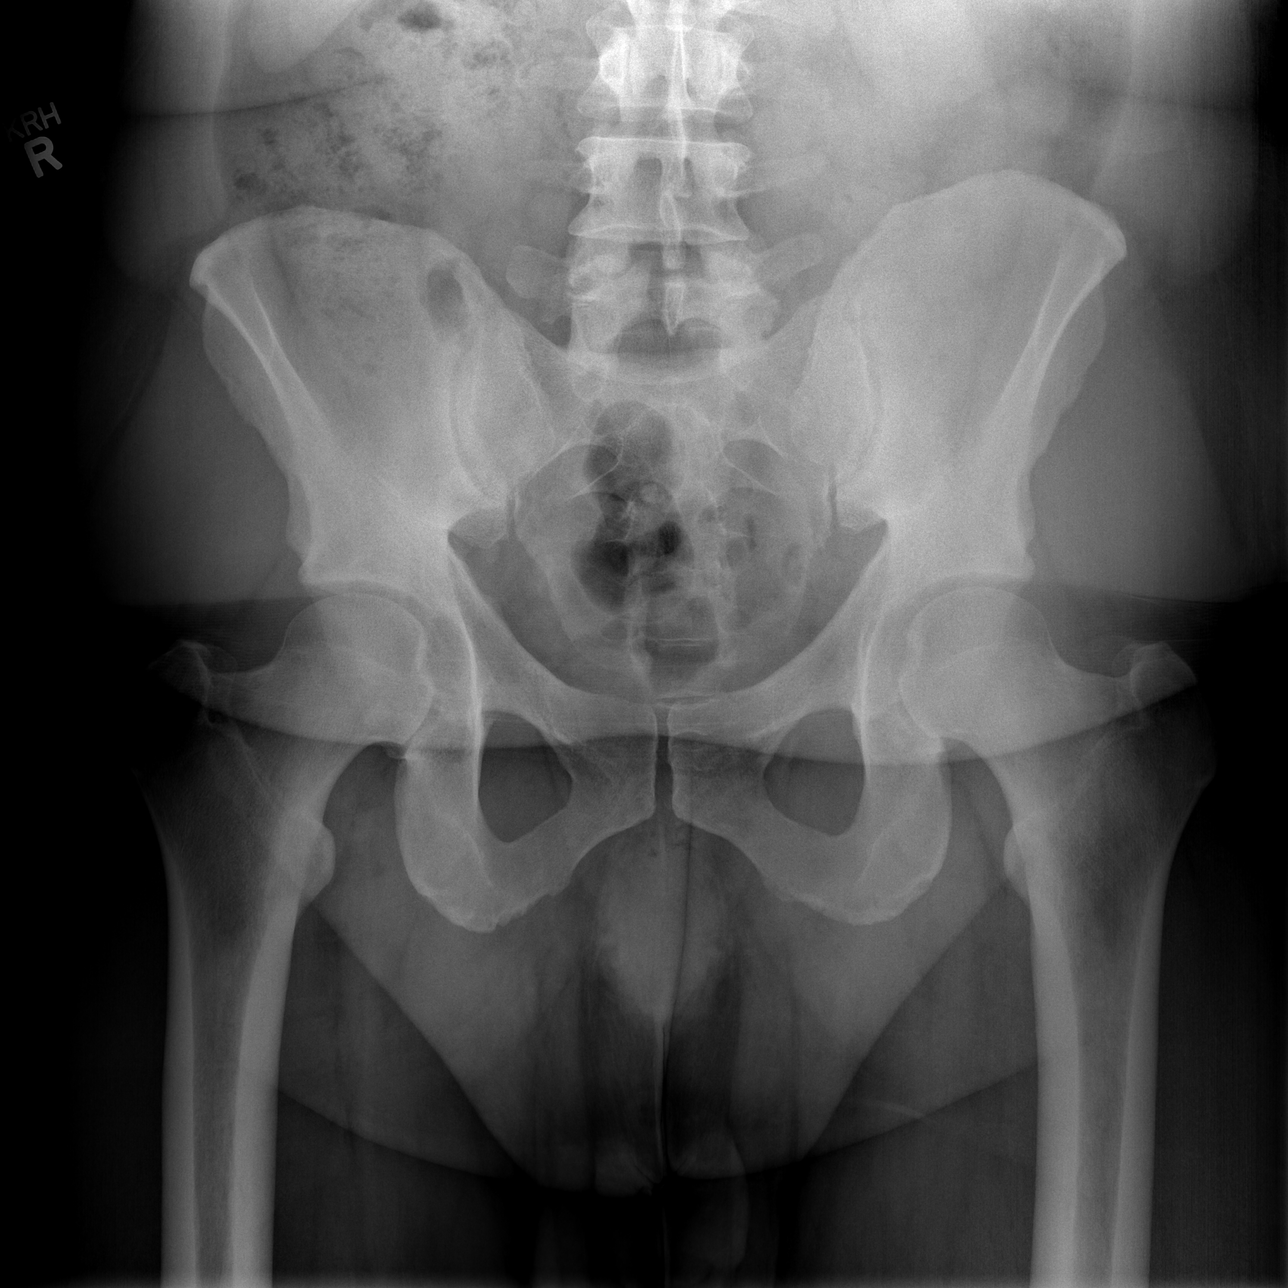

[w cross table hip right (1 of 3)]
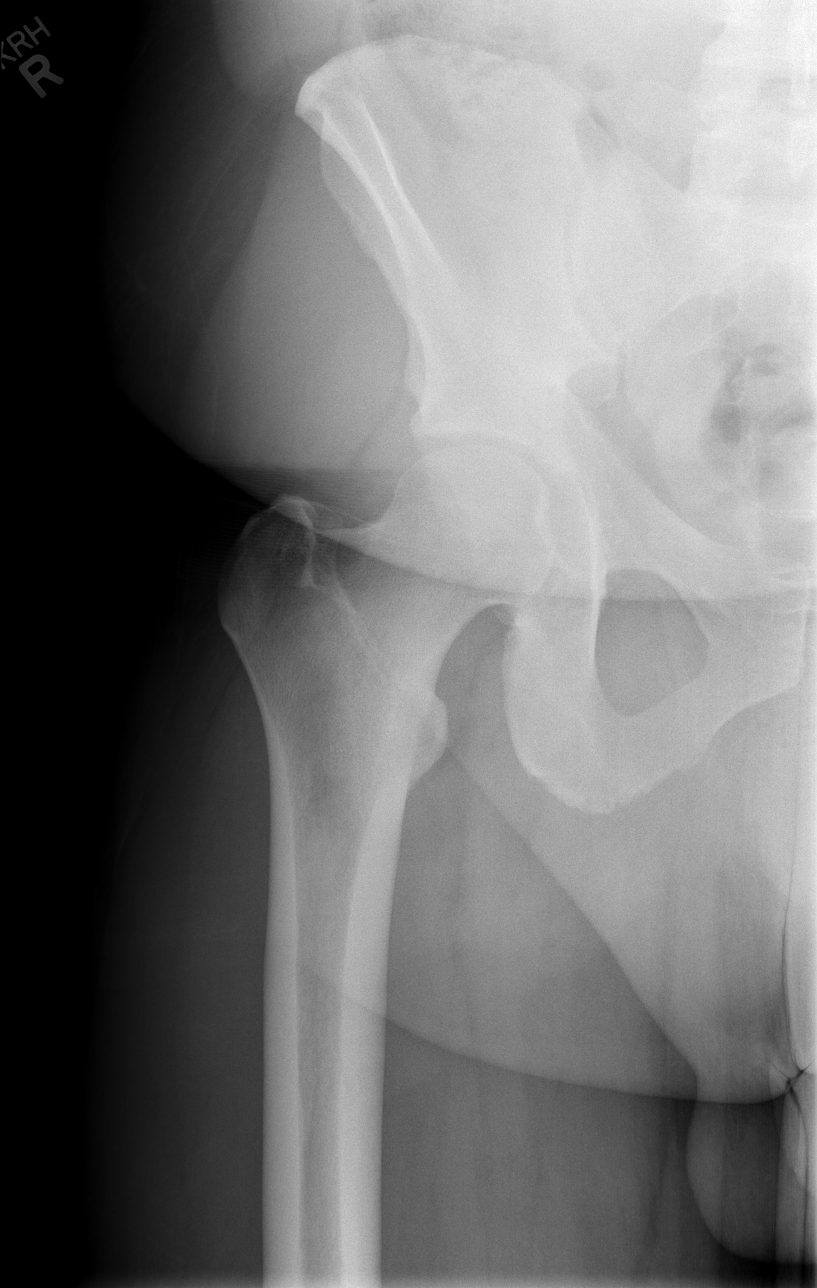

[w cross table hip right (2 of 3)]
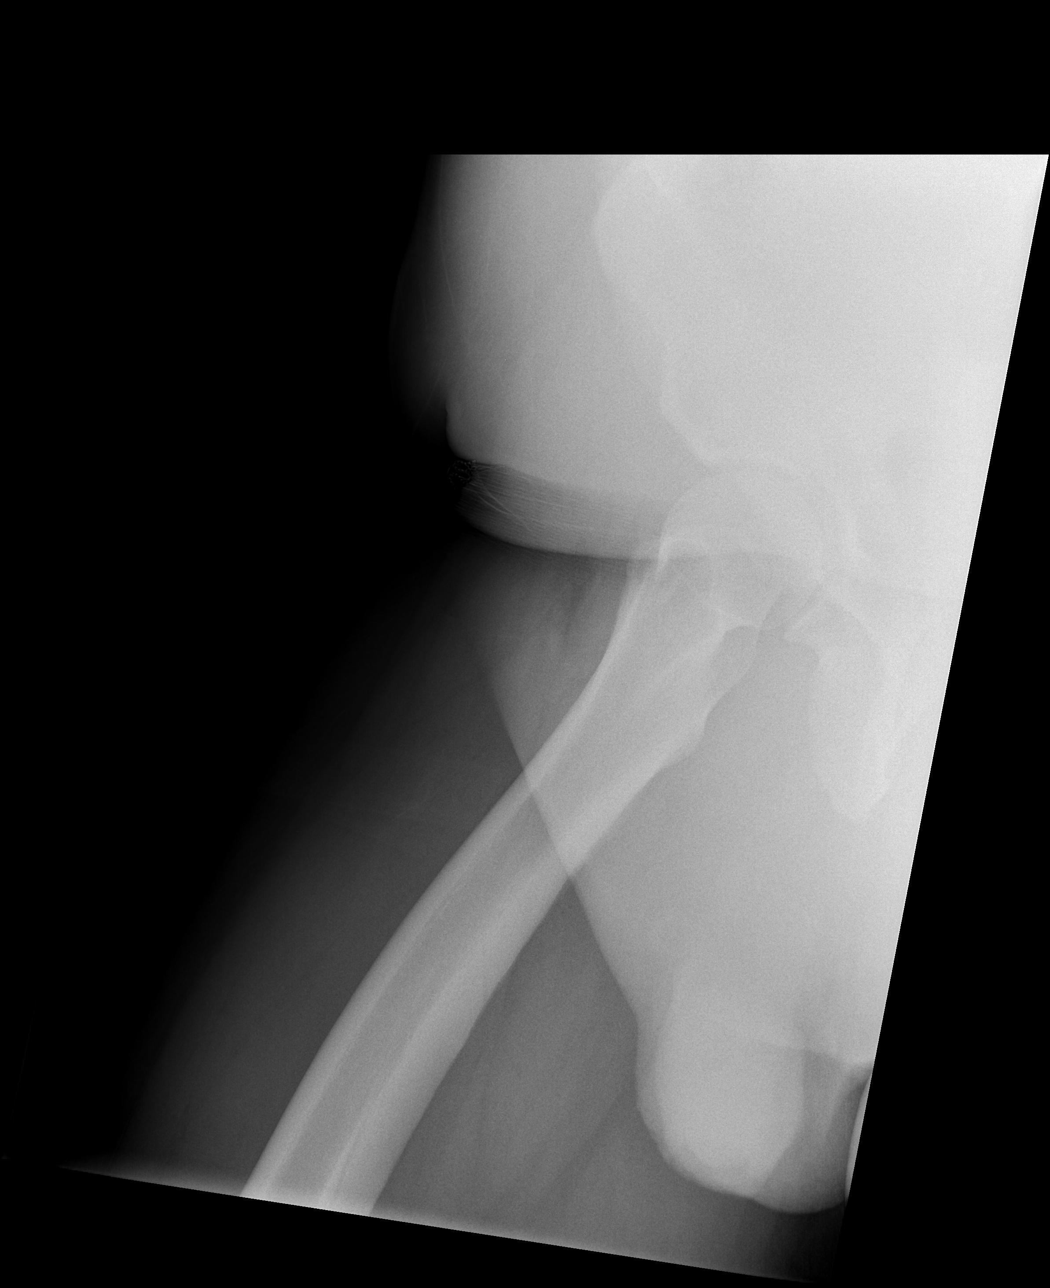

[w cross table hip right (3 of 3)]
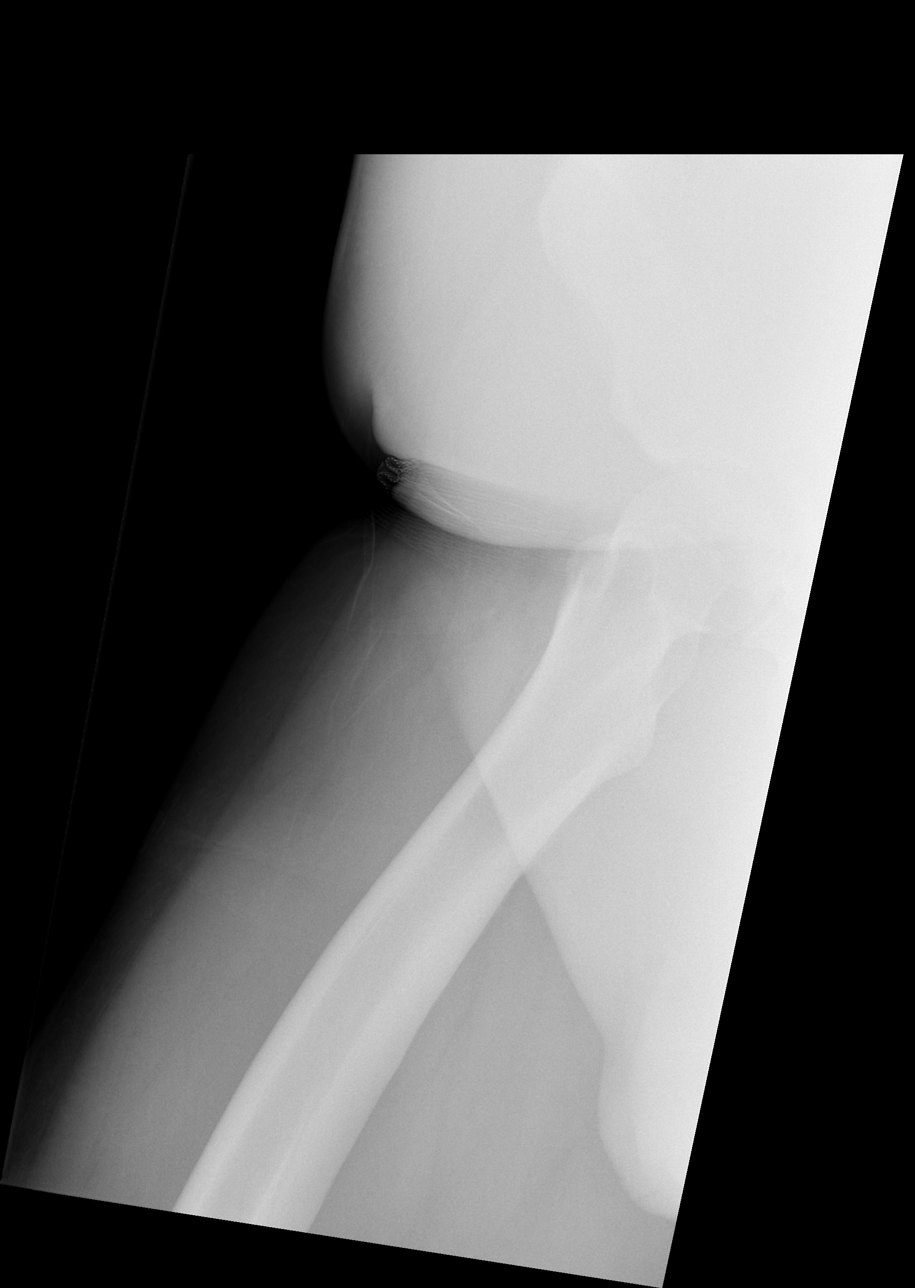

[4 of 4 positions shown; findings below may reference images not displayed]

FINDINGS: The bony pelvis is adequately mineralized. The sacrum and SI joints
are unremarkable. AP and frog-leg lateral views of the right hip
reveal the joint space to be preserved. The articular surfaces are
smoothly rounded. The soft tissues are unremarkable.
IMPRESSION: There is no acute or significant chronic bony abnormality of the
right femur.

## 2016-08-01 IMAGING — CR DG KNEE 1-2V*R*
2 series · 2 of 2 positions shown · non-contrast
Comparison: None.

CLINICAL DATA: Right knee pain for 10 years.

EXAM:
RIGHT KNEE - 1-2 VIEW

[w knee lat. right *]
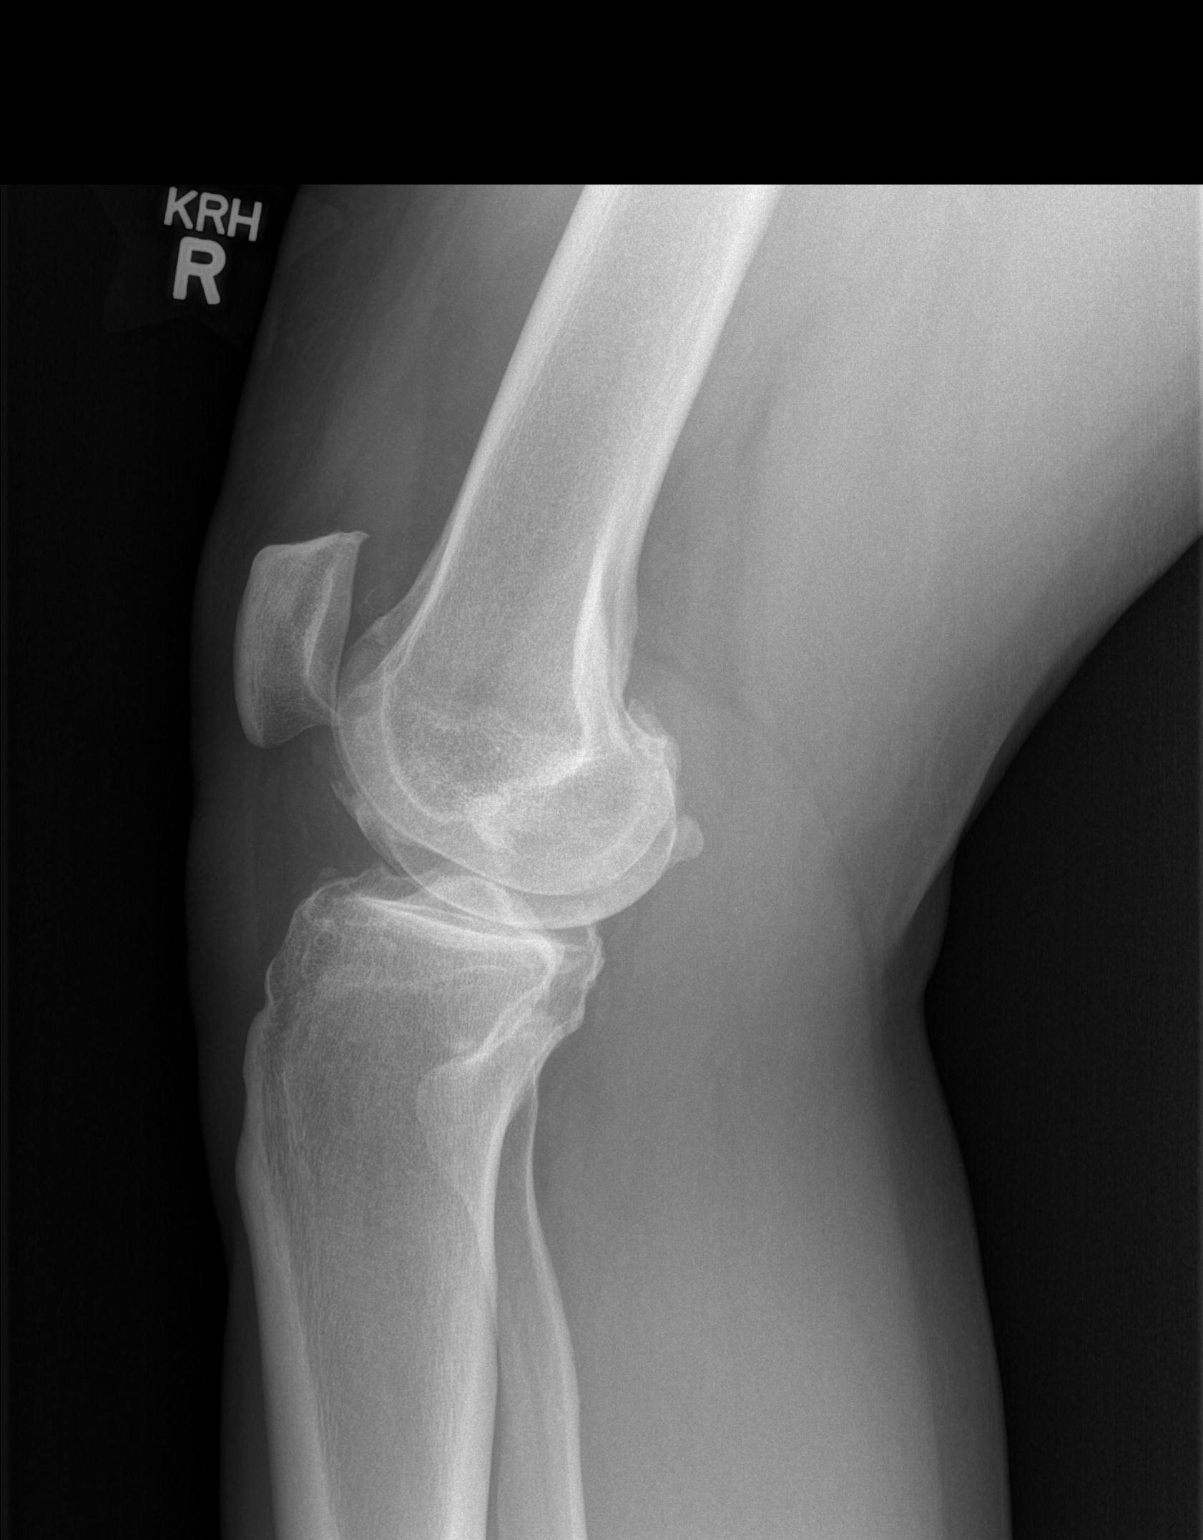

[w knee ap right *]
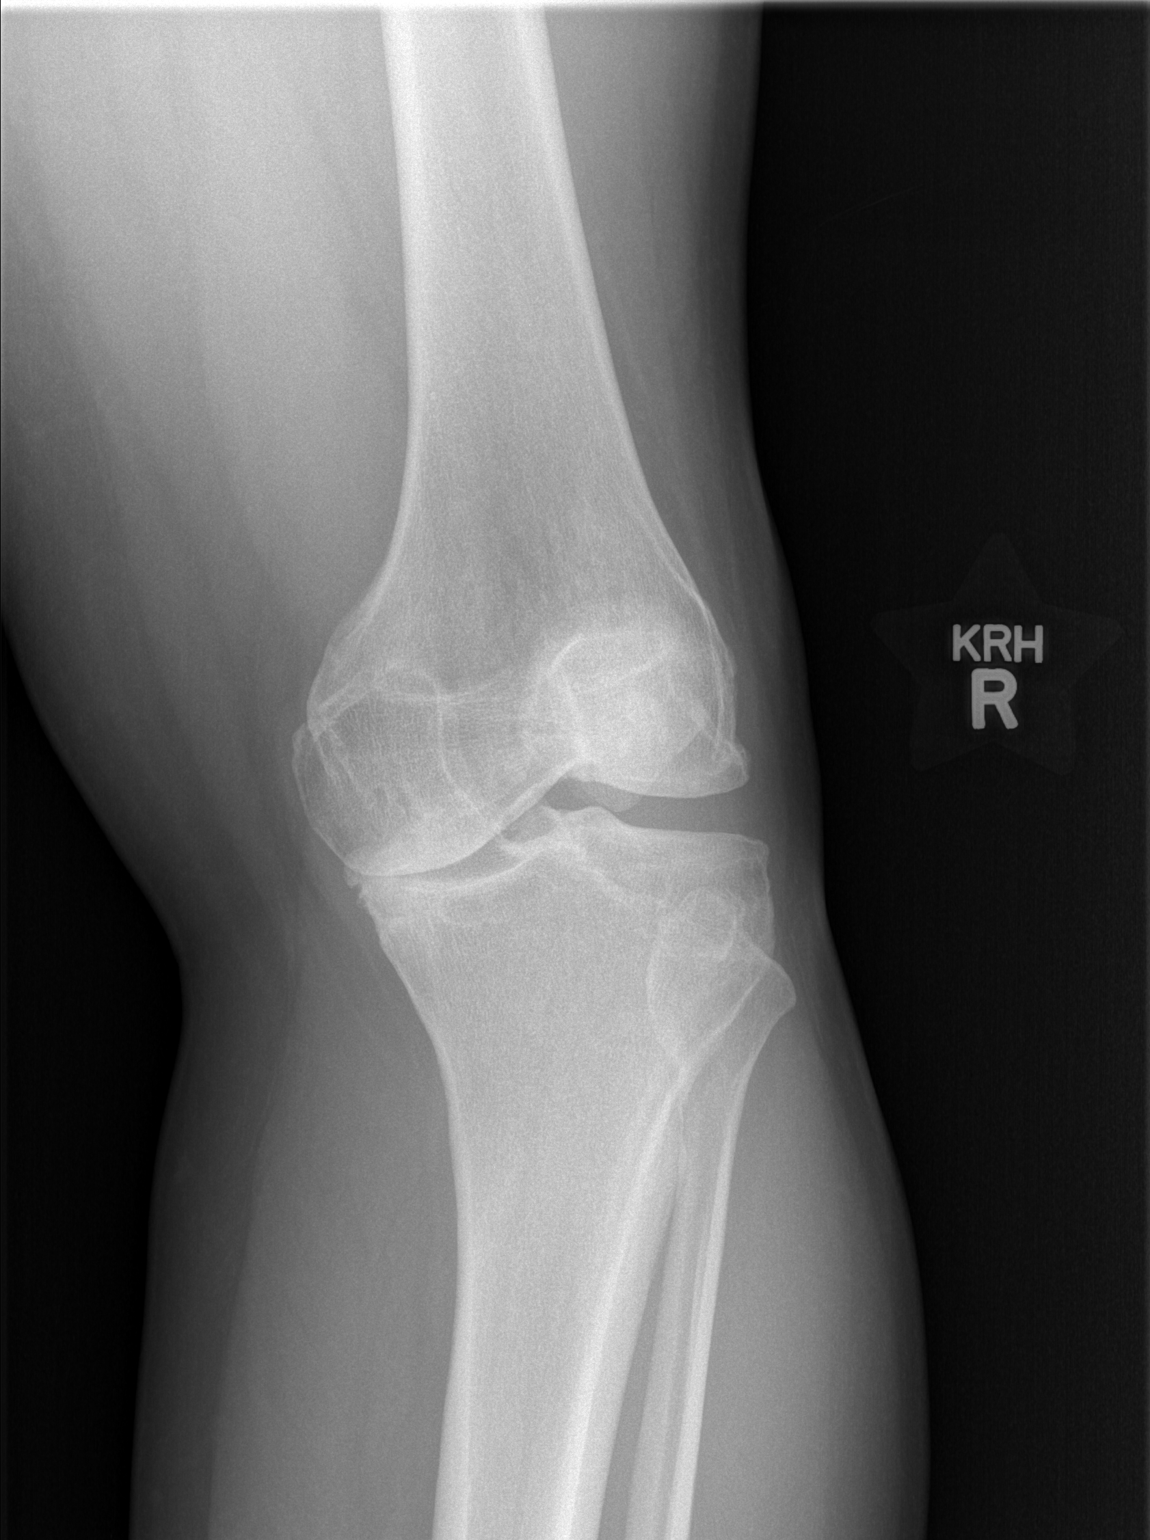

[2 of 2 positions shown; findings below may reference images not displayed]

FINDINGS: Small knee joint effusion. Severe tricompartment degenerative
change. Degenerative changes particularly severe about the medial
compartment. No evidence of fracture or dislocation.
IMPRESSION: Severe tricompartment degenerative change. Degenerative changes are
particularly severe about the medial compartment with near complete
loss of joint space . Small knee joint effusion. No acute bony
abnormality.

## 2016-09-21 ENCOUNTER — Other Ambulatory Visit: Payer: Self-pay | Admitting: Family Medicine

## 2016-10-28 ENCOUNTER — Other Ambulatory Visit: Payer: Self-pay | Admitting: Family Medicine

## 2016-10-28 ENCOUNTER — Other Ambulatory Visit: Payer: Self-pay

## 2016-10-28 DIAGNOSIS — R5382 Chronic fatigue, unspecified: Secondary | ICD-10-CM

## 2016-10-28 DIAGNOSIS — G4733 Obstructive sleep apnea (adult) (pediatric): Secondary | ICD-10-CM

## 2016-10-28 DIAGNOSIS — G44001 Cluster headache syndrome, unspecified, intractable: Secondary | ICD-10-CM

## 2016-10-28 DIAGNOSIS — M25561 Pain in right knee: Secondary | ICD-10-CM

## 2016-10-28 DIAGNOSIS — G8929 Other chronic pain: Secondary | ICD-10-CM

## 2016-10-28 DIAGNOSIS — E349 Endocrine disorder, unspecified: Secondary | ICD-10-CM

## 2016-10-28 DIAGNOSIS — E785 Hyperlipidemia, unspecified: Secondary | ICD-10-CM

## 2016-10-28 DIAGNOSIS — E663 Overweight: Secondary | ICD-10-CM

## 2016-10-28 DIAGNOSIS — E038 Other specified hypothyroidism: Secondary | ICD-10-CM

## 2016-10-28 DIAGNOSIS — M2550 Pain in unspecified joint: Secondary | ICD-10-CM

## 2016-10-28 NOTE — Telephone Encounter (Signed)
Faxed a hard copy of Tramadol 50mg  (take 1 tab po q8h prn); #30, 0RF to the [id number] [phone]).

## 2016-11-26 ENCOUNTER — Other Ambulatory Visit: Payer: Self-pay | Admitting: Family Medicine

## 2016-11-26 NOTE — Telephone Encounter (Signed)
Last ov 07-10-16  Next ov 01-08-17  Last refill 07-29-16   No UDS   No [facility] on file will have pt complete these when he picks up prescription.   Can someone please notify pt of [facility] policy when we call him to tell him his Rx is ready for2 pick up.

## 2016-11-27 ENCOUNTER — Other Ambulatory Visit: Payer: Self-pay | Admitting: Family Medicine

## 2016-11-28 NOTE — Telephone Encounter (Signed)
[facility] [place] [place]: [phone].

## 2016-12-01 NOTE — Telephone Encounter (Signed)
Prescription was faxed in to [facility] see refill note dated 11/27/2016

## 2016-12-21 ENCOUNTER — Other Ambulatory Visit: Payer: Self-pay | Admitting: Family Medicine

## 2016-12-25 ENCOUNTER — Other Ambulatory Visit: Payer: Self-pay | Admitting: Family Medicine

## 2017-01-08 ENCOUNTER — Encounter: Payer: Self-pay | Admitting: Family Medicine

## 2017-01-08 ENCOUNTER — Ambulatory Visit (INDEPENDENT_AMBULATORY_CARE_PROVIDER_SITE_OTHER): Payer: 59 | Admitting: Family Medicine

## 2017-01-08 DIAGNOSIS — R001 Bradycardia, unspecified: Secondary | ICD-10-CM

## 2017-01-08 DIAGNOSIS — M25551 Pain in right hip: Secondary | ICD-10-CM | POA: Diagnosis not present

## 2017-01-08 DIAGNOSIS — G8929 Other chronic pain: Secondary | ICD-10-CM

## 2017-01-08 DIAGNOSIS — R739 Hyperglycemia, unspecified: Secondary | ICD-10-CM | POA: Diagnosis not present

## 2017-01-08 DIAGNOSIS — E782 Mixed hyperlipidemia: Secondary | ICD-10-CM | POA: Diagnosis not present

## 2017-01-08 DIAGNOSIS — M545 Low back pain: Secondary | ICD-10-CM

## 2017-01-08 DIAGNOSIS — G2581 Restless legs syndrome: Secondary | ICD-10-CM

## 2017-01-08 DIAGNOSIS — E349 Endocrine disorder, unspecified: Secondary | ICD-10-CM | POA: Diagnosis not present

## 2017-01-08 DIAGNOSIS — E038 Other specified hypothyroidism: Secondary | ICD-10-CM | POA: Diagnosis not present

## 2017-01-08 HISTORY — DX: Restless legs syndrome: G25.81

## 2017-01-08 LAB — CBC
HCT: 44.6 % (ref 39.0–52.0)
HEMOGLOBIN: 14.9 g/dL (ref 13.0–17.0)
MCHC: 33.5 g/dL (ref 30.0–36.0)
MCV: 87.2 fl (ref 78.0–100.0)
PLATELETS: 249 10*3/uL (ref 150.0–400.0)
RBC: 5.12 Mil/uL (ref 4.22–5.81)
RDW: 13.3 % (ref 11.5–15.5)
WBC: 6.4 10*3/uL (ref 4.0–10.5)

## 2017-01-08 LAB — COMPREHENSIVE METABOLIC PANEL
ALK PHOS: 34 U/L — AB (ref 39–117)
ALT: 45 U/L (ref 0–53)
AST: 34 U/L (ref 0–37)
Albumin: 4.5 g/dL (ref 3.5–5.2)
BILIRUBIN TOTAL: 0.6 mg/dL (ref 0.2–1.2)
BUN: 21 mg/dL (ref 6–23)
CALCIUM: 9.7 mg/dL (ref 8.4–10.5)
CO2: 23 mEq/L (ref 19–32)
Chloride: 107 mEq/L (ref 96–112)
Creatinine, Ser: 0.83 mg/dL (ref 0.40–1.50)
GFR: 103.44 mL/min (ref >60.00)
Glucose, Bld: 83 mg/dL (ref 70–99)
Potassium: 3.9 mEq/L (ref 3.5–5.1)
Sodium: 136 mEq/L (ref 135–145)
TOTAL PROTEIN: 7.4 g/dL (ref 6.0–8.3)

## 2017-01-08 LAB — LIPID PANEL
Cholesterol: 175 mg/dL (ref 0–200)
HDL: 38.8 mg/dL — AB (ref >39.00)
LDL Cholesterol: 117 mg/dL — ABNORMAL HIGH (ref 0–99)
NONHDL: 136.18
TRIGLYCERIDES: 98 mg/dL (ref 0.0–149.0)
Total CHOL/HDL Ratio: 5
VLDL: 19.6 mg/dL (ref 0.0–40.0)

## 2017-01-08 LAB — TSH: TSH: 1.81 u[IU]/mL (ref 0.35–4.50)

## 2017-01-08 NOTE — Assessment & Plan Note (Signed)
b/l hips worse with sitting R>L,

## 2017-01-08 NOTE — Assessment & Plan Note (Signed)
Tolerating statin, encouraged heart healthy diet, avoid trans fats, minimize simple carbs and saturated fats. Increase exercise as tolerated 

## 2017-01-08 NOTE — Progress Notes (Signed)
Subjective:    Patient ID: Dustin Spears, male    DOB: 1966-06-16, 51 y.o.   MRN: [id number]  Chief Complaint  Patient presents with  . Follow-up    HPI  Patient is in today for follow up. Patient complains of restless legs, he has a past medical history of DM, low testosterone, obesity, hyperlipidemia and sleep apnea. He is noting trouble getting his legs to feel comfortable in the evenings. Denies polyuria or polydipsia. . Denies CP/palp/SOB/HA/congestion/fevers/GI or GU c/o. Taking meds as prescribed Patient Care Team: Bradd CanaryStacey A Jossue Rubenstein, MD as PCP - General (Family Medicine)   Past Medical History:  Diagnosis Date  . ALLERGIC RHINITIS 03/12/2010  . Allergy    seasonal  . Bradycardia 01/11/2017  . FATIGUE 03/12/2010  . Headache(784.0) 03/12/2010  . History of chicken pox   . Hyperglycemia 07/10/2016  . Hyperlipidemia   . Knee pain, right   . Morbid obesity (HCC) 02/26/2011  . Neck pain, acute 03/12/2011  . Overweight(278.02) 02/26/2011  . Preventative health care 11/04/2012  . RLS (restless legs syndrome) 01/08/2017  . SLEEP APNEA, OBSTRUCTIVE 04/24/2010  . SNORING 03/12/2010  . Sports physical 07/16/2012  . Tendonitis 05/19/2012  . Testosterone deficiency 10/19/2011  . Thyroid disease     Past Surgical History:  Procedure Laterality Date  . bicep tendon in left arm  2000    Family History  Problem Relation Age of Onset  . Hyperlipidemia Mother   . Lupus Mother   . Alcohol abuse Father     smoked  . Arthritis Father   . Lupus Paternal Grandmother   . Arthritis Maternal Grandmother   . Ulcers Maternal Grandmother   . Alzheimer's disease Maternal Grandfather     Social History   Social History  . Marital status: Married    Spouse name: N/A  . Number of children: 1  . Years of education: N/A   Occupational History  . 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 [employer]    Social History Main Topics  . Smoking status: Never Smoker  . Smokeless tobacco: Never Used  . Alcohol  use Yes     Comment: occasional  . Drug use: No  . Sexual activity: Yes   Other Topics Concern  . Not on file   Social History Narrative   Grew up in [place]   Moved to [place] 1990    Outpatient Medications Prior to Visit  Medication Sig Dispense Refill  . Calcium Citrate-Vitamin D 200-250 MG-UNIT TABS Take 1 capsule by mouth daily. 120 tablet 0  . cetirizine (ZYRTEC) 10 MG tablet Take 10 mg by mouth daily. Reported on 01/03/2016    . diclofenac (VOLTAREN) 75 MG EC tablet TAKE 1 TABLET (75 MG TOTAL) BY MOUTH 2 (TWO) TIMES DAILY. 30 tablet 6  . fish oil-omega-3 fatty acids 1000 MG capsule Take 2 g by mouth daily.    . fluticasone (FLONASE) 50 MCG/ACT nasal spray Place 2 sprays into both nostrils daily. 16 g 6  . glucosamine-chondroitin 500-400 MG tablet Take 1 tablet by mouth daily.      Marland Kitchen. levothyroxine (SYNTHROID, LEVOTHROID) 50 MCG tablet Take 1 tablet (50 mcg total) by mouth daily. 90 tablet 2  . levothyroxine (SYNTHROID, LEVOTHROID) 50 MCG tablet TAKE 1 TABLET BY MOUTH EVERY DAY 90 tablet 2  . Misc. Devices (NASADOCK) MISC by Does not apply route.    . naproxen (NAPROSYN) 500 MG tablet TAKE 1 TABLET BY MOUTH TWICE A DAY WITH A  MEAL AS NEEDED FOR PAIN 60 tablet 0  . rosuvastatin (CRESTOR) 10 MG tablet TAKE 1 TABLET BY MOUTH EVERY DAY 90 tablet 0  . Testosterone 10 MG/ACT (2%) GEL APPLY 4 PUMPS ONTO THE SKIN ONCE DAILY 60 g 1  . traMADol (ULTRAM) 50 MG tablet TAKE 1 TABLET BY MOUTH EVERY 8 HOURS AS NEEDED 30 tablet 0  . Wheat Dextrin (BENEFIBER) POWD 2 tsp po daily in liquids 730 g 0   No facility-administered medications prior to visit.     No Known Allergies  Review of Systems  Constitutional: Positive for malaise/fatigue. Negative for fever.  HENT: Negative for congestion.   Eyes: Negative for blurred vision.  Respiratory: Negative for cough and shortness of breath.   Cardiovascular: Negative for chest pain, palpitations and leg swelling.  Gastrointestinal: Negative for  vomiting.  Musculoskeletal: Positive for back pain, joint pain and myalgias.  Skin: Negative for rash.  Neurological: Negative for loss of consciousness and headaches.       Objective:    Physical Exam  Constitutional: He is oriented to person, place, and time. He appears well-developed and well-nourished. No distress.  HENT:  Head: Normocephalic and atraumatic.  Eyes: Conjunctivae are normal.  Neck: Normal range of motion. No thyromegaly present.  Cardiovascular: Normal rate and regular rhythm.   Pulmonary/Chest: Effort normal and breath sounds normal. He has no wheezes.  Abdominal: Soft. Bowel sounds are normal. There is no tenderness.  Musculoskeletal: Normal range of motion. He exhibits no edema or deformity.  Neurological: He is alert and oriented to person, place, and time.  Skin: Skin is warm and dry. He is not diaphoretic.  Psychiatric: He has a normal mood and affect.    There were no vitals taken for this visit. Wt Readings from Last 3 Encounters:  07/10/16 248 lb (112.5 kg)  02/18/16 244 lb 4 oz (110.8 kg)  01/03/16 243 lb 8 oz (110.5 kg)   BP Readings from Last 3 Encounters:  07/10/16 132/72  02/18/16 110/82  01/03/16 114/78     Immunization History  Administered Date(s) Administered  . Td 03/10/2000, 06/02/2006  . Tdap 11/04/2012    Health Maintenance  Topic Date Due  . HIV Screening  02/10/1980  . INFLUENZA VACCINE  08/10/2017 (Originally 05/20/2016)  . Fecal DNA (Cologuard)  07/11/2019  . TETANUS/TDAP  11/04/2022    Lab Results  Component Value Date   WBC 6.4 01/08/2017   HGB 14.9 01/08/2017   HCT 44.6 01/08/2017   PLT 249.0 01/08/2017   GLUCOSE 83 01/08/2017   CHOL 175 01/08/2017   TRIG 98.0 01/08/2017   HDL 38.80 (L) 01/08/2017   LDLDIRECT 181.0 02/18/2016   LDLCALC 117 (H) 01/08/2017   ALT 45 01/08/2017   AST 34 01/08/2017   NA 136 01/08/2017   K 3.9 01/08/2017   CL 107 01/08/2017   CREATININE 0.83 01/08/2017   BUN 21 01/08/2017    CO2 23 01/08/2017   TSH 1.81 01/08/2017   PSA 0.64 05/16/2015   HGBA1C 5.9 07/10/2016    Lab Results  Component Value Date   TSH 1.81 01/08/2017   Lab Results  Component Value Date   WBC 6.4 01/08/2017   HGB 14.9 01/08/2017   HCT 44.6 01/08/2017   MCV 87.2 01/08/2017   PLT 249.0 01/08/2017   Lab Results  Component Value Date   NA 136 01/08/2017   K 3.9 01/08/2017   CO2 23 01/08/2017   GLUCOSE 83 01/08/2017   BUN 21 01/08/2017  CREATININE 0.83 01/08/2017   BILITOT 0.6 01/08/2017   ALKPHOS 34 (L) 01/08/2017   AST 34 01/08/2017   ALT 45 01/08/2017   PROT 7.4 01/08/2017   ALBUMIN 4.5 01/08/2017   CALCIUM 9.7 01/08/2017   GFR 103.44 01/08/2017   Lab Results  Component Value Date   CHOL 175 01/08/2017   Lab Results  Component Value Date   HDL 38.80 (L) 01/08/2017   Lab Results  Component Value Date   LDLCALC 117 (H) 01/08/2017   Lab Results  Component Value Date   TRIG 98.0 01/08/2017   Lab Results  Component Value Date   CHOLHDL 5 01/08/2017   Lab Results  Component Value Date   HGBA1C 5.9 07/10/2016         Assessment & Plan:   Problem List Items Addressed This Visit    Hypothyroidism - Primary    On Levothyroxine, continue to monitor      Relevant Orders   TSH (Completed)   Hyperlipidemia    Tolerating statin, encouraged heart healthy diet, avoid trans fats, minimize simple carbs and saturated fats. Increase exercise as tolerated      Relevant Orders   CBC (Completed)   Comprehensive metabolic panel (Completed)   Lipid panel (Completed)   Morbid obesity (HCC)    Encouraged DASH diet, decrease po intake and increase exercise as tolerated. Needs 7-8 hours of sleep nightly. Avoid trans fats, eat small, frequent meals every 4-5 hours with lean proteins, complex carbs and healthy fats. Minimize simple carbs, offered info on bariatrics service.       Testosterone deficiency    Tolerating supplementation      Hip pain    b/l hips worse  with sitting R>L,       Relevant Orders   Ambulatory referral to Sports Medicine   Hyperglycemia    hgba1c acceptable, minimize simple carbs. Increase exercise as tolerated.       Restless leg syndrome    Encouraged increased hydration and iron in diet and if symptoms do not improve consider course of requip      Bradycardia    Mild, asymptomatic, EKG performed today, if patient develops any concerning symptoms should seek care and consider referral to cardiology      Relevant Orders   EKG 12-Lead (Completed)    Other Visit Diagnoses    Chronic bilateral low back pain, with sciatica presence unspecified       Relevant Orders   Ambulatory referral to Sports Medicine      I am having Mr. Mehaffey maintain his glucosamine-chondroitin, BENEFIBER, Calcium Citrate-Vitamin D, fish oil-omega-3 fatty acids, NASADOCK, cetirizine, levothyroxine, naproxen, diclofenac, fluticasone, levothyroxine, traMADol, Testosterone, and rosuvastatin.  No orders of the defined types were placed in this encounter.    [name], MD

## 2017-01-08 NOTE — Patient Instructions (Signed)
DRINK PLETNY OF WATER 64 ounces A DAY  Try the water for about 2 weeks if your symptoms   Restless Legs Syndrome Restless legs syndrome is a condition that causes uncomfortable feelings or sensations in the legs, especially while sitting or lying down. The sensations usually cause an overwhelming urge to move the legs. The arms can also sometimes be affected. The condition can range from mild to severe. The symptoms often interfere with a person's ability to sleep. What are the causes? The cause of this condition is not known. What increases the risk? This condition is more likely to develop in:  People who are older than age 52.  Pregnant women. In general, restless legs syndrome is more common in women than in men.  People who have a family history of the condition.  People who have certain medical conditions, such as iron deficiency, kidney disease, Parkinson disease, or nerve damage.  People who take certain medicines, such as medicines for high blood pressure, nausea, colds, allergies, depression, and some heart conditions. What are the signs or symptoms? The main symptom of this condition is uncomfortable sensations in the legs. These sensations may be:  Described as pulling, tingling, prickling, throbbing, crawling, or burning.  Worse while you are sitting or lying down.  Worse during periods of rest or inactivity.  Worse at night, often interfering with your sleep.  Accompanied by a very strong urge to move your legs.  Temporarily relieved by movement of your legs. The sensations usually affect both sides of the body. The arms can also be affected, but this is rare. People who have this condition often have tiredness during the day because of their lack of sleep at night. How is this diagnosed? This condition may be diagnosed based on your description of the symptoms. You may also have tests, including blood tests, to check for other conditions that may lead to your  symptoms. In some cases, you may be asked to spend some time in a sleep lab so your sleeping can be monitored. How is this treated? Treatment for this condition is focused on managing the symptoms. Treatment may include:  Self-help and lifestyle changes.  Medicines. Follow these instructions at home:  Take medicines only as directed by your health care provider.  Try these methods to get temporary relief from the uncomfortable sensations:  Massage your legs.  Walk or stretch.  Take a cold or hot bath.  Practice good sleep habits. For example, go to bed and get up at the same time every day.  Exercise regularly.  Practice ways of relaxing, such as yoga or meditation.  Avoid caffeine and alcohol.  Do not use any tobacco products, including cigarettes, chewing tobacco, or electronic cigarettes. If you need help quitting, ask your health care provider.  Keep all follow-up visits as directed by your health care provider. This is important. Contact a health care provider if: Your symptoms do not improve with treatment, or they get worse. This information is not intended to replace advice given to you by your health care provider. Make sure you discuss any questions you have with your health care provider. Document Released: 09/26/2002 Document Revised: 03/13/2016 Document Reviewed: 10/02/2014 Elsevier Interactive Patient Education  2017 [employer]

## 2017-01-08 NOTE — Assessment & Plan Note (Signed)
hgba1c acceptable, minimize simple carbs. Increase exercise as tolerated.  

## 2017-01-08 NOTE — Progress Notes (Signed)
Pre visit review using our clinic review tool, if applicable. No additional management support is needed unless otherwise documented below in the visit note. 

## 2017-01-11 ENCOUNTER — Encounter: Payer: Self-pay | Admitting: Family Medicine

## 2017-01-11 DIAGNOSIS — R001 Bradycardia, unspecified: Secondary | ICD-10-CM

## 2017-01-11 HISTORY — DX: Bradycardia, unspecified: R00.1

## 2017-01-11 NOTE — Assessment & Plan Note (Signed)
Tolerating supplementation

## 2017-01-11 NOTE — Assessment & Plan Note (Signed)
Encouraged DASH diet, decrease po intake and increase exercise as tolerated. Needs 7-8 hours of sleep nightly. Avoid trans fats, eat small, frequent meals every 4-5 hours with lean proteins, complex carbs and healthy fats. Minimize simple carbs, offered info on bariatrics service.

## 2017-01-11 NOTE — Assessment & Plan Note (Signed)
Mild, asymptomatic, EKG performed today, if patient develops any concerning symptoms should seek care and consider referral to cardiology

## 2017-01-11 NOTE — Assessment & Plan Note (Signed)
Encouraged increased hydration and iron in diet and if symptoms do not improve consider course of requip

## 2017-01-11 NOTE — Assessment & Plan Note (Signed)
On Levothyroxine, continue to monitor 

## 2017-01-19 ENCOUNTER — Ambulatory Visit (INDEPENDENT_AMBULATORY_CARE_PROVIDER_SITE_OTHER): Payer: 59 | Admitting: Sports Medicine

## 2017-01-19 ENCOUNTER — Encounter: Payer: Self-pay | Admitting: Sports Medicine

## 2017-01-19 VITALS — BP 132/82 | HR 72 | Ht 69.0 in | Wt 247.2 lb

## 2017-01-19 DIAGNOSIS — M9905 Segmental and somatic dysfunction of pelvic region: Secondary | ICD-10-CM | POA: Diagnosis not present

## 2017-01-19 DIAGNOSIS — M25561 Pain in right knee: Secondary | ICD-10-CM

## 2017-01-19 DIAGNOSIS — M25551 Pain in right hip: Secondary | ICD-10-CM | POA: Diagnosis not present

## 2017-01-19 DIAGNOSIS — M9902 Segmental and somatic dysfunction of thoracic region: Secondary | ICD-10-CM

## 2017-01-19 DIAGNOSIS — M9903 Segmental and somatic dysfunction of lumbar region: Secondary | ICD-10-CM

## 2017-01-19 DIAGNOSIS — G8929 Other chronic pain: Secondary | ICD-10-CM

## 2017-01-19 NOTE — Patient Instructions (Addendum)
Please perform the exercise program that Dustin Spears has prepared for you and gone over in detail on a daily basis.  In addition to the handout you were provided you can access your program through: [email or web address]   Your unique program code is: [email or web address]

## 2017-01-19 NOTE — Progress Notes (Signed)
PROCEDURE NOTE: THERAPEUTIC EXERCISES (97110) 15 minutes spent for Therapeutic exercises as stated in above notes.  This included exercises focusing on stretching, strengthening, with significant focus on eccentric aspects.   Proper technique shown and discussed handout in great detail with ATC.  All questions were discussed and answered.   

## 2017-01-19 NOTE — Progress Notes (Signed)
OFFICE VISIT NOTE Dustin Spears. [facility] [phone]  Kirk Basquez - 51 y.o. male MRN [id number]  Date of birth: 04-28-1966  Visit Date: 01/19/2017  PCP: [name], MD   Referred by: Bradd Canary, MD  SUBJECTIVE:   Chief Complaint  Patient presents with  . pain in right hip    right hip pain x 1 year. Pain was constant but now is off and on. Pain is worse when sitting in bed. Pain is about a 7 when he has a flare-up. He is tried Tramadol and Voltaten with some relief.    HPI: As above. Additional pertinent information includes: Patient presents with the above symptoms.  Has been seen by chiropractic with moderate improvement in his symptoms with these have recurred.  Pain is mainly located in the right lateral buttock with occasional pain radiating down the posterior aspect of the legs but never past mid thigh.  Medications are only mildly helpful when he overdoes things.  Symptoms are most severe while laying on his right side.  Prior XRays of R hip were normal in 2016.   ROS: Review of Systems  Constitutional: Negative for chills, fever, malaise/fatigue and weight loss.  Cardiovascular: Negative for chest pain and leg swelling.  Gastrointestinal: Negative for blood in stool, melena, nausea and vomiting.  Skin: Negative for rash.  Neurological: Negative for dizziness, tingling, sensory change, focal weakness, weakness and headaches.  Endo/Heme/Allergies: Does not bruise/bleed easily.    Otherwise per HPI.  HISTORY & PERTINENT PRIOR DATA:  No specialty comments available. He reports that he has never smoked. He has never used smokeless tobacco.   Recent Labs  02/18/16 1431 07/10/16 1509  HGBA1C 5.9 5.9   Medications & Allergies reviewed per EMR Patient Active Problem List   Diagnosis Date Noted  . Bradycardia 01/11/2017  . Restless leg syndrome 01/08/2017  . Hyperglycemia 07/10/2016  . Knee pain,  chronic 01/18/2015  . Hip pain 01/18/2015  . Arthralgia 01/18/2015  . Strain of tendon of right rotator cuff 05/18/2014  . Otitis media 02/05/2014  . Skin lesion 01/14/2014  . Preventative health care 11/04/2012  . Tendonitis 05/19/2012  . Testosterone deficiency 10/19/2011  . Hyperlipidemia 02/26/2011  . Morbid obesity (HCC) 02/26/2011  . Knee pain, right   . History of chicken pox   . Obstructive sleep apnea 04/24/2010  . Hypothyroidism 03/13/2010  . ALLERGIC RHINITIS 03/12/2010  . Fatigue 03/12/2010  . Headache 03/12/2010   Past Medical History:  Diagnosis Date  . ALLERGIC RHINITIS 03/12/2010  . Allergy    seasonal  . Bradycardia 01/11/2017  . FATIGUE 03/12/2010  . Headache(784.0) 03/12/2010  . History of chicken pox   . Hyperglycemia 07/10/2016  . Hyperlipidemia   . Knee pain, right   . Morbid obesity (HCC) 02/26/2011  . Neck pain, acute 03/12/2011  . Overweight(278.02) 02/26/2011  . Preventative health care 11/04/2012  . RLS (restless legs syndrome) 01/08/2017  . SLEEP APNEA, OBSTRUCTIVE 04/24/2010  . SNORING 03/12/2010  . Sports physical 07/16/2012  . Tendonitis 05/19/2012  . Testosterone deficiency 10/19/2011  . Thyroid disease    Family History  Problem Relation Age of Onset  . Hyperlipidemia Mother   . Lupus Mother   . Alcohol abuse Father     smoked  . Arthritis Father   . Lupus Paternal Grandmother   . Arthritis Maternal Grandmother   . Ulcers Maternal Grandmother   . Alzheimer's disease Maternal Grandfather  Past Surgical History:  Procedure Laterality Date  . bicep tendon in left arm  2000   Social History   Occupational History  . 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 [employer]    Social History Main Topics  . Smoking status: Never Smoker  . Smokeless tobacco: Never Used  . Alcohol use Yes     Comment: occasional  . Drug use: No  . Sexual activity: Yes    OBJECTIVE:  VS:  HT:5\' 9"  (175.3 cm)   WT:247 lb 3.2 oz (112.1 kg)  BMI:36.6    BP:132/82   HR:72bpm  TEMP: ( )  RESP:95 % Physical Exam  Constitutional: He appears well-developed and well-nourished. He is cooperative.  Non-toxic appearance.  HENT:  Head: Normocephalic and atraumatic.  Cardiovascular: Intact distal pulses.   Pulmonary/Chest: No accessory muscle usage. No respiratory distress.  Neurological: He is alert. He is not disoriented. He displays normal reflexes. No sensory deficit.  Skin: Skin is warm, dry and intact. Capillary refill takes less than 2 seconds. No abrasion and no rash noted.  Psychiatric: He has a normal mood and affect. His speech is normal and behavior is normal. Thought content normal.   Exam right leg: Negative straight leg raise. No pain with internal and external rotation of the hip.  Normal logroll.  Pain-free FADIR and Faber testing Lower extremity sensation is intact.  Mild TTP along the distal aspect of the IT band.  IMAGING: No results found.  Findings:  PROCEDURE NOTE : OSTEOPATHIC MANIPULATION The decision today to treat with Osteopathic Manipulative Therapy (OMT) was based on physical exam findings. Verbal consent was obtained after after explanation of risks, benefits and potential side effects, including acute pain flare, post manipulation soreness and need for repeat treatments.  If Cervical manipulation was performed additional time was spent discussing the associated minimal risk of  injury to neurovascular structures.  After consent was obtained manipulation was performed as below:            Regions treated:  Per billing codes          Techniques used:  Direct, Muscle Energy, HVLA and ART The patient tolerated the treatment well and reported Improved symptoms following treatment today. Patient was given medications, exercises, stretches and lifestyle modifications per AVS and verbally.      ASSESSMENT & PLAN:  Visit Diagnoses:  1. Pain of right hip joint   2. Somatic dysfunction of thoracic region   3. Somatic dysfunction of  lumbar region   4. Somatic dysfunction of pelvis region   5. Chronic pain of right knee    Meds: No orders of the defined types were placed in this encounter.   Orders: No orders of the defined types were placed in this encounter.   Follow-up: Return in about 6 weeks (around 03/02/2017).   Otherwise please see problem oriented charting and procedure notes as below.

## 2017-01-25 ENCOUNTER — Other Ambulatory Visit: Payer: Self-pay | Admitting: Family Medicine

## 2017-01-26 NOTE — Telephone Encounter (Signed)
[facility] [place]

## 2017-02-02 NOTE — Assessment & Plan Note (Signed)
Likely some component of IT band syndrome secondary to weak hip abductor's.

## 2017-02-02 NOTE — Assessment & Plan Note (Signed)
Chronic acutely worsened condition  - likely glute tendonopathy vs high lumbar radicular component 1. Therapeutic exercises reviewed in detail today.  Focus on core conditioning as well as hip abduction strengthening. 2. If any lack of improvement further diagnostic workup with MSK ultrasound versus MRI of the hip

## 2017-02-06 ENCOUNTER — Other Ambulatory Visit: Payer: Self-pay | Admitting: Family Medicine

## 2017-02-06 DIAGNOSIS — G4733 Obstructive sleep apnea (adult) (pediatric): Secondary | ICD-10-CM

## 2017-02-06 DIAGNOSIS — G8929 Other chronic pain: Secondary | ICD-10-CM

## 2017-02-06 DIAGNOSIS — M25561 Pain in right knee: Secondary | ICD-10-CM

## 2017-02-06 DIAGNOSIS — E663 Overweight: Secondary | ICD-10-CM

## 2017-02-06 DIAGNOSIS — E785 Hyperlipidemia, unspecified: Secondary | ICD-10-CM

## 2017-02-06 DIAGNOSIS — M2550 Pain in unspecified joint: Secondary | ICD-10-CM

## 2017-02-06 DIAGNOSIS — G44001 Cluster headache syndrome, unspecified, intractable: Secondary | ICD-10-CM

## 2017-02-06 DIAGNOSIS — R5382 Chronic fatigue, unspecified: Secondary | ICD-10-CM

## 2017-02-06 DIAGNOSIS — E038 Other specified hypothyroidism: Secondary | ICD-10-CM

## 2017-02-06 DIAGNOSIS — E349 Endocrine disorder, unspecified: Secondary | ICD-10-CM

## 2017-02-09 ENCOUNTER — Encounter: Payer: Self-pay | Admitting: Family Medicine

## 2017-02-09 NOTE — Telephone Encounter (Signed)
OK to refill but needs UDS and contract 

## 2017-02-09 NOTE — Telephone Encounter (Signed)
Requesting:   Tramadol Contract     none UDS    None Last OV   01/08/2017---future 07/14/17 Last Refill    #30 no refills on 10/28/2016  Please Advise

## 2017-02-09 NOTE — Telephone Encounter (Signed)
Printed hardcopy/contract/ Called the patient informed he would need to pickup hardcopy/do contract/UDS--had to leave a detailed messag.

## 2017-02-18 MED FILL — traMADol HCL 50 MG TABS: 50 | 7 days supply | Qty: 21 | Fill #0

## 2017-02-19 ENCOUNTER — Encounter: Payer: Self-pay | Admitting: Family Medicine

## 2017-03-09 ENCOUNTER — Ambulatory Visit (INDEPENDENT_AMBULATORY_CARE_PROVIDER_SITE_OTHER): Payer: 59 | Admitting: Sports Medicine

## 2017-03-09 ENCOUNTER — Encounter: Payer: Self-pay | Admitting: Sports Medicine

## 2017-03-09 VITALS — BP 118/80 | HR 64 | Ht 69.0 in | Wt 244.6 lb

## 2017-03-09 DIAGNOSIS — M25551 Pain in right hip: Secondary | ICD-10-CM | POA: Diagnosis not present

## 2017-03-09 DIAGNOSIS — M9903 Segmental and somatic dysfunction of lumbar region: Secondary | ICD-10-CM

## 2017-03-09 DIAGNOSIS — M9905 Segmental and somatic dysfunction of pelvic region: Secondary | ICD-10-CM

## 2017-03-09 DIAGNOSIS — M9902 Segmental and somatic dysfunction of thoracic region: Secondary | ICD-10-CM

## 2017-03-09 NOTE — Progress Notes (Signed)
OFFICE VISIT NOTE Veverly FellsMichael D. Delorise Shinerigby, DO  [facility] [phone]  Alycia PattenBrian Hamon - 51 y.o. male MRN [id number]  Date of birth: 11-09-1965  Visit Date: 03/09/2017  PCP: Bradd CanaryBlyth, Stacey A, MD   Referred by: Bradd CanaryBlyth, Stacey A, MD  Orlie DakinBrandy Shelton, CMA acting as scribe for Dr. Berline Choughigby.  SUBJECTIVE:   Chief Complaint  Patient presents with  . Follow-up    right hip pain   HPI: As below and per problem based documentation when appropriate.  Pt presents today in follow-up of right hip pain.  right hip pain x 1+ year.  Pain was constant but now is off and on.  Pain is worse when sitting in bed. Symptoms are most severe while laying on his right side. Pain is about a 7/10 when he has a flare-up.  He is tried Tramadol and Voltaten with some relief. Medications are only mildly helpful when he overdoes things. Has been seen by chiropractic with moderate improvement in his symptoms but these have recurred.  Pain is mainly located in the right lateral buttock with occasional pain radiating down the posterior aspect of the legs but never past mid thigh.  Pt reports improvement after osteopathic manipulation 01/19/17.     Prior XRays of R hip were normal in 2016.  Pt denies fever, chills, night sweats.     Review of Systems  Constitutional: Negative.   HENT: Negative.        Seasonal allergies  Eyes: Negative.   Respiratory: Negative.   Cardiovascular: Negative.   Gastrointestinal: Negative.   Genitourinary: Negative.   Musculoskeletal: Positive for joint pain and myalgias. Negative for falls.  Skin: Negative.   Neurological: Positive for headaches.  Endo/Heme/Allergies: Negative.   Psychiatric/Behavioral: Negative for depression. The patient does not have insomnia.     Otherwise per HPI.  HISTORY & PERTINENT PRIOR DATA:  No specialty comments available. He reports that he has never smoked. He has never used smokeless tobacco.   Recent  Labs  07/10/16 1509  HGBA1C 5.9   Medications & Allergies reviewed per EMR Patient Active Problem List   Diagnosis Date Noted  . Bradycardia 01/11/2017  . Restless leg syndrome 01/08/2017  . Hyperglycemia 07/10/2016  . Knee pain, chronic 01/18/2015  . Hip pain 01/18/2015  . Arthralgia 01/18/2015  . Strain of tendon of right rotator cuff 05/18/2014  . Otitis media 02/05/2014  . Skin lesion 01/14/2014  . Preventative health care 11/04/2012  . Tendonitis 05/19/2012  . Testosterone deficiency 10/19/2011  . Hyperlipidemia 02/26/2011  . Morbid obesity (HCC) 02/26/2011  . Knee pain, right   . History of chicken pox   . Obstructive sleep apnea 04/24/2010  . Hypothyroidism 03/13/2010  . ALLERGIC RHINITIS 03/12/2010  . Fatigue 03/12/2010  . Headache 03/12/2010   Past Medical History:  Diagnosis Date  . ALLERGIC RHINITIS 03/12/2010  . Allergy    seasonal  . Bradycardia 01/11/2017  . FATIGUE 03/12/2010  . Headache(784.0) 03/12/2010  . History of chicken pox   . Hyperglycemia 07/10/2016  . Hyperlipidemia   . Knee pain, right   . Morbid obesity (HCC) 02/26/2011  . Neck pain, acute 03/12/2011  . Overweight(278.02) 02/26/2011  . Preventative health care 11/04/2012  . RLS (restless legs syndrome) 01/08/2017  . SLEEP APNEA, OBSTRUCTIVE 04/24/2010  . SNORING 03/12/2010  . Sports physical 07/16/2012  . Tendonitis 05/19/2012  . Testosterone deficiency 10/19/2011  . Thyroid disease    Family History  Problem Relation  Age of Onset  . Hyperlipidemia Mother   . Lupus Mother   . Alcohol abuse Father        smoked  . Arthritis Father   . Lupus Paternal Grandmother   . Arthritis Maternal Grandmother   . Ulcers Maternal Grandmother   . Alzheimer's disease Maternal Grandfather    Past Surgical History:  Procedure Laterality Date  . bicep tendon in left arm  2000   Social History   Occupational History  . 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 [employer]    Social History Main Topics  . Smoking  status: Never Smoker  . Smokeless tobacco: Never Used  . Alcohol use Yes     Comment: occasional  . Drug use: No  . Sexual activity: Yes    OBJECTIVE:  VS:  HT:5\' 9"  (175.3 cm)   WT:244 lb 9.6 oz (110.9 kg)  BMI:36.2    BP:118/80  HR:64bpm  TEMP: ( )  RESP:97 % EXAM: Findings:  WDWN, NAD, Non-toxic appearing Alert & appropriately interactive Not depressed or anxious appearing No increased work of breathing. Pupils are equal. EOM intact without nystagmus No clubbing or cyanosis of the extremities appreciated No significant rashes/lesions/ulcerations overlying the examined area. DP & PT pulses 2+/4.  No significant pretibial edema.  No clubbing or cyanosis Sensation intact to light touch in lower extremities.  Back & Lower Extremities: Bilateral negative straight leg raise. No significant midline tenderness.   TTP over the right glute medius musculature. Good internal and external rotation of the hips. 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 ASSESSMENT & PLAN:   Problem List Items Addressed This Visit    Hip pain - Primary    Patient reports overall great improvement with osteopathic manipulation previously.  He has been performing his therapeutic exercises typically only for 3 days per week.  He does report improvement when he does these.  Overall this is significantly dressed the day-to-day interference that he is having.  He is not interested in further evaluation at this time.  Encouraged to continue working on therapeutic exercises emphasizing 6 days per week.  PROCEDURE NOTE : OSTEOPATHIC MANIPULATION The decision today to treat with Osteopathic Manipulative Therapy (OMT) was based on physical exam findings. Verbal consent was obtained after after explanation of risks,  benefits and potential side effects, including acute pain flare, post manipulation soreness and need for repeat treatments.  If Cervical manipulation was performed additional time was spent discussing the associated minimal risk of  injury to neurovascular structures.  After consent was obtained manipulation was performed as below:            Regions treated:  Per billing codes          Techniques used:  Direct, Muscle Energy and HVLA (good response to long lever) The patient tolerated the treatment well and reported Improved symptoms following treatment today. Patient was given medications, exercises, stretches and lifestyle modifications per AVS and verbally.          Other Visit Diagnoses    Somatic dysfunction of thoracic region       Somatic dysfunction of lumbar region       Somatic dysfunction of pelvis region          Follow-up: Return for repeat clinical exam,  consideration of repeat Osteopathic Manipulation.   CMA/ATC served as Neurosurgeon during this visit. History, Physical, and Plan performed by medical provider. Documentation and orders reviewed and attested to.      Gaspar Bidding, DO    [facility] Phys[facility]

## 2017-03-09 NOTE — Assessment & Plan Note (Signed)
Patient reports overall great improvement with osteopathic manipulation previously.  He has been performing his therapeutic exercises typically only for 3 days per week.  He does report improvement when he does these.  Overall this is significantly dressed the day-to-day interference that he is having.  He is not interested in further evaluation at this time.  Encouraged to continue working on therapeutic exercises emphasizing 6 days per week.  PROCEDURE NOTE : OSTEOPATHIC MANIPULATION The decision today to treat with Osteopathic Manipulative Therapy (OMT) was based on physical exam findings. Verbal consent was obtained after after explanation of risks, benefits and potential side effects, including acute pain flare, post manipulation soreness and need for repeat treatments.  If Cervical manipulation was performed additional time was spent discussing the associated minimal risk of  injury to neurovascular structures.  After consent was obtained manipulation was performed as below:            Regions treated:  Per billing codes          Techniques used:  Direct, Muscle Energy and HVLA (good response to long lever) The patient tolerated the treatment well and reported Improved symptoms following treatment today. Patient was given medications, exercises, stretches and lifestyle modifications per AVS and verbally.

## 2017-03-25 ENCOUNTER — Other Ambulatory Visit: Payer: Self-pay | Admitting: Family Medicine

## 2017-03-26 NOTE — Telephone Encounter (Signed)
[place] [facility] [place]

## 2017-04-08 ENCOUNTER — Other Ambulatory Visit: Payer: Self-pay | Admitting: Family Medicine

## 2017-04-27 ENCOUNTER — Other Ambulatory Visit: Payer: Self-pay | Admitting: Family Medicine

## 2017-04-28 NOTE — Telephone Encounter (Signed)
[facility] [place]

## 2017-05-04 ENCOUNTER — Ambulatory Visit: Payer: 59 | Admitting: Sports Medicine

## 2017-05-21 ENCOUNTER — Encounter: Payer: Managed Care, Other (non HMO) | Admitting: Family Medicine

## 2017-05-27 ENCOUNTER — Telehealth: Payer: Self-pay | Admitting: Family Medicine

## 2017-05-28 NOTE — Telephone Encounter (Signed)
[facility] [place]

## 2017-05-28 NOTE — Telephone Encounter (Signed)
Requesting:  Testosterone gel Contract     02/18/2017 UDS   Low risk on 08/21/2017 Last OV    07/10/2016---next appt is on 07/14/2017 Last Refill    60g on 04/27/2017  Please Advise

## 2017-05-30 ENCOUNTER — Other Ambulatory Visit: Payer: Self-pay | Admitting: Family Medicine

## 2017-06-01 ENCOUNTER — Other Ambulatory Visit: Payer: Self-pay | Admitting: Family Medicine

## 2017-06-02 NOTE — Telephone Encounter (Signed)
Refilled completed

## 2017-06-03 ENCOUNTER — Other Ambulatory Visit: Payer: Self-pay | Admitting: Family Medicine

## 2017-06-03 NOTE — Telephone Encounter (Signed)
[facility] Testosterone #60g and 0RF phoned in.

## 2017-06-03 NOTE — Telephone Encounter (Addendum)
°  Relation to WU:JWJXpt:self Call back number:[phone] Pharmacy:  [facility]  Reason for call:  Patient states pharmacy never received Testosterone 10 MG/ACT (2%) GEL rx,, requesting rx resent to pharmacy, please advise   [id number] [place], [place] - [address] [phone] (Phone) [phone] (Fax)

## 2017-06-20 ENCOUNTER — Other Ambulatory Visit: Payer: Self-pay | Admitting: Family Medicine

## 2017-07-02 NOTE — Telephone Encounter (Addendum)
°  Relation to VW:UJWJpt:self Call back number: [phone] Pharmacy: [id number] [place], [place] - [address] [phone] (Phone) [phone] (Fax)     Reason for call: spouse states pharmacy sent in request 06/03/17 for Testosterone 10 MG/ACT (2%) GEL, spouse states patient will run out, requesting refill sent today, please advise

## 2017-07-03 MED ORDER — TESTOSTERONE 10 MG/ACT (2%) TD GEL: TRANSDERMAL | 0 refills | Status: DC

## 2017-07-03 NOTE — Addendum Note (Signed)
Addended by: Crissie Sickles A on: 07/03/2017 08:05 AM   Modules accepted: Orders

## 2017-07-03 NOTE — Telephone Encounter (Signed)
Requesting:testosterone Contract: UDS: Last OV:03/09/17 Next OV:07/14/17 Last Refill:05/28/17    Please advise

## 2017-07-06 ENCOUNTER — Other Ambulatory Visit: Payer: Self-pay

## 2017-07-06 MED ORDER — TESTOSTERONE 10 MG/ACT (2%) TD GEL: TRANSDERMAL | 0 refills | Status: DC

## 2017-07-06 NOTE — Telephone Encounter (Signed)
Dustin Spears called in rx   PC

## 2017-07-14 ENCOUNTER — Encounter: Payer: Self-pay | Admitting: Family Medicine

## 2017-07-14 ENCOUNTER — Ambulatory Visit (INDEPENDENT_AMBULATORY_CARE_PROVIDER_SITE_OTHER): Payer: 59 | Admitting: Family Medicine

## 2017-07-14 VITALS — BP 131/72 | HR 68 | Temp 97.8°F | Ht 69.0 in | Wt 243.0 lb

## 2017-07-14 DIAGNOSIS — G4733 Obstructive sleep apnea (adult) (pediatric): Secondary | ICD-10-CM

## 2017-07-14 DIAGNOSIS — E038 Other specified hypothyroidism: Secondary | ICD-10-CM | POA: Diagnosis not present

## 2017-07-14 DIAGNOSIS — M25561 Pain in right knee: Secondary | ICD-10-CM

## 2017-07-14 DIAGNOSIS — E663 Overweight: Secondary | ICD-10-CM

## 2017-07-14 DIAGNOSIS — M2550 Pain in unspecified joint: Secondary | ICD-10-CM | POA: Diagnosis not present

## 2017-07-14 DIAGNOSIS — G44001 Cluster headache syndrome, unspecified, intractable: Secondary | ICD-10-CM

## 2017-07-14 DIAGNOSIS — R5382 Chronic fatigue, unspecified: Secondary | ICD-10-CM | POA: Diagnosis not present

## 2017-07-14 DIAGNOSIS — E782 Mixed hyperlipidemia: Secondary | ICD-10-CM

## 2017-07-14 DIAGNOSIS — E349 Endocrine disorder, unspecified: Secondary | ICD-10-CM | POA: Diagnosis not present

## 2017-07-14 DIAGNOSIS — E785 Hyperlipidemia, unspecified: Secondary | ICD-10-CM

## 2017-07-14 DIAGNOSIS — Z0000 Encounter for general adult medical examination without abnormal findings: Secondary | ICD-10-CM | POA: Diagnosis not present

## 2017-07-14 DIAGNOSIS — G8929 Other chronic pain: Secondary | ICD-10-CM | POA: Diagnosis not present

## 2017-07-14 DIAGNOSIS — R739 Hyperglycemia, unspecified: Secondary | ICD-10-CM | POA: Diagnosis not present

## 2017-07-14 MED ORDER — TRAMADOL HCL 50 MG PO TABS: 50.0000 mg | ORAL_TABLET | Freq: Two times a day (BID) | ORAL | 0 refills | Status: DC | PRN

## 2017-07-14 MED ORDER — TESTOSTERONE 10 MG/ACT (2%) TD GEL: TRANSDERMAL | 5 refills | Status: DC

## 2017-07-14 NOTE — Assessment & Plan Note (Signed)
Tolerating statin, encouraged heart healthy diet, avoid trans fats, minimize simple carbs and saturated fats. Increase exercise as tolerated 

## 2017-07-14 NOTE — Assessment & Plan Note (Signed)
hgba1c acceptable, minimize simple carbs. Increase exercise as tolerated.  

## 2017-07-14 NOTE — Assessment & Plan Note (Signed)
Encouraged DASH diet, decrease po intake and increase exercise as tolerated. Needs 7-8 hours of sleep nightly. Avoid trans fats, eat small, frequent meals every 4-5 hours with lean proteins, complex carbs and healthy fats. Minimize simple carbs 

## 2017-07-14 NOTE — Assessment & Plan Note (Signed)
Check level 

## 2017-07-14 NOTE — Patient Instructions (Signed)
Shingrix new shingles shot 2 shots over 2-6 months, call insurance to see if they will cover   Preventive Care 40-64 Years, Male Preventive care refers to lifestyle choices and visits with your health care provider that can promote health and wellness. What does preventive care include?  A yearly physical exam. This is also called an annual well check.  Dental exams once or twice a year.  Routine eye exams. Ask your health care provider how often you should have your eyes checked.  Personal lifestyle choices, including: ? Daily care of your teeth and gums. ? Regular physical activity. ? Eating a healthy diet. ? Avoiding tobacco and drug use. ? Limiting alcohol use. ? Practicing safe sex. ? Taking low-dose aspirin every day starting at age 8. What happens during an annual well check? The services and screenings done by your health care provider during your annual well check will depend on your age, overall health, lifestyle risk factors, and family history of disease. Counseling Your health care provider may ask you questions about your:  Alcohol use.  Tobacco use.  Drug use.  Emotional well-being.  Home and relationship well-being.  Sexual activity.  Eating habits.  Work and work Statistician.  Screening You may have the following tests or measurements:  Height, weight, and BMI.  Blood pressure.  Lipid and cholesterol levels. These may be checked every 5 years, or more frequently if you are over 74 years old.  Skin check.  Lung cancer screening. You may have this screening every year starting at age 27 if you have a 30-pack-year history of smoking and currently smoke or have quit within the past 15 years.  Fecal occult blood test (FOBT) of the stool. You may have this test every year starting at age 27.  Flexible sigmoidoscopy or colonoscopy. You may have a sigmoidoscopy every 5 years or a colonoscopy every 10 years starting at age 72.  Prostate cancer  screening. Recommendations will vary depending on your family history and other risks.  Hepatitis C blood test.  Hepatitis B blood test.  Sexually transmitted disease (STD) testing.  Diabetes screening. This is done by checking your blood sugar (glucose) after you have not eaten for a while (fasting). You may have this done every 1-3 years.  Discuss your test results, treatment options, and if necessary, the need for more tests with your health care provider. Vaccines Your health care provider may recommend certain vaccines, such as:  Influenza vaccine. This is recommended every year.  Tetanus, diphtheria, and acellular pertussis (Tdap, Td) vaccine. You may need a Td booster every 10 years.  Varicella vaccine. You may need this if you have not been vaccinated.  Zoster vaccine. You may need this after age 15.  Measles, mumps, and rubella (MMR) vaccine. You may need at least one dose of MMR if you were born in 1957 or later. You may also need a second dose.  Pneumococcal 13-valent conjugate (PCV13) vaccine. You may need this if you have certain conditions and have not been vaccinated.  Pneumococcal polysaccharide (PPSV23) vaccine. You may need one or two doses if you smoke cigarettes or if you have certain conditions.  Meningococcal vaccine. You may need this if you have certain conditions.  Hepatitis A vaccine. You may need this if you have certain conditions or if you travel or work in places where you may be exposed to hepatitis A.  Hepatitis B vaccine. You may need this if you have certain conditions or if you travel  or work in places where you may be exposed to hepatitis B.  Haemophilus influenzae type b (Hib) vaccine. You may need this if you have certain risk factors.  Talk to your health care provider about which screenings and vaccines you need and how often you need them. This information is not intended to replace advice given to you by your health care provider. Make  sure you discuss any questions you have with your health care provider. Document Released: 11/02/2015 Document Revised: 06/25/2016 Document Reviewed: 08/07/2015 Elsevier Interactive Patient Education  2017 [employer].

## 2017-07-14 NOTE — Assessment & Plan Note (Signed)
On Levothyroxine, continue to monitor 

## 2017-07-14 NOTE — Assessment & Plan Note (Signed)
Patient encouraged to maintain heart healthy diet, regular exercise, adequate sleep. Consider daily probiotics. Take medications as prescribed 

## 2017-07-14 NOTE — Assessment & Plan Note (Signed)
Struggles with chronic knee pain but continues to work and walk a couple of miles a day. Encouraged ice and topical treatments.

## 2017-07-15 LAB — LIPID PANEL
Cholesterol: 180 mg/dL (ref 0–200)
HDL: 41 mg/dL (ref >39.00)
LDL Cholesterol: 101 mg/dL — ABNORMAL HIGH (ref 0–99)
NonHDL: 139.48
TRIGLYCERIDES: 192 mg/dL — AB (ref 0.0–149.0)
Total CHOL/HDL Ratio: 4
VLDL: 38.4 mg/dL (ref 0.0–40.0)

## 2017-07-15 LAB — COMPREHENSIVE METABOLIC PANEL
ALBUMIN: 4.5 g/dL (ref 3.5–5.2)
ALK PHOS: 31 U/L — AB (ref 39–117)
ALT: 26 U/L (ref 0–53)
AST: 22 U/L (ref 0–37)
BILIRUBIN TOTAL: 0.5 mg/dL (ref 0.2–1.2)
BUN: 19 mg/dL (ref 6–23)
CALCIUM: 9.7 mg/dL (ref 8.4–10.5)
CO2: 25 mEq/L (ref 19–32)
Chloride: 105 mEq/L (ref 96–112)
Creatinine, Ser: 0.94 mg/dL (ref 0.40–1.50)
GFR: 89.42 mL/min (ref >60.00)
Glucose, Bld: 83 mg/dL (ref 70–99)
Potassium: 4.2 mEq/L (ref 3.5–5.1)
Sodium: 137 mEq/L (ref 135–145)
TOTAL PROTEIN: 7.4 g/dL (ref 6.0–8.3)

## 2017-07-15 LAB — CBC
HEMATOCRIT: 48.8 % (ref 39.0–52.0)
HEMOGLOBIN: 16.2 g/dL (ref 13.0–17.0)
MCHC: 33.2 g/dL (ref 30.0–36.0)
MCV: 88.7 fl (ref 78.0–100.0)
Platelets: 247 10*3/uL (ref 150.0–400.0)
RBC: 5.5 Mil/uL (ref 4.22–5.81)
RDW: 13.7 % (ref 11.5–15.5)
WBC: 7.1 10*3/uL (ref 4.0–10.5)

## 2017-07-15 LAB — HEMOGLOBIN A1C: Hgb A1c MFr Bld: 5.8 % (ref 4.6–6.5)

## 2017-07-15 LAB — TSH: TSH: 2.87 u[IU]/mL (ref 0.35–4.50)

## 2017-07-15 LAB — TESTOSTERONE: TESTOSTERONE: 459.14 ng/dL (ref 300.00–890.00)

## 2017-07-17 ENCOUNTER — Other Ambulatory Visit: Payer: Self-pay | Admitting: Family Medicine

## 2017-07-19 NOTE — Progress Notes (Signed)
Patient ID: Dustin Spears, male   DOB: 04-14-1966, 51 y.o.   MRN: [id number]   Subjective:    Patient ID: Dustin Spears, male    DOB: Oct 02, 1966, 51 y.o.   MRN: [id number]  Chief Complaint  Patient presents with  . Annual Exam    HPI Patient is in today for annual preventative exam and follow up on hyperlipidemia, low testosterone, hypothyroidism, and hyperglycemia. He feels well today. No recent febrile illness or acute hospitalizations. No polyuria or polydipsia. Denies CP/palp/SOB/HA/congestion/fevers/GI or GU c/o. Taking meds as prescribed. Doing well with ADLs and continues to work full time. Tries to maintain a low carb diet.   Past Medical History:  Diagnosis Date  . ALLERGIC RHINITIS 03/12/2010  . Allergy    seasonal  . Bradycardia 01/11/2017  . FATIGUE 03/12/2010  . Headache(784.0) 03/12/2010  . History of chicken pox   . Hyperglycemia 07/10/2016  . Hyperlipidemia   . Knee pain, right   . Morbid obesity (HCC) 02/26/2011  . Morbid obesity (HCC) 02/26/2011  . Neck pain, acute 03/12/2011  . Overweight(278.02) 02/26/2011  . Preventative health care 11/04/2012  . RLS (restless legs syndrome) 01/08/2017  . SLEEP APNEA, OBSTRUCTIVE 04/24/2010  . SNORING 03/12/2010  . Sports physical 07/16/2012  . Tendonitis 05/19/2012  . Testosterone deficiency 10/19/2011  . Thyroid disease     Past Surgical History:  Procedure Laterality Date  . bicep tendon in left arm  2000    Family History  Problem Relation Age of Onset  . Hyperlipidemia Mother   . Lupus Mother   . Alcohol abuse Father        smoked  . Arthritis Father   . Lupus Paternal Grandmother   . Arthritis Maternal Grandmother   . Ulcers Maternal Grandmother   . Alzheimer's disease Maternal Grandfather     Social History   Social History  . Marital status: Married    Spouse name: N/A  . Number of children: 1  . Years of education: N/A   Occupational History  . 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 [employer]    Social History Main  Topics  . Smoking status: Never Smoker  . Smokeless tobacco: Never Used  . Alcohol use Yes     Comment: occasional  . Drug use: No  . Sexual activity: Yes   Other Topics Concern  . Not on file   Social History Narrative   Grew up in [place] to [employer] 1990    Outpatient Medications Prior to Visit  Medication Sig Dispense Refill  . Calcium Citrate-Vitamin D 200-250 MG-UNIT TABS Take 1 capsule by mouth daily. 120 tablet 0  . cetirizine (ZYRTEC) 10 MG tablet Take 10 mg by mouth daily. Reported on 01/03/2016    . diclofenac (VOLTAREN) 75 MG EC tablet TAKE 1 TABLET (75 MG TOTAL) BY MOUTH 2 (TWO) TIMES DAILY. 60 tablet 2  . fish oil-omega-3 fatty acids 1000 MG capsule Take 2 g by mouth daily.    Marland Kitchen levothyroxine (SYNTHROID, LEVOTHROID) 50 MCG tablet TAKE 1 TABLET BY MOUTH EVERY DAY 90 tablet 2  . Misc. Devices (NASADOCK) MISC by Does not apply route.    . rosuvastatin (CRESTOR) 10 MG tablet TAKE 1 TABLET BY MOUTH EVERY DAY 90 tablet 0  . Wheat Dextrin (BENEFIBER) POWD 2 tsp po daily in liquids 730 g 0  . levothyroxine (SYNTHROID, LEVOTHROID) 50 MCG tablet Take 1 tablet (50 mcg total) by mouth daily. 90 tablet 2  .  naproxen (NAPROSYN) 500 MG tablet TAKE 1 TABLET BY MOUTH TWICE A DAY WITH A MEAL AS NEEDED FOR PAIN 60 tablet 0  . Testosterone 10 MG/ACT (2%) GEL APPLY 4 PUMPS ONTO THE SKIN ONCE DAILY 60 g 0  . traMADol (ULTRAM) 50 MG tablet TAKE 1 TABLET BY MOUTH EVERY 8 HOURS AS NEEDED 30 tablet 0  . fluticasone (FLONASE) 50 MCG/ACT nasal spray Place 2 sprays into both nostrils daily. 16 g 6  . glucosamine-chondroitin 500-400 MG tablet Take 1 tablet by mouth daily.       No facility-administered medications prior to visit.     No Known Allergies  Review of Systems  Constitutional: Positive for malaise/fatigue. Negative for fever.  HENT: Negative for congestion.   Eyes: Negative for blurred vision.  Respiratory: Negative for shortness of breath.   Cardiovascular: Negative for chest  pain, palpitations and leg swelling.  Gastrointestinal: Negative for abdominal pain, blood in stool and nausea.  Genitourinary: Negative for dysuria and frequency.  Musculoskeletal: Negative for falls.  Skin: Negative for rash.  Neurological: Negative for dizziness, loss of consciousness and headaches.  Endo/Heme/Allergies: Negative for environmental allergies.  Psychiatric/Behavioral: Negative for depression. The patient is not nervous/anxious.        Objective:    Physical Exam  Constitutional: He is oriented to person, place, and time. He appears well-developed and well-nourished. No distress.  HENT:  Head: Normocephalic and atraumatic.  Nose: Nose normal.  Eyes: Right eye exhibits no discharge. Left eye exhibits no discharge.  Neck: Normal range of motion. Neck supple.  Cardiovascular: Normal rate and regular rhythm.   No murmur heard. Pulmonary/Chest: Effort normal and breath sounds normal.  Abdominal: Soft. Bowel sounds are normal. There is no tenderness.  Musculoskeletal: He exhibits no edema.  Neurological: He is alert and oriented to person, place, and time.  Skin: Skin is warm and dry.  Psychiatric: He has a normal mood and affect.  Nursing note and vitals reviewed.   BP 131/72   Pulse 68   Temp 97.8 F (36.6 C) (Oral)   Ht  (1.753 m)   Wt 243 lb (110.2 kg)   SpO2 98%   BMI 35.88 kg/m  Wt Readings from Last 3 Encounters:  07/14/17 243 lb (110.2 kg)  03/09/17 244 lb 9.6 oz (110.9 kg)  01/19/17 247 lb 3.2 oz (112.1 kg)     Lab Results  Component Value Date   WBC 7.1 07/14/2017   HGB 16.2 07/14/2017   HCT 48.8 07/14/2017   PLT 247.0 07/14/2017   GLUCOSE 83 07/14/2017   CHOL 180 07/14/2017   TRIG 192.0 (H) 07/14/2017   HDL 41.00 07/14/2017   LDLDIRECT 181.0 02/18/2016   LDLCALC 101 (H) 07/14/2017   ALT 26 07/14/2017   AST 22 07/14/2017   NA 137 07/14/2017   K 4.2 07/14/2017   CL 105 07/14/2017   CREATININE 0.94 07/14/2017   BUN 19 07/14/2017    CO2 25 07/14/2017   TSH 2.87 07/14/2017   PSA 0.64 05/16/2015   HGBA1C 5.8 07/14/2017    Lab Results  Component Value Date   TSH 2.87 07/14/2017   Lab Results  Component Value Date   WBC 7.1 07/14/2017   HGB 16.2 07/14/2017   HCT 48.8 07/14/2017   MCV 88.7 07/14/2017   PLT 247.0 07/14/2017   Lab Results  Component Value Date   NA 137 07/14/2017   K 4.2 07/14/2017   CO2 25 07/14/2017   GLUCOSE 83 07/14/2017  BUN 19 07/14/2017   CREATININE 0.94 07/14/2017   BILITOT 0.5 07/14/2017   ALKPHOS 31 (L) 07/14/2017   AST 22 07/14/2017   ALT 26 07/14/2017   PROT 7.4 07/14/2017   ALBUMIN 4.5 07/14/2017   CALCIUM 9.7 07/14/2017   GFR 89.42 07/14/2017   Lab Results  Component Value Date   CHOL 180 07/14/2017   Lab Results  Component Value Date   HDL 41.00 07/14/2017   Lab Results  Component Value Date   LDLCALC 101 (H) 07/14/2017   Lab Results  Component Value Date   TRIG 192.0 (H) 07/14/2017   Lab Results  Component Value Date   CHOLHDL 4 07/14/2017   Lab Results  Component Value Date   HGBA1C 5.8 07/14/2017       Assessment & Plan:   Problem List Items Addressed This Visit    Hypothyroidism    On Levothyroxine, continue to monitor      Relevant Orders   TSH (Completed)   Obstructive sleep apnea   Fatigue   Headache   Relevant Medications   traMADol (ULTRAM) 50 MG tablet   Hyperlipidemia    Tolerating statin, encouraged heart healthy diet, avoid trans fats, minimize simple carbs and saturated fats. Increase exercise as tolerated      Relevant Orders   Lipid panel (Completed)   Morbid obesity (HCC)    Encouraged DASH diet, decrease po intake and increase exercise as tolerated. Needs 7-8 hours of sleep nightly. Avoid trans fats, eat small, frequent meals every 4-5 hours with lean proteins, complex carbs and healthy fats. Minimize simple carbs      Knee pain, right    Struggles with chronic knee pain but continues to work and walk a couple of  miles a day. Encouraged ice and topical treatments.       Relevant Medications   traMADol (ULTRAM) 50 MG tablet   Testosterone deficiency    Check level      Relevant Orders   Testosterone (Completed)   Preventative health care    Patient encouraged to maintain heart healthy diet, regular exercise, adequate sleep. Consider daily probiotics. Take medications as prescribed.      Relevant Orders   CBC (Completed)   Knee pain, chronic   Relevant Medications   traMADol (ULTRAM) 50 MG tablet   Arthralgia   Hyperglycemia - Primary    hgba1c acceptable, minimize simple carbs. Increase exercise as tolerated.       Relevant Orders   Hemoglobin A1c (Completed)   CBC (Completed)   Comprehensive metabolic panel (Completed)    Other Visit Diagnoses    Overweight          I have discontinued Mr. Ditullio's glucosamine-chondroitin, naproxen, and fluticasone. I am also having him maintain his BENEFIBER, Calcium Citrate-Vitamin D, fish oil-omega-3 fatty acids, NASADOCK, cetirizine, rosuvastatin, diclofenac, levothyroxine, Testosterone, and traMADol.  Meds ordered this encounter  Medications  . DISCONTD: traMADol (ULTRAM) 50 MG tablet    Sig: Take 1 tablet (50 mg total) by mouth every 12 (twelve) hours as needed.    Dispense:  30 tablet    Refill:  0    Not to exceed 5 additional fills before 04/26/2017  . Testosterone 10 MG/ACT (2%) GEL    Sig: APPLY 4 PUMPS ONTO THE SKIN ONCE DAILY    Dispense:  60 g    Refill:  5    Not to exceed 5 additional fills before 10/25/2017  . traMADol (ULTRAM) 50 MG tablet  Sig: Take 1 tablet (50 mg total) by mouth every 12 (twelve) hours as needed.    Dispense:  30 tablet    Refill:  0    Not to exceed 5 additional fills before 04/26/2017    CMA served as scribe during this visit. History, Physical and Plan performed by medical provider. Documentation and orders reviewed and attested to.  [name], MD

## 2017-11-06 ENCOUNTER — Telehealth: Payer: Self-pay | Admitting: Family Medicine

## 2017-11-06 NOTE — Telephone Encounter (Signed)
Left patient a message appt for 01/12/18 needs to be rescheduled per pcp.

## 2017-11-23 ENCOUNTER — Other Ambulatory Visit: Payer: Self-pay | Admitting: Family Medicine

## 2017-11-23 DIAGNOSIS — G8929 Other chronic pain: Secondary | ICD-10-CM

## 2017-11-23 DIAGNOSIS — M25561 Pain in right knee: Principal | ICD-10-CM

## 2017-11-23 NOTE — Telephone Encounter (Signed)
Requesting:tramadol Contract:yes UDS:low risk next screen 08/21/17 Last OV:07/14/17 Next OV:01/12/17 Last Refill:07/14/17 #[id number] Database:11/23/17 no concerns    Please advise

## 2017-11-30 ENCOUNTER — Other Ambulatory Visit: Payer: Self-pay

## 2017-11-30 MED ORDER — DICLOFENAC SODIUM 75 MG PO TBEC: DELAYED_RELEASE_TABLET | ORAL | 2 refills | Status: DC

## 2018-01-12 ENCOUNTER — Ambulatory Visit: Payer: 59 | Admitting: Family Medicine

## 2018-01-21 ENCOUNTER — Ambulatory Visit (INDEPENDENT_AMBULATORY_CARE_PROVIDER_SITE_OTHER): Payer: BLUE CROSS/BLUE SHIELD | Admitting: Family Medicine

## 2018-01-21 VITALS — BP 112/68 | HR 75 | Temp 98.1°F | Resp 18 | Wt 246.0 lb

## 2018-01-21 DIAGNOSIS — R739 Hyperglycemia, unspecified: Secondary | ICD-10-CM | POA: Diagnosis not present

## 2018-01-21 DIAGNOSIS — J309 Allergic rhinitis, unspecified: Secondary | ICD-10-CM

## 2018-01-21 DIAGNOSIS — E782 Mixed hyperlipidemia: Secondary | ICD-10-CM | POA: Diagnosis not present

## 2018-01-21 DIAGNOSIS — G4733 Obstructive sleep apnea (adult) (pediatric): Secondary | ICD-10-CM

## 2018-01-21 DIAGNOSIS — G2581 Restless legs syndrome: Secondary | ICD-10-CM | POA: Diagnosis not present

## 2018-01-21 DIAGNOSIS — E349 Endocrine disorder, unspecified: Secondary | ICD-10-CM

## 2018-01-21 DIAGNOSIS — E038 Other specified hypothyroidism: Secondary | ICD-10-CM

## 2018-01-21 NOTE — Assessment & Plan Note (Signed)
hgba1c acceptable, minimize simple carbs. Increase exercise as tolerated.  

## 2018-01-21 NOTE — Patient Instructions (Addendum)
Hyaluronic acid, NOW company online at [email or web address] Shingrix is the new shingles, 2 shots over 2-6 months at office or at pharmacy, check with insurance regarding coverage  Restless Legs Syndrome Restless legs syndrome is a condition that causes uncomfortable feelings or sensations in the legs, especially while sitting or lying down. The sensations usually cause an overwhelming urge to move the legs. The arms can also sometimes be affected. The condition can range from mild to severe. The symptoms often interfere with a person's ability to sleep. What are the causes? The cause of this condition is not known. What increases the risk? This condition is more likely to develop in:  People who are older than age 53.  Pregnant women. In general, restless legs syndrome is more common in women than in men.  People who have a family history of the condition.  People who have certain medical conditions, such as iron deficiency, kidney disease, Parkinson disease, or nerve damage.  People who take certain medicines, such as medicines for high blood pressure, nausea, colds, allergies, depression, and some heart conditions.  What are the signs or symptoms? The main symptom of this condition is uncomfortable sensations in the legs. These sensations may be:  Described as pulling, tingling, prickling, throbbing, crawling, or burning.  Worse while you are sitting or lying down.  Worse during periods of rest or inactivity.  Worse at night, often interfering with your sleep.  Accompanied by a very strong urge to move your legs.  Temporarily relieved by movement of your legs.  The sensations usually affect both sides of the body. The arms can also be affected, but this is rare. People who have this condition often have tiredness during the day because of their lack of sleep at night. How is this diagnosed? This condition may be diagnosed based on your description of the symptoms. You may also  have tests, including blood tests, to check for other conditions that may lead to your symptoms. In some cases, you may be asked to spend some time in a sleep lab so your sleeping can be monitored. How is this treated? Treatment for this condition is focused on managing the symptoms. Treatment may include:  Self-help and lifestyle changes.  Medicines.  Follow these instructions at home:  Take medicines only as directed by your health care provider.  Try these methods to get temporary relief from the uncomfortable sensations: ? Massage your legs. ? Walk or stretch. ? Take a cold or hot bath.  Practice good sleep habits. For example, go to bed and get up at the same time every day.  Exercise regularly.  Practice ways of relaxing, such as yoga or meditation.  Avoid caffeine and alcohol.  Do not use any tobacco products, including cigarettes, chewing tobacco, or electronic cigarettes. If you need help quitting, ask your health care provider.  Keep all follow-up visits as directed by your health care provider. This is important. Contact a health care provider if: Your symptoms do not improve with treatment, or they get worse. This information is not intended to replace advice given to you by your health care provider. Make sure you discuss any questions you have with your health care provider. Document Released: 09/26/2002 Document Revised: 03/13/2016 Document Reviewed: 10/02/2014 Elsevier Interactive Patient Education  [employer]2018 [employer]

## 2018-01-21 NOTE — Assessment & Plan Note (Signed)
Is struggling with restless sleep and frequent leg movements despite using CPAP nightly, last study 10 years ago. Agrees to referral to neurology for consideration

## 2018-01-21 NOTE — Assessment & Plan Note (Signed)
Is noting increased congestion in head and pressure in ears. Encouraged to try Cetirizine once to twice daily. Flonase, nasal spray as needed report worsens

## 2018-01-21 NOTE — Assessment & Plan Note (Signed)
On Levothyroxine, continue to monitor 

## 2018-01-21 NOTE — Assessment & Plan Note (Signed)
Tolerating statin, encouraged heart healthy diet, avoid trans fats, minimize simple carbs and saturated fats. Increase exercise as tolerated 

## 2018-01-21 NOTE — Progress Notes (Signed)
Subjective:  I acted as a Neurosurgeonscribe for Dr. Abner GreenspanBlyth. Princess, [place]  Patient ID: Dustin PattenBrian Spears, male    DOB: Apr 15, 1966, 52 y.o.   MRN: [id number]  No chief complaint on file.   HPI  Patient is in today for a 6 month follow up and overall he is feeling well.no recent febrile illness or hospitalizations. Notes his right hi p has improved s/p treatment from Sports med and using the stretches he was given there. Continues to work and is trying to stay active uses his CPAP nightly but notes his wife is concerned about nightly prominent leg movements as he is sleeping. He denies any restless leg sensations when he is awake. Denies CP/palp/SOB/HA/congestion/fevers/GI or GU c/o. Taking meds as prescribed  Patient Care Team: Bradd CanaryBlyth, Stacey A, MD as PCP - General (Family Medicine)   Past Medical History:  Diagnosis Date  . ALLERGIC RHINITIS 03/12/2010  . Allergy    seasonal  . Bradycardia 01/11/2017  . FATIGUE 03/12/2010  . Headache(784.0) 03/12/2010  . History of chicken pox   . Hyperglycemia 07/10/2016  . Hyperlipidemia   . Knee pain, right   . Morbid obesity (HCC) 02/26/2011  . Morbid obesity (HCC) 02/26/2011  . Neck pain, acute 03/12/2011  . Overweight(278.02) 02/26/2011  . Preventative health care 11/04/2012  . RLS (restless legs syndrome) 01/08/2017  . SLEEP APNEA, OBSTRUCTIVE 04/24/2010  . SNORING 03/12/2010  . Sports physical 07/16/2012  . Tendonitis 05/19/2012  . Testosterone deficiency 10/19/2011  . Thyroid disease     Past Surgical History:  Procedure Laterality Date  . bicep tendon in left arm  2000    Family History  Problem Relation Age of Onset  . Hyperlipidemia Mother   . Lupus Mother   . Alcohol abuse Father        smoked  . Arthritis Father   . Lupus Paternal Grandmother   . Arthritis Maternal Grandmother   . Ulcers Maternal Grandmother   . Alzheimer's disease Maternal Grandfather     Social History   Socioeconomic History  . Marital status: Married    Spouse name: Not  on file  . Number of children: 1  . Years of education: Not on file  . Highest education level: Not on file  Occupational History  . 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 Occupation: [employer]  Social Needs  . Financial resource strain: Not on file  . Food insecurity:    Worry: Not on file    Inability: Not on file  . Transportation needs:    Medical: Not on file    Non-medical: Not on file  Tobacco Use  . Smoking status: Never Smoker  . Smokeless tobacco: Never Used  Substance and Sexual Activity  . Alcohol use: Yes    Comment: occasional  . Drug use: No  . Sexual activity: Yes  Lifestyle  . Physical activity:    Days per week: Not on file    Minutes per session: Not on file  . Stress: Not on file  Relationships  . Social connections:    Talks on phone: Not on file    Gets together: Not on file    Attends religious service: Not on file    Active member of club or organization: Not on file    Attends meetings of clubs or organizations: Not on file    Relationship status: Not on file  . Intimate partner violence:    Fear of current or  ex partner: Not on file    Emotionally abused: Not on file    Physically abused: Not on file    Forced sexual activity: Not on file  Other Topics Concern  . Not on file  Social History Narrative   Grew up in [place] to [employer] [place]    Outpatient Medications Prior to Visit  Medication Sig Dispense Refill  . Calcium Citrate-Vitamin D 200-250 MG-UNIT TABS Take 1 capsule by mouth daily. 120 tablet 0  . cetirizine (ZYRTEC) 10 MG tablet Take 10 mg by mouth daily. Reported on 01/03/2016    . diclofenac (VOLTAREN) 75 MG EC tablet TAKE 1 TABLET (75 MG TOTAL) BY MOUTH 2 (TWO) TIMES DAILY. 60 tablet 2  . fish oil-omega-3 fatty acids 1000 MG capsule Take 2 g by mouth daily.    Marland Kitchen levothyroxine (SYNTHROID, LEVOTHROID) 50 MCG tablet TAKE 1 TABLET BY MOUTH EVERY DAY 90 tablet 2  . Misc. Devices (NASADOCK) MISC by Does not apply  route.    . rosuvastatin (CRESTOR) 10 MG tablet TAKE 1 TABLET BY MOUTH EVERY DAY 90 tablet 1  . Testosterone 10 MG/ACT (2%) GEL APPLY 4 PUMPS ONTO THE SKIN ONCE DAILY 60 g 5  . traMADol (ULTRAM) 50 MG tablet TAKE 1 TABLET BY MOUTH EVERY 12 HOURS AS NEEDED 30 tablet 0  . Wheat Dextrin (BENEFIBER) POWD 2 tsp po daily in liquids 730 g 0   No facility-administered medications prior to visit.     No Known Allergies  Review of Systems  Constitutional: Negative for fever and malaise/fatigue.  HENT: Negative for congestion.   Eyes: Negative for blurred vision.  Respiratory: Negative for shortness of breath.   Cardiovascular: Negative for chest pain, palpitations and leg swelling.  Gastrointestinal: Negative for abdominal pain, blood in stool and nausea.  Genitourinary: Negative for dysuria and frequency.  Musculoskeletal: Negative for falls.  Skin: Negative for rash.  Neurological: Negative for dizziness, loss of consciousness and headaches.  Endo/Heme/Allergies: Negative for environmental allergies.  Psychiatric/Behavioral: Negative for depression. The patient has insomnia. The patient is not nervous/anxious.        Objective:    Physical Exam  Constitutional: He is oriented to person, place, and time. He appears well-developed and well-nourished. No distress.  HENT:  Head: Normocephalic and atraumatic.  Nose: Nose normal.  Eyes: Right eye exhibits no discharge. Left eye exhibits no discharge.  Neck: Normal range of motion. Neck supple.  Cardiovascular: Normal rate and regular rhythm.  No murmur heard. Pulmonary/Chest: Effort normal and breath sounds normal.  Abdominal: Soft. Bowel sounds are normal. There is no tenderness.  Musculoskeletal: He exhibits no edema.  Neurological: He is alert and oriented to person, place, and time.  Skin: Skin is warm and dry.  Psychiatric: He has a normal mood and affect.  Nursing note and vitals reviewed.   BP 112/68 (BP Location: Left Arm,  Patient Position: Sitting, Cuff Size: Large)   Pulse 75   Temp 98.1 F (36.7 C) (Oral)   Resp 18   Wt 246 lb (111.6 kg)   SpO2 98%   BMI 36.33 kg/m  Wt Readings from Last 3 Encounters:  01/21/18 246 lb (111.6 kg)  07/14/17 243 lb (110.2 kg)  03/09/17 244 lb 9.6 oz (110.9 kg)   BP Readings from Last 3 Encounters:  01/21/18 112/68  07/14/17 131/72  03/09/17 118/80     Immunization History  Administered Date(s) Administered  . Td 03/10/2000, 06/02/2006  . Tdap 11/04/2012  Health Maintenance  Topic Date Due  . HIV Screening  02/10/1980  . INFLUENZA VACCINE  05/20/2018  . Fecal DNA (Cologuard)  07/11/2019  . TETANUS/TDAP  11/04/2022    Lab Results  Component Value Date   WBC 7.1 07/14/2017   HGB 16.2 07/14/2017   HCT 48.8 07/14/2017   PLT 247.0 07/14/2017   GLUCOSE 83 07/14/2017   CHOL 180 07/14/2017   TRIG 192.0 (H) 07/14/2017   HDL 41.00 07/14/2017   LDLDIRECT 181.0 02/18/2016   LDLCALC 101 (H) 07/14/2017   ALT 26 07/14/2017   AST 22 07/14/2017   NA 137 07/14/2017   K 4.2 07/14/2017   CL 105 07/14/2017   CREATININE 0.94 07/14/2017   BUN 19 07/14/2017   CO2 25 07/14/2017   TSH 2.87 07/14/2017   PSA 0.64 05/16/2015   HGBA1C 5.8 07/14/2017    Lab Results  Component Value Date   TSH 2.87 07/14/2017   Lab Results  Component Value Date   WBC 7.1 07/14/2017   HGB 16.2 07/14/2017   HCT 48.8 07/14/2017   MCV 88.7 07/14/2017   PLT 247.0 07/14/2017   Lab Results  Component Value Date   NA 137 07/14/2017   K 4.2 07/14/2017   CO2 25 07/14/2017   GLUCOSE 83 07/14/2017   BUN 19 07/14/2017   CREATININE 0.94 07/14/2017   BILITOT 0.5 07/14/2017   ALKPHOS 31 (L) 07/14/2017   AST 22 07/14/2017   ALT 26 07/14/2017   PROT 7.4 07/14/2017   ALBUMIN 4.5 07/14/2017   CALCIUM 9.7 07/14/2017   GFR 89.42 07/14/2017   Lab Results  Component Value Date   CHOL 180 07/14/2017   Lab Results  Component Value Date   HDL 41.00 07/14/2017   Lab Results    Component Value Date   LDLCALC 101 (H) 07/14/2017   Lab Results  Component Value Date   TRIG 192.0 (H) 07/14/2017   Lab Results  Component Value Date   CHOLHDL 4 07/14/2017   Lab Results  Component Value Date   HGBA1C 5.8 07/14/2017         Assessment & Plan:   Problem List Items Addressed This Visit    Hypothyroidism    On Levothyroxine, continue to monitor      Relevant Orders   TSH   Obstructive sleep apnea    Is struggling with restless sleep and frequent leg movements despite using CPAP nightly, last study 10 years ago. Agrees to referral to neurology for consideration      Relevant Orders   Ambulatory referral to Neurology   Allergic rhinitis    Is noting increased congestion in head and pressure in ears. Encouraged to try Cetirizine once to twice daily. Flonase, nasal spray as needed report worsens      Hyperlipidemia    Tolerating statin, encouraged heart healthy diet, avoid trans fats, minimize simple carbs and saturated fats. Increase exercise as tolerated      Relevant Orders   Lipid panel   Hyperglycemia    hgba1c acceptable, minimize simple carbs. Increase exercise as tolerated.       Relevant Orders   Hemoglobin A1c   Comprehensive metabolic panel   Restless leg syndrome - Primary   Relevant Orders   Ambulatory referral to Neurology   CBC    Other Visit Diagnoses    Hypotestosteronemia       Relevant Orders   Testosterone      I am having Dustin Spears maintain his BENEFIBER, Calcium Citrate-Vitamin  D, fish oil-omega-3 fatty acids, NASADOCK, cetirizine, levothyroxine, Testosterone, rosuvastatin, traMADol, and diclofenac.  No orders of the defined types were placed in this encounter.   CMA served as Neurosurgeon during this visit. History, Physical and Plan performed by medical provider. Documentation and orders reviewed and attested to.  [name], MD

## 2018-01-22 LAB — CBC
HEMATOCRIT: 46.4 % (ref 39.0–52.0)
HEMOGLOBIN: 15.8 g/dL (ref 13.0–17.0)
MCHC: 34 g/dL (ref 30.0–36.0)
MCV: 88.5 fl (ref 78.0–100.0)
Platelets: 271 10*3/uL (ref 150.0–400.0)
RBC: 5.25 Mil/uL (ref 4.22–5.81)
RDW: 13.3 % (ref 11.5–15.5)
WBC: 7.4 10*3/uL (ref 4.0–10.5)

## 2018-01-22 LAB — COMPREHENSIVE METABOLIC PANEL
ALBUMIN: 4.4 g/dL (ref 3.5–5.2)
ALK PHOS: 31 U/L — AB (ref 39–117)
ALT: 42 U/L (ref 0–53)
AST: 31 U/L (ref 0–37)
BUN: 23 mg/dL (ref 6–23)
CALCIUM: 9.3 mg/dL (ref 8.4–10.5)
CO2: 23 mEq/L (ref 19–32)
Chloride: 106 mEq/L (ref 96–112)
Creatinine, Ser: 0.99 mg/dL (ref 0.40–1.50)
GFR: 84.06 mL/min (ref >60.00)
Glucose, Bld: 103 mg/dL — ABNORMAL HIGH (ref 70–99)
POTASSIUM: 3.9 meq/L (ref 3.5–5.1)
Sodium: 138 mEq/L (ref 135–145)
TOTAL PROTEIN: 7.4 g/dL (ref 6.0–8.3)
Total Bilirubin: 0.3 mg/dL (ref 0.2–1.2)

## 2018-01-22 LAB — TSH: TSH: 2.25 u[IU]/mL (ref 0.35–4.50)

## 2018-01-22 LAB — LIPID PANEL
Cholesterol: 161 mg/dL (ref 0–200)
HDL: 35 mg/dL — AB (ref >39.00)
NonHDL: 125.69
TRIGLYCERIDES: 256 mg/dL — AB (ref 0.0–149.0)
Total CHOL/HDL Ratio: 5
VLDL: 51.2 mg/dL — AB (ref 0.0–40.0)

## 2018-01-22 LAB — LDL CHOLESTEROL, DIRECT: Direct LDL: 99 mg/dL

## 2018-01-22 LAB — HEMOGLOBIN A1C: Hgb A1c MFr Bld: 5.8 % (ref 4.6–6.5)

## 2018-01-22 LAB — TESTOSTERONE: TESTOSTERONE: 511.48 ng/dL (ref 300.00–890.00)

## 2018-01-26 MED ORDER — ROSUVASTATIN CALCIUM 20 MG PO TABS: 20.0000 mg | ORAL_TABLET | Freq: Every day | ORAL | 1 refills | Status: DC

## 2018-01-26 NOTE — Addendum Note (Signed)
Addended by: Steve RattlerBLEVINS, Darica Goren A on: 01/26/2018 09:22 AM   Modules accepted: Orders

## 2018-01-28 ENCOUNTER — Other Ambulatory Visit: Payer: Self-pay | Admitting: Family Medicine

## 2018-01-28 NOTE — Telephone Encounter (Signed)
Requesting: Testosterone 10  Contract: 02/18/17 UDS: 08/21/17 Low Risk Last OV:  01/21/18 Next OV:  07/30/18 Last Refill: 07/14/17  60g   Please advise

## 2018-02-08 DIAGNOSIS — G4733 Obstructive sleep apnea (adult) (pediatric): Secondary | ICD-10-CM | POA: Diagnosis not present

## 2018-02-26 DIAGNOSIS — G4733 Obstructive sleep apnea (adult) (pediatric): Secondary | ICD-10-CM | POA: Diagnosis not present

## 2018-03-17 ENCOUNTER — Ambulatory Visit (INDEPENDENT_AMBULATORY_CARE_PROVIDER_SITE_OTHER): Payer: BLUE CROSS/BLUE SHIELD | Admitting: Neurology

## 2018-03-17 ENCOUNTER — Encounter: Payer: Self-pay | Admitting: Neurology

## 2018-03-17 VITALS — BP 158/99 | HR 81 | Ht 69.0 in | Wt 248.0 lb

## 2018-03-17 DIAGNOSIS — G4761 Periodic limb movement disorder: Secondary | ICD-10-CM

## 2018-03-17 DIAGNOSIS — G4719 Other hypersomnia: Secondary | ICD-10-CM

## 2018-03-17 DIAGNOSIS — G4733 Obstructive sleep apnea (adult) (pediatric): Secondary | ICD-10-CM | POA: Diagnosis not present

## 2018-03-17 DIAGNOSIS — E669 Obesity, unspecified: Secondary | ICD-10-CM

## 2018-03-17 DIAGNOSIS — R635 Abnormal weight gain: Secondary | ICD-10-CM | POA: Diagnosis not present

## 2018-03-17 DIAGNOSIS — Z9989 Dependence on other enabling machines and devices: Secondary | ICD-10-CM | POA: Diagnosis not present

## 2018-03-17 DIAGNOSIS — G4762 Sleep related leg cramps: Secondary | ICD-10-CM | POA: Diagnosis not present

## 2018-03-17 NOTE — Patient Instructions (Addendum)
Thank you for choosing [facility] for your sleep related care! It was nice to meet you today! I appreciate that you entrust me with your sleep related healthcare concerns. I hope, I was able to address at least some of your concerns today, and that I can help you feel reassured and also get better.    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 While this is typically associated with symptoms of restless leg syndrome, patients can have periodic leg movements, that is, repetitive twitching in their sleep secondary to medication effects, particularly from taking an SSRI type antidepressant. Sometimes changing the antidepressant regimen can help. If this leg movement disorder disrupts your sleep, it can cause other problems such as daytime sleepiness. Please do not drive if you feels sleepy. Sometimes other conditions can cause leg twitching at night including thyroid dysfunction and iron deficiency. There are some symptomatic medications available for treatment. At this point, we should proceed with a sleep study to further delineate your sleep-related twitching.   Based on your symptoms and your exam I believe you are still at risk for obstructive sleep apnea and would benefit from reevaluation as it has been many years and you need new supplies and updated machine. Therefore, I think we should proceed with a sleep study to determine how severe your sleep apnea is. If you have more than mild OSA, I want you to consider ongoing treatment with CPAP. Please remember, the risks and ramifications of moderate to severe obstructive sleep apnea or OSA are: Cardiovascular disease, including congestive heart failure, stroke, difficult to control hypertension, arrhythmias, and even type 2 diabetes has been linked to untreated OSA. Sleep apnea causes  disruption of sleep and sleep deprivation in most cases, which, in turn, can cause recurrent headaches, problems with memory, mood, concentration, focus, and vigilance. Most people with untreated sleep apnea report excessive daytime sleepiness, which can affect their ability to drive. Please do not drive if you feel sleepy.   I will likely see you back after your sleep study to go over the test results and where to go from there. We will call you after your sleep study to advise about the results (most likely, you will hear from [place], my nurse) and to set up an appointment at the time, as necessary.    Our sleep lab administrative assistant will call you to schedule your sleep study. If you don't hear back from her by about 2 weeks from now, please feel free to call her at [phone]. You can leave a message with your phone number and concerns, if you get the voicemail box. She will call back as soon as possible.

## 2018-03-17 NOTE — Progress Notes (Signed)
Subjective:    Patient ID: Dustin Spears is a 52 y.o. male.  HPI     Huston Foley, MD, PhD [facility] [address], [address] [address] [place], [place] [address]  Dear Dr. Abner Greenspan,   I saw your patient, Dustin Spears, upon your kind request, in my neurologic clinic today for initial consultation of his sleep disorder, in particular, reevaluation of his prior diagnosis of obstructive sleep apnea and history of restless leg syndrome. The patient is unaccompanied today. As you know, Dustin Spears is a 52 year old right-handed gentleman with an underlying medical history of allergic rhinitis, hyperlipidemia, restless leg syndrome, thyroid disease, low testosterone, neck pain and obesity, who was previously diagnosed with obstructive sleep apnea and placed on CPAP therapy. He reports being compliant with his CPAP machine. He had prior sleep study testing in 2011. He had a split-night sleep study at [facility] on 04/09/2010 and I reviewed the results, study was interpreted by Dr.  Shelle Iron at the time. His overall AHI was 17 per hour, oxygen nadir was 85%. He was fitted with a nasal mask and CPAP was titrated to a final pressure of 8 cm. His BMI was listed as 34 at the time, weight at 230 pounds. He did not have any significant PLMS during the study. His machine is older, nearly 53 years old. A CPAP download report was reviewed today: I reviewed his compliance data from 02/15/2018 through 03/16/2018 which is a total of 30 days, during which time he was fully compliant with usage with percent used days greater than 4 hours at 100%, indicating super compliance, average usage of 7 hours and 21 minutes, residual AHI and leak information unfortunately is not available through the card on the machine. Pressure is at 8 cm. I reviewed your office note from 01/21/2018. He has a diagnosis of restless leg syndrome and while he does not endorse much in the way of RLS symptoms at this time, he   has leg twitching and occasional arm twitching during sleep as reported by his wife. Symptoms are intermittent, not every night, started about a year ago or so as noticed by his wife primarily. His Epworth sleepiness score is 9 out of 24 today, fatigue score is 22 out of 63. He is married and lives with his wife, they have 1 child. He is a nonsmoker and drinks alcohol maybe twice a week, caffeine in the form of coffee, 4 cups per day on average.  He does not have a history of thyroid dysfunction or anemia or iron deficiency. He is not on an antidepressant. He recently stopped his Crestor as he had muscle aching when the dose was increased to 20 mg, he is going to restart at 10 mg strength. His leg movements are described as wrapping his feet against each other while he is asleep. He is not aware of these movements or twitching. They are somewhat bothersome to his wife when she is trying to fall asleep but once she is asleep his movements don't bother her, he is not bothered by his symptoms, he does not endorse any actual symptoms of restless leg syndrome, is not aware of any family history of RLS. He does snore some through his mask, he has had some weight gain over the past few years. Compared to 2011 he has gained about 18 pounds. His bedtime is between 10:30 and 11 PM, rise time around 5:40 AM. He has no nighttime nocturia or morning headaches.  His Past  Medical History Is Significant For: Past Medical History:  Diagnosis Date  . ALLERGIC RHINITIS 03/12/2010  . Allergy    seasonal  . Bradycardia 01/11/2017  . FATIGUE 03/12/2010  . Headache(784.0) 03/12/2010  . History of chicken pox   . Hyperglycemia 07/10/2016  . Hyperlipidemia   . Knee pain, right   . Morbid obesity (HCC) 02/26/2011  . Morbid obesity (HCC) 02/26/2011  . Neck pain, acute 03/12/2011  . Overweight(278.02) 02/26/2011  . Preventative health care 11/04/2012  . RLS (restless legs syndrome) 01/08/2017  . SLEEP APNEA, OBSTRUCTIVE 04/24/2010  .  SNORING 03/12/2010  . Sports physical 07/16/2012  . Tendonitis 05/19/2012  . Testosterone deficiency 10/19/2011  . Thyroid disease     His Past Surgical History Is Significant For: Past Surgical History:  Procedure Laterality Date  . bicep tendon in left arm  2000    His Family History Is Significant For: Family History  Problem Relation Age of Onset  . Hyperlipidemia Mother   . Lupus Mother   . Alcohol abuse Father        smoked  . Arthritis Father   . Lupus Paternal Grandmother   . Arthritis Maternal Grandmother   . Ulcers Maternal Grandmother   . Alzheimer's disease Maternal Grandfather     His Social History Is Significant For: Social History   Socioeconomic History  . Marital status: Married    Spouse name: Not on file  . Number of children: 1  . Years of education: Not on file  . Highest education level: Not on file  Occupational History  . 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 Occupation: [employer]  Social Needs  . Financial resource strain: Not on file  . Food insecurity:    Worry: Not on file    Inability: Not on file  . Transportation needs:    Medical: Not on file    Non-medical: Not on file  Tobacco Use  . Smoking status: Never Smoker  . Smokeless tobacco: Never Used  Substance and Sexual Activity  . Alcohol use: Yes    Comment: occasional  . Drug use: No  . Sexual activity: Yes  Lifestyle  . Physical activity:    Days per week: Not on file    Minutes per session: Not on file  . Stress: Not on file  Relationships  . Social connections:    Talks on phone: Not on file    Gets together: Not on file    Attends religious service: Not on file    Active member of club or organization: Not on file    Attends meetings of clubs or organizations: Not on file    Relationship status: Not on file  Other Topics Concern  . Not on file  Social History Narrative   Grew up in [place] to [employer] 1991    His Allergies Are:  No  Known Allergies:   His Current Medications Are:  Outpatient Encounter Medications as of 03/17/2018  Medication Sig  . Calcium Citrate-Vitamin D 200-250 MG-UNIT TABS Take 1 capsule by mouth daily.  . cetirizine (ZYRTEC) 10 MG tablet Take 10 mg by mouth daily. Reported on 01/03/2016  . diclofenac (VOLTAREN) 75 MG EC tablet TAKE 1 TABLET (75 MG TOTAL) BY MOUTH 2 (TWO) TIMES DAILY.  . fish oil-omega-3 fatty acids 1000 MG capsule Take 2 g by mouth daily.  Marland Kitchen levothyroxine (SYNTHROID, LEVOTHROID) 50 MCG tablet TAKE 1 TABLET BY MOUTH EVERY  DAY  . Misc. Devices (NASADOCK) MISC by Does not apply route.  . rosuvastatin (CRESTOR) 20 MG tablet Take 1 tablet (20 mg total) by mouth daily. Take one tablet by mouth daily at bedtime.  . Testosterone 10 MG/ACT (2%) GEL APPLY 4 PUMPS ONTO THE SKIN ONCE DAILY  . traMADol (ULTRAM) 50 MG tablet TAKE 1 TABLET BY MOUTH EVERY 12 HOURS AS NEEDED  . Wheat Dextrin (BENEFIBER) POWD 2 tsp po daily in liquids   No facility-administered encounter medications on file as of 03/17/2018.   :  Review of Systems:  Out of a complete 14 point review of systems, all are reviewed and negative with the exception of these symptoms as listed below: Review of Systems  Neurological:       Pt presents today to discuss his restless legs. Pt says that his wife notices that his arms and legs move around at night. Pt uses a cpap that is 32-69 years old. Pt uses [facility].  Epworth Sleepiness Scale 0= would never doze 1= slight chance of dozing 2= moderate chance of dozing 3= high chance of dozing  Sitting and reading: 1 Watching TV: 2 Sitting inactive in a public place (ex. Theater or meeting): 1 As a passenger in a car for an hour without a break: 1 Lying down to rest in the afternoon: 3 Sitting and talking to someone: 0 Sitting quietly after lunch (no alcohol): 1 In a car, while stopped in traffic: 0 Total: 9     Objective:  Neurological Exam  Physical Exam Physical Examination:    Vitals:   03/17/18 1529  BP: (!) 158/99  Pulse: 81    General Examination: The patient is a very pleasant 52 y.o. male in no acute distress. He appears well-developed and well-nourished and well groomed.   HEENT: Normocephalic, atraumatic, pupils are equal, round and reactive to light and accommodation. Corrective eye glasses in place. Extraocular tracking is good without limitation to gaze excursion or nystagmus noted. Normal smooth pursuit is noted. Hearing is grossly intact. Face is symmetric with normal facial animation and normal facial sensation. Speech is clear with no dysarthria noted. There is no hypophonia. There is no lip, neck/head, jaw or voice tremor. Neck is supple with full range of passive and active motion. There are no carotid bruits on auscultation. Oropharynx exam reveals: mild mouth dryness, adequate dental hygiene and moderate airway crowding, due to tonsils are about 1-2+ bilaterally, thicker uvula, Mallampati is class I. Neck circumference is 18-3/4 inches. Tongue protrudes centrally and palate elevates symmetrically.   Chest: Clear to auscultation without wheezing, rhonchi or crackles noted.  Heart: S1+S2+0, regular and normal without murmurs, rubs or gallops noted.   Abdomen: Soft, non-tender and non-distended with normal bowel sounds appreciated on auscultation.  Extremities: There is no pitting edema in the distal lower extremities bilaterally. Pedal pulses are intact.  Skin: Warm and dry without trophic changes noted.  Musculoskeletal: exam reveals no obvious joint deformities, tenderness or joint swelling or erythema. Some discomfort in the right shoulder reported and right knee.  Neurologically:  Mental status: The patient is awake, alert and oriented in all 4 spheres. His immediate and remote memory, attention, language skills and fund of knowledge are appropriate. There is no evidence of aphasia, agnosia, apraxia or anomia. Speech is clear with normal  prosody and enunciation. Thought process is linear. Mood is normal and affect is normal.  Cranial nerves II - XII are as described above under HEENT exam. In addition: shoulder  shrug is normal with equal shoulder height noted. Motor exam: Normal bulk, strength and tone is noted. There is no drift, tremor or rebound. Romberg is negative. Reflexes are 2+ throughout. Fine motor skills and coordination: intact with normal finger taps, normal hand movements, normal rapid alternating patting, normal foot taps and normal foot agility.  Cerebellar testing: No dysmetria or intention tremor. There is no truncal or gait ataxia.  Sensory exam: intact to light touch in the upper and lower extremities.  Gait, station and balance: He stands easily. No veering to one side is noted. No leaning to one side is noted. Posture is age-appropriate and stance is narrow based. Gait shows normal stride length and normal pace. No problems turning are noted. Tandem walk is unremarkable.   Assessment and Plan:  In summary, Jw Covin is a very pleasant 52 y.o.-year old male with an underlying medical history of allergic rhinitis, hyperlipidemia, restless leg syndrome, thyroid disease, low testosterone, neck pain and obesity, who was diagnosed with moderate obstructive sleep apnea in 2011. He has been on CPAP of 8 cm. He indicates full compliance and recent compliance data suggests full compliance but not how well his sleep apnea is treated based on no data available on his residual AHI or leak from the mask. He has experienced in the past year intermittent leg twitching, does not actually endorses restless leg symptoms. He has had interim weight gain since his original sleep apnea diagnosis. He would benefit from reevaluation. An attended sleep study is indicated as we would be able to monitor for periodic leg movements. He may have PLMD in the absence of restless leg syndrome.he is currently not very bothered by his leg movements or  arm twitching we will forego any indication management for now. I would like for him to have reevaluation of his sleep apnea and hopefully optimalization of his treatment first. It is possible that he has residual sleep apnea and his leg movements are part of arousals from sleep disordered breathing.  I explained the sleep test procedure to the patient, he indicated that that would be willing to use CPAP ongoing. I explained the importance of being compliant with PAP treatment, not only for insurance purposes but primarily to improve His symptoms, and for the patient's long term health benefit, including to reduce His cardiovascular risks. I answered all his questions today and the patient was in agreement. I would like to see him back after the sleep study is completed and encouraged him to call with any interim questions, concerns, problems or updates.   Thank you very much for allowing me to participate in the care of this nice patient. If I can be of any further assistance to you please do not hesitate to call me at [phone].  Sincerely,   Huston Foley, MD, PhD

## 2018-03-21 ENCOUNTER — Other Ambulatory Visit: Payer: Self-pay | Admitting: Family Medicine

## 2018-04-05 ENCOUNTER — Ambulatory Visit (INDEPENDENT_AMBULATORY_CARE_PROVIDER_SITE_OTHER): Payer: BLUE CROSS/BLUE SHIELD | Admitting: Neurology

## 2018-04-05 DIAGNOSIS — G4719 Other hypersomnia: Secondary | ICD-10-CM

## 2018-04-05 DIAGNOSIS — R635 Abnormal weight gain: Secondary | ICD-10-CM

## 2018-04-05 DIAGNOSIS — G4733 Obstructive sleep apnea (adult) (pediatric): Secondary | ICD-10-CM | POA: Diagnosis not present

## 2018-04-05 DIAGNOSIS — Z9989 Dependence on other enabling machines and devices: Secondary | ICD-10-CM

## 2018-04-05 DIAGNOSIS — G4761 Periodic limb movement disorder: Secondary | ICD-10-CM

## 2018-04-05 DIAGNOSIS — E669 Obesity, unspecified: Secondary | ICD-10-CM

## 2018-04-06 NOTE — Progress Notes (Signed)
Patient referred by Dr. Abner GreenspanBlyth, seen by me on 03/17/18 for re-eval of OSA, split night sleep study on 04/05/18. Please call and notify patient that the recent sleep study confirmed the diagnosis of severe OSA. He did well with CPAP during the study with significant improvement of the respiratory events. I would like to prescribe a new CPAP machine at a pressure of 9 cm, he can use the mask he has had. He has a DME company.  Please advise patient that we need a follow up appointment with either myself or one of our nurse practitioners in about 10 weeks post set-up to check for how the patient is feeling and how well the patient is using the machine, etc. Please go ahead and schedule the appointment, while you have the patient on the phone and make sure patient understands the importance of keeping this window for the FU appointment, as it is often an insurance requirement. Failing to adhere to this may result in losing coverage for sleep apnea treatment, at which point most patients are left with a choice of returning the machine or paying out of pocket (and we want neither of this to happen!).  Please re-enforce the importance of compliance with treatment and the need for us to monitor compliance data - again an insurance requirement and usually a good feedback for the patient as far as how they are doing.  Also remind patient, that any PAP machine or mask issues should be first addressed with the DME company, who provided the machine/mask.  Please ask if patient has a preference regarding DME company, may depend on the insurance too.  Please arrange for CPAP set up at home through a DME company of patient's choice.  Once you have spoken to the patient you can close the phone encounter. Please fax/route report to referring provider, thanks,   Huston FoleySaima Harrison Paulson, MD, PhD [facility]

## 2018-04-06 NOTE — Procedures (Signed)
PATIENT'S NAME:  Dustin Spears, Dustin Spears DOB:      Aug 27, 1966      MR#:    [id number]     DATE OF RECORDING: 04/05/2018 REFERRING M.D.:  [name], MD Study Performed:  Split-Night Titration Study HISTORY: 52 year old right-handed gentleman with an underlying medical history of allergic rhinitis, hyperlipidemia, restless leg syndrome, thyroid disease, low testosterone, neck pain and obesity, who was previously diagnosed with obstructive sleep apnea and placed on CPAP therapy. He presents for re-evaluation and optimization of treatment. The patient endorsed the Epworth Sleepiness Scale at 9/24 points. The patient's weight 248 pounds with a height of 69 (inches), resulting in a BMI of 36.6 kg/m2. The patient's neck circumference measured 18.8 inches.  CURRENT MEDICATIONS: Vitamin D, Zyrtec, Voltaren, Omega-3, Synthroid, Nasadock, Crestor, Testosterone, Ultram, Benefiber  PROCEDURE:  This is a multichannel digital polysomnogram utilizing the Somnostar 11.2 system.  Electrodes and sensors were applied and monitored per AASM Specifications.   EEG, EOG, Chin and Limb EMG, were sampled at 200 Hz.  ECG, Snore and Nasal Pressure, Thermal Airflow, Respiratory Effort, CPAP Flow and Pressure, Oximetry was sampled at 50 Hz. Digital video and audio were recorded.      BASELINE STUDY WITHOUT CPAP RESULTS:  Lights Out was at 22:22 and Lights On at 05:00 for the night, split study started at 00:38, epoch 275. Total recording time (TRT) was 139.5, with a total sleep time (TST) of 123.5 minutes. The patient's sleep latency was 12.5 minutes. REM sleep was absent. The sleep efficiency was 88.5 %.    SLEEP ARCHITECTURE: WASO (Wake after sleep onset) was 5 minutes, Stage N1 was 1 minutes, Stage N2 was 122.5 minutes, Stage N3 was 0 minutes and Stage R (REM sleep) was 0 minutes.  The percentages were Stage N1 .8%, Stage N2 99.2%, which is increased, Stage N3 and Stage R (REM sleep) were absent. The arousals were noted as: 13 were  spontaneous, 58 were associated with PLMs, 82 were associated with respiratory events.  RESPIRATORY ANALYSIS:  There were a total of 87 respiratory events:  11 obstructive apneas, 0 central apneas and 1 mixed apneas with a total of 12 apneas and an apnea index (AI) of 5.8. There were 75 hypopneas with a hypopnea index of 36.4. The patient also had 0 respiratory event related arousals (RERAs).  Snoring was noted.     The total APNEA/HYPOPNEA INDEX (AHI) was 42.3 /hour and the total RESPIRATORY DISTURBANCE INDEX was 42.3 /hour.  0 events occurred in REM sleep and 151 events in NREM. The REM AHI was 0, /hour versus a non-REM AHI of 42.3 /hour. The patient spent 131.5 minutes sleep time in the supine position 236 minutes in non-supine. The supine AHI was 87.2 /hour versus a non-supine AHI of 40.2 /hour.  OXYGEN SATURATION & C02:  The wake baseline 02 saturation was 92%, with the lowest being 73%. Time spent below 89% saturation equaled 19 minutes. PERIODIC LIMB MOVEMENTS: The patient had a total of 150 Periodic Limb Movements. The Periodic Limb Movement (PLM) index was 72.9 /hour and the PLM Arousal index was 28.2 /hour.  Audio and video analysis did not show any abnormal or unusual movements, behaviors, phonations or vocalizations. The patient took no bathroom breaks for the night. Snoring was noted. The EKG was in keeping with normal sinus rhythm (NSR).  TITRATION STUDY WITH CPAP RESULTS:   The patient used his own Airtouch FFM for the treatment portion of the study. CPAP was initiated at 5 cmH20 with heated  humidity per AASM split night standards and pressure was advanced to 8 cmH20 because of hypopneas, apneas and desaturations.  At a PAP pressure of 8 cmH20, there was a reduction of the AHI to 0.4/hour with supine REM sleep achieved and O2 nadir of 88%.   Total recording time (TRT) was 259.5 minutes, with a total sleep time (TST) of 244 minutes. The patient's sleep latency was 9 minutes. REM latency  was 57.5 minutes.  The sleep efficiency was 94. %.    SLEEP ARCHITECTURE: Wake after sleep was 6 minutes, Stage N1 2.5 minutes, Stage N2 151.5 minutes, Stage N3 56.5 minutes and Stage R (REM sleep) 33.5 minutes. The percentages were: Stage N1 1.%, Stage N2 62.1%, Stage N3 23.2% and Stage R (REM sleep) 13.7%. The arousals were noted as: 48 were spontaneous, 19 were associated with PLMs, 17 were associated with respiratory events.  RESPIRATORY ANALYSIS:  There were a total of 21 respiratory events: 1 obstructive apneas, 0 central apneas and 0 mixed apneas with a total of 1 apneas and an apnea index (AI) of .2. There were 20 hypopneas with a hypopnea index of 4.9 /hour. The patient also had 0 respiratory event related arousals (RERAs).      The total APNEA/HYPOPNEA INDEX  (AHI) was 5.2 /hour and the total RESPIRATORY DISTURBANCE INDEX was 5.2 /hour.  2 events occurred in REM sleep and 19 events in NREM. The REM AHI was 3.6 /hour versus a non-REM AHI of 5.4 /hour. REM sleep was achieved on a pressure of  cm/h2o (AHI was  .) The patient spent 52% of total sleep time in the supine position. The supine AHI was 10.0 /hour, versus a non-supine AHI of 0.0/hour.  OXYGEN SATURATION & C02:  The wake baseline 02 saturation was 94%, with the lowest being 87%. Time spent below 89% saturation equaled 8 minutes.  PERIODIC LIMB MOVEMENTS: The patient had a total of 34 Periodic Limb Movements. The Periodic Limb Movement (PLM) index was 8.4 /hour and the PLM Arousal index was 4.7 /hour.  Post-study, the patient indicated that sleep was worse than usual.  POLYSOMNOGRAPHY IMPRESSION :   1. Obstructive Sleep Apnea (OSA)  2. Periodic Limb Movement Disorder (PLMD)  RECOMMENDATIONS:  1. This patient has severe obstructive sleep apnea and responded well on CPAP therapy. Given the O2 nadir of 88% on the final titration pressure of 8 cm, I would recommend a home CPAP treatment pressure of 9 cm via medium FFM of choice with  heated humidity. The patient should qualify for new equipment. The patient should be reminded to be fully compliant with PAP therapy to improve sleep related symptoms and decrease long term cardiovascular risks. Please note that untreated obstructive sleep apnea may carry additional perioperative morbidity. Patients with significant obstructive sleep apnea should receive perioperative PAP therapy and the surgeons and particularly the anesthesiologist should be informed of the diagnosis and the severity of the sleep disordered breathing. 2. Severe PLMs (periodic limb movements of sleep) were noted during the baseline portion of the study only without moderate arousals; clinical correlation is recommended. 3. The patient should be cautioned not to drive, work at heights, or operate dangerous or heavy equipment when tired or sleepy. Review and reiteration of good sleep hygiene measures should be pursued with any patient. 4. The patient will be seen in follow-up in the sleep clinic at [facility] for discussion of the test results, symptom and treatment compliance review, further management strategies, etc. The referring provider will be notified  of the test results.  I certify that I have reviewed the entire raw data recording prior to the issuance of this report in accordance with the Standards of Accreditation of the American Academy of Sleep Medicine (AASM)  Huston FoleySaima Alyssa Rotondo, MD, PhD Diplomat, American Board of Neurology and Sleep Medicine (Neurology and Sleep Medicine)

## 2018-04-06 NOTE — Addendum Note (Signed)
Addended by: Huston FoleyATHAR, Abelardo Seidner on: 04/06/2018 01:19 PM   Modules accepted: Orders

## 2018-04-07 ENCOUNTER — Telehealth: Payer: Self-pay

## 2018-04-07 NOTE — Telephone Encounter (Signed)
I called pt to discuss his sleep study results. No answer, left a message asking him to call me back. 

## 2018-04-07 NOTE — Telephone Encounter (Signed)
-----   Message from Huston FoleySaima Athar, MD sent at 04/06/2018  1:19 PM EDT ----- Patient referred by Dr. Abner GreenspanBlyth, seen by me on 03/17/18 for re-eval of OSA, split night sleep study on 04/05/18. Please call and notify patient that the recent sleep study confirmed the diagnosis of severe OSA. He did well with CPAP during the study with significant improvement of the respiratory events. I would like to prescribe a new CPAP machine at a pressure of 9 cm, he can use the mask he has had. He has a DME company.  Please advise patient that we need a follow up appointment with either myself or one of our nurse practitioners in about 10 weeks post set-up to check for how the patient is feeling and how well the patient is using the machine, etc. Please go ahead and schedule the appointment, while you have the patient on the phone and make sure patient understands the importance of keeping this window for the FU appointment, as it is often an insurance requirement. Failing to adhere to this may result in losing coverage for sleep apnea treatment, at which point most patients are left with a choice of returning the machine or paying out of pocket (and we want neither of this to happen!).  Please re-enforce the importance of compliance with treatment and the need for us to monitor compliance data - again an insurance requirement and usually a good feedback for the patient as far as how they are doing.  Also remind patient, that any PAP machine or mask issues should be first addressed with the DME company, who provided the machine/mask.  Please ask if patient has a preference regarding DME company, may depend on the insurance too.  Please arrange for CPAP set up at home through a DME company of patient's choice.  Once you have spoken to the patient you can close the phone encounter. Please fax/route report to referring provider, thanks,   Huston FoleySaima Athar, MD, PhD [facility])

## 2018-04-08 NOTE — Telephone Encounter (Signed)
I called pt to discuss his sleep study results. No answer, left another message asking him to call me back.

## 2018-04-13 NOTE — Telephone Encounter (Signed)
I called pt. I advised pt that Dr. Frances FurbishAthar reviewed their sleep study results and found that pt has severe osa but that pt did well with the cpap during his latest sleep study. Dr. Frances FurbishAthar recommends that pt start a cpap at home. I reviewed PAP compliance expectations with the pt. Pt is agreeable to starting a CPAP. I advised pt that an order will be sent to a DME, [facility], and [facility] will call the pt within about one week after they file with the pt's insurance. [facility] will show the pt how to use the machine, fit for masks, and troubleshoot the CPAP if needed. A follow up appt was made for insurance purposes with Dr. Frances FurbishAthar on 07/14/18 at 2:00pm. Pt verbalized understanding to arrive 15 minutes early and bring their CPAP. A letter with all of this information in it will be sent to the pt's mychart account as a reminder. I verified with the pt that the address we have on file is correct. Pt verbalized understanding of results. Pt had no questions at this time but was encouraged to call back if questions arise.

## 2018-04-13 NOTE — Telephone Encounter (Signed)
I called pt again to discuss his sleep study results. No answer, left a message asking him to call me back. This is my third unsuccessful attempt at reaching pt by phone. Will send him a [employer] message.

## 2018-04-21 DIAGNOSIS — S63612D Unspecified sprain of right middle finger, subsequent encounter: Secondary | ICD-10-CM | POA: Diagnosis not present

## 2018-04-23 DIAGNOSIS — G4733 Obstructive sleep apnea (adult) (pediatric): Secondary | ICD-10-CM | POA: Diagnosis not present

## 2018-04-27 DIAGNOSIS — M79641 Pain in right hand: Secondary | ICD-10-CM | POA: Diagnosis not present

## 2018-05-12 DIAGNOSIS — M79641 Pain in right hand: Secondary | ICD-10-CM | POA: Diagnosis not present

## 2018-05-19 ENCOUNTER — Encounter: Payer: Self-pay | Admitting: Neurology

## 2018-05-20 DIAGNOSIS — G4733 Obstructive sleep apnea (adult) (pediatric): Secondary | ICD-10-CM | POA: Diagnosis not present

## 2018-05-21 DIAGNOSIS — G4733 Obstructive sleep apnea (adult) (pediatric): Secondary | ICD-10-CM | POA: Diagnosis not present

## 2018-06-06 ENCOUNTER — Other Ambulatory Visit: Payer: Self-pay | Admitting: Family Medicine

## 2018-06-08 ENCOUNTER — Other Ambulatory Visit: Payer: Self-pay

## 2018-06-08 MED ORDER — TESTOSTERONE 10 MG/ACT (2%) TD GEL: TRANSDERMAL | 3 refills | Status: DC

## 2018-06-08 NOTE — Telephone Encounter (Addendum)
Received refill request for testosterone 10 mg/act (2%) gel.  Last office visit: 01/21/18 Last refill: 01/28/18  Last CSC signed: 02/18/17 Last UDS: 02/18/2017

## 2018-06-08 NOTE — Telephone Encounter (Signed)
Sent to provider for approval

## 2018-06-09 DIAGNOSIS — M79641 Pain in right hand: Secondary | ICD-10-CM | POA: Diagnosis not present

## 2018-06-24 DIAGNOSIS — G4733 Obstructive sleep apnea (adult) (pediatric): Secondary | ICD-10-CM | POA: Diagnosis not present

## 2018-07-13 ENCOUNTER — Encounter: Payer: Self-pay | Admitting: Neurology

## 2018-07-14 ENCOUNTER — Encounter: Payer: Self-pay | Admitting: Neurology

## 2018-07-14 ENCOUNTER — Ambulatory Visit: Payer: BLUE CROSS/BLUE SHIELD | Admitting: Neurology

## 2018-07-14 VITALS — BP 151/94 | HR 61 | Ht 69.0 in | Wt 248.0 lb

## 2018-07-14 DIAGNOSIS — G4733 Obstructive sleep apnea (adult) (pediatric): Secondary | ICD-10-CM | POA: Diagnosis not present

## 2018-07-14 DIAGNOSIS — Z9989 Dependence on other enabling machines and devices: Secondary | ICD-10-CM

## 2018-07-14 NOTE — Patient Instructions (Addendum)

## 2018-07-14 NOTE — Progress Notes (Signed)
Subjective:    Patient ID: Dustin Spears is a 52 y.o. male.  HPI     Interim history:   Dustin Spears is a 52 year old right-handed gentleman with an underlying medical history of allergic rhinitis, hyperlipidemia, restless leg syndrome, thyroid disease, low testosterone, neck pain and obesity, who presents for follow-up consultation of his obstructive sleep apnea after reevaluation with a sleep study. The patient is unaccompanied today. I first met him on 03/17/2018 at the request of his primary care physician, at which time he reported a prior diagnosis of obstructive sleep apnea. He needed new equipment and reevaluation after several years. He had some restless leg symptoms. He had a split-night sleep study in the interim on 04/05/2018. I went over his test results with him in detail today. Baseline sleep efficiency was 88.5%, he had absence of REM sleep. Total AHI was in the severe range at 42.3 per hour, average oxygen saturation 92%, nadir was 73%. He was titrated on CPAP with a full facemask. On the final titration pressure of 8 cm his AHI was 0.4 per hour, with supine REM sleep achieved an O2 nadir of 88%. I suggested a home treatment pressure of 9 cm. 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   Today, 07/14/2018: I reviewed his CPAP compliance data from 06/14/2018 through 07/13/2018 which is a total of 30 days, during which time he used his CPAP every night with percent used days greater than 4 hours at 100%, indicating superb compliance with an average usage of 7 hours and 24 minutes, residual AHI at goal at 1.5 per hour, leak on the high side with the 95th percentile at 39 L/m on a pressure of 9 cm. He reports that his new machine is working well. It is quieter than his old machine. He has noticed a little bit of mask leak issues. Attributes this to his full beard. He had been out of work on short-term  disability after an injury to his right hand. He is been back to work for the last month. When he was at home out of work he was sleeping more. Overall, things are going well with his CPAP, he has no new complaints, does not mind the pressure of 9 cm. Has been using a fullface mask and is used to it. He has had some weight gain over the past 3 years. He is motivated to work on weight loss.  The patient's allergies, current medications, family history, past medical history, past social history, past surgical history and problem list were reviewed and updated as appropriate.   Previously (copied from previous notes for reference):   03/17/2018: (He) was previously diagnosed with obstructive sleep apnea and placed on CPAP therapy. He reports being compliant with his CPAP machine. He had prior sleep study testing in 2011. He had a split-night sleep study at [facility] on 04/09/2010 and I reviewed the results, study was interpreted by Dr. Gwenette Greet at the time. His overall AHI was 17 per hour, oxygen nadir was 85%. He was fitted with a nasal mask and CPAP was titrated to a final pressure of 8 cm. His BMI was listed as 34 at the time, weight at 230 pounds. He did not have any significant PLMS during the study. His machine is older, nearly 53 years old. A CPAP download report was reviewed today: I reviewed his compliance data from 02/15/2018 through 03/16/2018 which  is a total of 30 days, during which time he was fully compliant with usage with percent used days greater than 4 hours at 100%, indicating superb compliance, average usage of 7 hours and 21 minutes, residual AHI and leak information unfortunately is not available through the card on the machine. Pressure is at 8 cm. I reviewed your office note from 01/21/2018. He has a diagnosis of restless leg syndrome and while he does not endorse much in the way of RLS symptoms at this time, he  has leg twitching and occasional arm twitching during sleep as  reported by his wife. Symptoms are intermittent, not every night, started about a year ago or so as noticed by his wife primarily. His Epworth sleepiness score is 9 out of 24 today, fatigue score is 22 out of 63. He is married and lives with his wife, they have 1 child. He is a nonsmoker and drinks alcohol maybe twice a week, caffeine in the form of coffee, 4 cups per day on average.  He does not have a history of thyroid dysfunction or anemia or iron deficiency. He is not on an antidepressant. He recently stopped his Crestor as he had muscle aching when the dose was increased to 20 mg, he is going to restart at 10 mg strength. His leg movements are described as wrapping his feet against each other while he is asleep. He is not aware of these movements or twitching. They are somewhat bothersome to his wife when she is trying to fall asleep but once she is asleep his movements don't bother her, he is not bothered by his symptoms, he does not endorse any actual symptoms of restless leg syndrome, is not aware of any family history of RLS. He does snore some through his mask, he has had some weight gain over the past few years. Compared to 2011 he has gained about 18 pounds. His bedtime is between 10:30 and 11 PM, rise time around 5:40 AM. He has no nighttime nocturia or morning headaches.  His Past Medical History Is Significant For: Past Medical History:  Diagnosis Date  . ALLERGIC RHINITIS 03/12/2010  . Allergy    seasonal  . Bradycardia 01/11/2017  . FATIGUE 03/12/2010  . Headache(784.0) 03/12/2010  . History of chicken pox   . Hyperglycemia 07/10/2016  . Hyperlipidemia   . Knee pain, right   . Morbid obesity ([place]) 02/26/2011  . Morbid obesity ([place]) 02/26/2011  . Neck pain, acute 03/12/2011  . Overweight(278.02) 02/26/2011  . Preventative health care 11/04/2012  . RLS (restless legs syndrome) 01/08/2017  . SLEEP APNEA, OBSTRUCTIVE 04/24/2010  . SNORING 03/12/2010  . Sports physical 07/16/2012  . Tendonitis  05/19/2012  . Testosterone deficiency 10/19/2011  . Thyroid disease     His Past Surgical History Is Significant For: Past Surgical History:  Procedure Laterality Date  . bicep tendon in left arm  2000    His Family History Is Significant For: Family History  Problem Relation Age of Onset  . Hyperlipidemia Mother   . Lupus Mother   . Alcohol abuse Father        smoked  . Arthritis Father   . Lupus Paternal Grandmother   . Arthritis Maternal Grandmother   . Ulcers Maternal Grandmother   . Alzheimer's disease Maternal Grandfather     His Social History Is Significant For: Social History   Socioeconomic History  . Marital status: Married    Spouse name: Not on file  . Number of children:  1  . Years of education: Not on file  . Highest education level: Not on file  Occupational History  . 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 Occupation: [employer]  Social Needs  . Financial resource strain: Not on file  . Food insecurity:    Worry: Not on file    Inability: Not on file  . Transportation needs:    Medical: Not on file    Non-medical: Not on file  Tobacco Use  . Smoking status: Never Smoker  . Smokeless tobacco: Never Used  Substance and Sexual Activity  . Alcohol use: Yes    Comment: occasional  . Drug use: No  . Sexual activity: Yes  Lifestyle  . Physical activity:    Days per week: Not on file    Minutes per session: Not on file  . Stress: Not on file  Relationships  . Social connections:    Talks on phone: Not on file    Gets together: Not on file    Attends religious service: Not on file    Active member of club or organization: Not on file    Attends meetings of clubs or organizations: Not on file    Relationship status: Not on file  Other Topics Concern  . Not on file  Social History Narrative   Grew up in [place] to [place]    His Allergies Are:  No Known Allergies:   His Current Medications Are:  Outpatient  Encounter Medications as of 07/14/2018  Medication Sig  . Calcium Citrate-Vitamin D 200-250 MG-UNIT TABS Take 1 capsule by mouth daily.  . cetirizine (ZYRTEC) 10 MG tablet Take 10 mg by mouth daily. Reported on 01/03/2016  . diclofenac (VOLTAREN) 75 MG EC tablet TAKE 1 TABLET (75 MG TOTAL) BY MOUTH 2 (TWO) TIMES DAILY.  . fish oil-omega-3 fatty acids 1000 MG capsule Take 2 g by mouth daily.  Marland Kitchen levothyroxine (SYNTHROID, LEVOTHROID) 50 MCG tablet TAKE 1 TABLET BY MOUTH EVERY DAY  . Misc. Devices (NASADOCK) MISC by Does not apply route.  . rosuvastatin (CRESTOR) 10 MG tablet Take 10 mg by mouth daily.  . Testosterone 10 MG/ACT (2%) GEL 4 pumps applied to skin daily  . traMADol (ULTRAM) 50 MG tablet TAKE 1 TABLET BY MOUTH EVERY 12 HOURS AS NEEDED  . Wheat Dextrin (BENEFIBER) POWD 2 tsp po daily in liquids  . [DISCONTINUED] rosuvastatin (CRESTOR) 20 MG tablet Take 1 tablet (20 mg total) by mouth daily. Take one tablet by mouth daily at bedtime.   No facility-administered encounter medications on file as of 07/14/2018.   :  Review of Systems:  Out of a complete 14 point review of systems, all are reviewed and negative with the exception of these symptoms as listed below:  Review of Systems  Neurological:       Pt presents today to discuss his cpap. Pt reports that his new cpap is quieter and smaller than his previous machine.    Objective:  Neurological Exam  Physical Exam Physical Examination:   Vitals:   07/14/18 1410  BP: (!) 151/94  Pulse: 61    General Examination: The patient is a very pleasant 52 y.o. male in no acute distress. He appears well-developed and well-nourished and well groomed.   HEENT: Normocephalic, atraumatic, pupils are equal, round and reactive to light and accommodation. Corrective eye glasses in place. Extraocular tracking is good without limitation to gaze excursion or nystagmus  noted. Normal smooth pursuit is noted. Hearing is grossly intact. Face is  symmetric with normal facial animation and normal facial sensation. Speech is clear with no dysarthria noted. There is no hypophonia. There is no lip, neck/head, jaw or voice tremor. Neck with FROM. Oropharynx exam reveals: mild mouth dryness, adequate dental hygiene and moderate airway crowding. Tongue protrudes centrally and palate elevates symmetrically.   Chest: Clear to auscultation without wheezing, rhonchi or crackles noted.  Heart: S1+S2+0, regular and normal without murmurs, rubs or gallops noted.   Abdomen: Soft, non-tender and non-distended with normal bowel sounds appreciated on auscultation.  Extremities: There is no pitting edema in the distal lower extremities bilaterally.   Skin: Warm and dry without trophic changes noted.  Musculoskeletal: exam reveals no obvious joint deformities, tenderness or joint swelling or erythema.   Neurologically:  Mental status: The patient is awake, alert and oriented in all 4 spheres. His immediate and remote memory, attention, language skills and fund of knowledge are appropriate. There is no evidence of aphasia, agnosia, apraxia or anomia. Speech is clear with normal prosody and enunciation. Thought process is linear. Mood is normal and affect is normal.  Cranial nerves II - XII are as described above under HEENT exam. In addition: shoulder shrug is normal with equal shoulder height noted. Motor exam: Normal bulk, strength and tone is noted. There is no drift, tremor or rebound. Romberg is negative. Fine motor skills and coordination: grossly intact.  Cerebellar testing: No dysmetria or intention tremor. There is no truncal or gait ataxia.  Sensory exam: intact to light touch in the upper and lower extremities.  Gait, station and balance: He stands easily. No veering to one side is noted. No leaning to one side is noted. Posture is age-appropriate and stance is narrow based. Gait shows normal stride length and normal pace. No problems  turning are noted.   Assessment and Plan:  In summary, Dray Dente is a very pleasant 52 year old male with an underlying medical history of allergic rhinitis, hyperlipidemia, restless leg syndrome, thyroid disease, low testosterone, neck pain and obesity, who presents for follow up consultation of his severe sleep apnea, which was confirmed by a recent split night sleep study in June 2019. He did well with CPAP and has establish treatment at home with a new CPAP machine at a pressure of 9 cm via full facemask. He is fully compliant with treatment and highly commended for this. Sleep apnea is under control on the current settings. He is used to using a fullface mask. He does have some air leaking from the mask but typically does not from the top but more closer to where his beard is. He is advised to continue to be fully compliant with treatment. He is also advised to work on weight loss. Since he is stable and not a novice to PAP therapy, we can see him yearly. He is in agreement. I suggested a one-year checkup with one of our nurse practitioners. I answered all his questions today and he was in agreement.  I spent 25 minutes in total face-to-face time with the patient, more than 50% of which was spent in counseling and coordination of care, reviewing test results, reviewing medication and discussing or reviewing the diagnosis of OSA, its prognosis and treatment options. Pertinent laboratory and imaging test results that were available during this visit with the patient were reviewed by me and considered in my medical decision making (see chart for details).

## 2018-07-27 DIAGNOSIS — G4733 Obstructive sleep apnea (adult) (pediatric): Secondary | ICD-10-CM | POA: Diagnosis not present

## 2018-07-30 ENCOUNTER — Ambulatory Visit (INDEPENDENT_AMBULATORY_CARE_PROVIDER_SITE_OTHER): Payer: BLUE CROSS/BLUE SHIELD | Admitting: Family Medicine

## 2018-07-30 ENCOUNTER — Encounter: Payer: Self-pay | Admitting: Family Medicine

## 2018-07-30 VITALS — BP 118/80 | HR 59 | Temp 98.2°F | Resp 18 | Wt 247.0 lb

## 2018-07-30 DIAGNOSIS — R739 Hyperglycemia, unspecified: Secondary | ICD-10-CM

## 2018-07-30 DIAGNOSIS — E038 Other specified hypothyroidism: Secondary | ICD-10-CM | POA: Diagnosis not present

## 2018-07-30 DIAGNOSIS — G8929 Other chronic pain: Secondary | ICD-10-CM

## 2018-07-30 DIAGNOSIS — E349 Endocrine disorder, unspecified: Secondary | ICD-10-CM

## 2018-07-30 DIAGNOSIS — Z0000 Encounter for general adult medical examination without abnormal findings: Secondary | ICD-10-CM

## 2018-07-30 DIAGNOSIS — R350 Frequency of micturition: Secondary | ICD-10-CM | POA: Diagnosis not present

## 2018-07-30 DIAGNOSIS — Z79899 Other long term (current) drug therapy: Secondary | ICD-10-CM | POA: Diagnosis not present

## 2018-07-30 DIAGNOSIS — G4733 Obstructive sleep apnea (adult) (pediatric): Secondary | ICD-10-CM

## 2018-07-30 DIAGNOSIS — M25561 Pain in right knee: Secondary | ICD-10-CM

## 2018-07-30 DIAGNOSIS — E782 Mixed hyperlipidemia: Secondary | ICD-10-CM | POA: Diagnosis not present

## 2018-07-30 LAB — COMPREHENSIVE METABOLIC PANEL
ALBUMIN: 4.5 g/dL (ref 3.5–5.2)
ALK PHOS: 32 U/L — AB (ref 39–117)
ALT: 42 U/L (ref 0–53)
AST: 27 U/L (ref 0–37)
BUN: 19 mg/dL (ref 6–23)
CALCIUM: 9.6 mg/dL (ref 8.4–10.5)
CHLORIDE: 105 meq/L (ref 96–112)
CO2: 26 mEq/L (ref 19–32)
CREATININE: 0.91 mg/dL (ref 0.40–1.50)
GFR: 92.46 mL/min (ref >60.00)
Glucose, Bld: 84 mg/dL (ref 70–99)
POTASSIUM: 4.5 meq/L (ref 3.5–5.1)
SODIUM: 138 meq/L (ref 135–145)
TOTAL PROTEIN: 7.4 g/dL (ref 6.0–8.3)
Total Bilirubin: 0.6 mg/dL (ref 0.2–1.2)

## 2018-07-30 LAB — LIPID PANEL
CHOLESTEROL: 164 mg/dL (ref 0–200)
HDL: 37.2 mg/dL — ABNORMAL LOW (ref >39.00)
LDL CALC: 91 mg/dL (ref 0–99)
NonHDL: 126.9
TRIGLYCERIDES: 179 mg/dL — AB (ref 0.0–149.0)
Total CHOL/HDL Ratio: 4
VLDL: 35.8 mg/dL (ref 0.0–40.0)

## 2018-07-30 LAB — URINALYSIS
BILIRUBIN URINE: NEGATIVE
KETONES UR: NEGATIVE
LEUKOCYTES UA: NEGATIVE
Nitrite: NEGATIVE
Specific Gravity, Urine: 1.02 (ref 1.000–1.030)
Total Protein, Urine: NEGATIVE
URINE GLUCOSE: NEGATIVE
UROBILINOGEN UA: 0.2 (ref 0.0–1.0)
pH: 6 (ref 5.0–8.0)

## 2018-07-30 LAB — HEMOGLOBIN A1C: Hgb A1c MFr Bld: 5.5 % (ref 4.6–6.5)

## 2018-07-30 LAB — TSH: TSH: 1.31 u[IU]/mL (ref 0.35–4.50)

## 2018-07-30 LAB — CBC
HEMATOCRIT: 49.8 % (ref 39.0–52.0)
HEMOGLOBIN: 16.6 g/dL (ref 13.0–17.0)
MCHC: 33.3 g/dL (ref 30.0–36.0)
MCV: 89.1 fl (ref 78.0–100.0)
Platelets: 251 10*3/uL (ref 150.0–400.0)
RBC: 5.59 Mil/uL (ref 4.22–5.81)
RDW: 13.4 % (ref 11.5–15.5)
WBC: 6.3 10*3/uL (ref 4.0–10.5)

## 2018-07-30 LAB — PSA: PSA: 0.71 ng/mL (ref 0.10–4.00)

## 2018-07-30 LAB — TESTOSTERONE: TESTOSTERONE: 1129.61 ng/dL — AB (ref 300.00–890.00)

## 2018-07-30 MED ORDER — TRAMADOL HCL 50 MG PO TABS: 50.0000 mg | ORAL_TABLET | Freq: Two times a day (BID) | ORAL | 0 refills | Status: DC | PRN

## 2018-07-30 NOTE — Assessment & Plan Note (Addendum)
Patient encouraged to maintain heart healthy diet, regular exercise, adequate sleep. Consider daily probiotics. Take medications as prescribed. Consider Shingrix will check with insurance.

## 2018-07-30 NOTE — Assessment & Plan Note (Signed)
Check level 

## 2018-07-30 NOTE — Patient Instructions (Addendum)
Shingrix is the new shingles 2 shots over 2-6 months call insurance regarding payment  Preventive Care 40-64 Years, Male Preventive care refers to lifestyle choices and visits with your health care provider that can promote health and wellness. What does preventive care include?  A yearly physical exam. This is also called an annual well check.  Dental exams once or twice a year.  Routine eye exams. Ask your health care provider how often you should have your eyes checked.  Personal lifestyle choices, including: ? Daily care of your teeth and gums. ? Regular physical activity. ? Eating a healthy diet. ? Avoiding tobacco and drug use. ? Limiting alcohol use. ? Practicing safe sex. ? Taking low-dose aspirin every day starting at age 76. What happens during an annual well check? The services and screenings done by your health care provider during your annual well check will depend on your age, overall health, lifestyle risk factors, and family history of disease. Counseling Your health care provider may ask you questions about your:  Alcohol use.  Tobacco use.  Drug use.  Emotional well-being.  Home and relationship well-being.  Sexual activity.  Eating habits.  Work and work Statistician.  Screening You may have the following tests or measurements:  Height, weight, and BMI.  Blood pressure.  Lipid and cholesterol levels. These may be checked every 5 years, or more frequently if you are over 53 years old.  Skin check.  Lung cancer screening. You may have this screening every year starting at age 8 if you have a 30-pack-year history of smoking and currently smoke or have quit within the past 15 years.  Fecal occult blood test (FOBT) of the stool. You may have this test every year starting at age 53.  Flexible sigmoidoscopy or colonoscopy. You may have a sigmoidoscopy every 5 years or a colonoscopy every 10 years starting at age 53.  Prostate cancer screening.  Recommendations will vary depending on your family history and other risks.  Hepatitis C blood test.  Hepatitis B blood test.  Sexually transmitted disease (STD) testing.  Diabetes screening. This is done by checking your blood sugar (glucose) after you have not eaten for a while (fasting). You may have this done every 1-3 years.  Discuss your test results, treatment options, and if necessary, the need for more tests with your health care provider. Vaccines Your health care provider may recommend certain vaccines, such as:  Influenza vaccine. This is recommended every year.  Tetanus, diphtheria, and acellular pertussis (Tdap, Td) vaccine. You may need a Td booster every 10 years.  Varicella vaccine. You may need this if you have not been vaccinated.  Zoster vaccine. You may need this after age 53.  Measles, mumps, and rubella (MMR) vaccine. You may need at least one dose of MMR if you were born in 1957 or later. You may also need a second dose.  Pneumococcal 13-valent conjugate (PCV13) vaccine. You may need this if you have certain conditions and have not been vaccinated.  Pneumococcal polysaccharide (PPSV23) vaccine. You may need one or two doses if you smoke cigarettes or if you have certain conditions.  Meningococcal vaccine. You may need this if you have certain conditions.  Hepatitis A vaccine. You may need this if you have certain conditions or if you travel or work in places where you may be exposed to hepatitis A.  Hepatitis B vaccine. You may need this if you have certain conditions or if you travel or work in places  where you may be exposed to hepatitis B.  Haemophilus influenzae type b (Hib) vaccine. You may need this if you have certain risk factors.  Talk to your health care provider about which screenings and vaccines you need and how often you need them. This information is not intended to replace advice given to you by your health care provider. Make sure you  discuss any questions you have with your health care provider. Document Released: 11/02/2015 Document Revised: 06/25/2016 Document Reviewed: 08/07/2015 Elsevier Interactive Patient Education  [employer].

## 2018-07-30 NOTE — Assessment & Plan Note (Signed)
Tolerating statin, encouraged heart healthy diet, avoid trans fats, minimize simple carbs and saturated fats. Increase exercise as tolerated 

## 2018-07-30 NOTE — Assessment & Plan Note (Signed)
On Levothyroxine, continue to monitor 

## 2018-07-30 NOTE — Progress Notes (Signed)
Subjective:    Patient ID: Dustin Spears, male    DOB: April 25, 1966, 52 y.o.   MRN: [id number]  No chief complaint on file.   HPI Patient is in today for annual preventative exam and follow up on chronic medical concerns such as low testosterone, hyperlipidemia, hypothyroidism and more. No recent febrile illness or hospitalizations. He is requesting a refill on the Tramadol which he uses very infrequently especially when he overdoes it with physical labor. Helps his pain including his intermittent knee pain. No falls or trauma. He is trying to stay active and maintain a heart healthy diet but does bette some days than others. He injured his right hand and has been following with ortho but did not require surgery.   Past Medical History:  Diagnosis Date  . ALLERGIC RHINITIS 03/12/2010  . Allergy    seasonal  . Bradycardia 01/11/2017  . FATIGUE 03/12/2010  . Headache(784.0) 03/12/2010  . History of chicken pox   . Hyperglycemia 07/10/2016  . Hyperlipidemia   . Knee pain, right   . Morbid obesity (HCC) 02/26/2011  . Morbid obesity (HCC) 02/26/2011  . Neck pain, acute 03/12/2011  . Overweight(278.02) 02/26/2011  . Preventative health care 11/04/2012  . RLS (restless legs syndrome) 01/08/2017  . SLEEP APNEA, OBSTRUCTIVE 04/24/2010  . SNORING 03/12/2010  . Sports physical 07/16/2012  . Tendonitis 05/19/2012  . Testosterone deficiency 10/19/2011  . Thyroid disease     Past Surgical History:  Procedure Laterality Date  . bicep tendon in left arm  2000    Family History  Problem Relation Age of Onset  . Hyperlipidemia Mother   . Lupus Mother   . Alcohol abuse Father        smoked  . Arthritis Father   . Lupus Paternal Grandmother   . Arthritis Maternal Grandmother   . Ulcers Maternal Grandmother   . Alzheimer's disease Maternal Grandfather     Social History   Socioeconomic History  . Marital status: Married    Spouse name: Not on file  . Number of children: 1  . Years of education:  Not on file  . Highest education level: Not on file  Occupational History  . 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 Occupation: [employer]  Social Needs  . Financial resource strain: Not on file  . Food insecurity:    Worry: Not on file    Inability: Not on file  . Transportation needs:    Medical: Not on file    Non-medical: Not on file  Tobacco Use  . Smoking status: Never Smoker  . Smokeless tobacco: Never Used  Substance and Sexual Activity  . Alcohol use: Yes    Comment: occasional  . Drug use: No  . Sexual activity: Yes  Lifestyle  . Physical activity:    Days per week: Not on file    Minutes per session: Not on file  . Stress: Not on file  Relationships  . Social connections:    Talks on phone: Not on file    Gets together: Not on file    Attends religious service: Not on file    Active member of club or organization: Not on file    Attends meetings of clubs or organizations: Not on file    Relationship status: Not on file  . Intimate partner violence:    Fear of current or ex partner: Not on file    Emotionally abused: Not on file  Physically abused: Not on file    Forced sexual activity: Not on file  Other Topics Concern  . Not on file  Social History Narrative   Grew up in [place] to [employer] 1991    Outpatient Medications Prior to Visit  Medication Sig Dispense Refill  . Calcium Citrate-Vitamin D 200-250 MG-UNIT TABS Take 1 capsule by mouth daily. 120 tablet 0  . cetirizine (ZYRTEC) 10 MG tablet Take 10 mg by mouth daily. Reported on 01/03/2016    . diclofenac (VOLTAREN) 75 MG EC tablet TAKE 1 TABLET (75 MG TOTAL) BY MOUTH 2 (TWO) TIMES DAILY. 60 tablet 2  . fish oil-omega-3 fatty acids 1000 MG capsule Take 2 g by mouth daily.    Marland Kitchen levothyroxine (SYNTHROID, LEVOTHROID) 50 MCG tablet TAKE 1 TABLET BY MOUTH EVERY DAY 90 tablet 2  . Misc. Devices (NASADOCK) MISC by Does not apply route.    . rosuvastatin (CRESTOR) 10 MG tablet Take 10  mg by mouth daily.    . Testosterone 10 MG/ACT (2%) GEL 4 pumps applied to skin daily 60 g 3  . Wheat Dextrin (BENEFIBER) POWD 2 tsp po daily in liquids 730 g 0  . traMADol (ULTRAM) 50 MG tablet TAKE 1 TABLET BY MOUTH EVERY 12 HOURS AS NEEDED 30 tablet 0   No facility-administered medications prior to visit.     No Known Allergies  Review of Systems  Constitutional: Negative for chills, fever and malaise/fatigue.  HENT: Negative for congestion and hearing loss.   Eyes: Negative for discharge.  Respiratory: Negative for cough, sputum production and shortness of breath.   Cardiovascular: Negative for chest pain, palpitations and leg swelling.  Gastrointestinal: Negative for abdominal pain, blood in stool, constipation, diarrhea, heartburn, nausea and vomiting.  Genitourinary: Negative for dysuria, frequency, hematuria and urgency.  Musculoskeletal: Negative for back pain, falls and myalgias.  Skin: Negative for rash.  Neurological: Negative for dizziness, sensory change, loss of consciousness, weakness and headaches.  Endo/Heme/Allergies: Negative for environmental allergies. Does not bruise/bleed easily.  Psychiatric/Behavioral: Negative for depression and suicidal ideas. The patient is not nervous/anxious and does not have insomnia.        Objective:    Physical Exam  Constitutional: He is oriented to person, place, and time. He appears well-developed and well-nourished. No distress.  HENT:  Head: Normocephalic and atraumatic.  Nose: Nose normal.  Eyes: Right eye exhibits no discharge. Left eye exhibits no discharge.  Neck: Normal range of motion. Neck supple.  Cardiovascular: Normal rate and regular rhythm.  No murmur heard. Pulmonary/Chest: Effort normal and breath sounds normal.  Abdominal: Soft. Bowel sounds are normal. There is no tenderness.  Musculoskeletal: He exhibits no edema.  Neurological: He is alert and oriented to person, place, and time.  Skin: Skin is warm  and dry.  Psychiatric: He has a normal mood and affect.  Nursing note and vitals reviewed.   BP 118/80 (BP Location: Left Arm, Patient Position: Sitting, Cuff Size: Normal)   Pulse (!) 59   Temp 98.2 F (36.8 C) (Oral)   Resp 18   Wt 247 lb (112 kg)   SpO2 97%   BMI 36.48 kg/m  Wt Readings from Last 3 Encounters:  07/30/18 247 lb (112 kg)  07/14/18 248 lb (112.5 kg)  03/17/18 248 lb (112.5 kg)     Lab Results  Component Value Date   WBC 6.3 07/30/2018   HGB 16.6 07/30/2018   HCT 49.8 07/30/2018   PLT 251.0  07/30/2018   GLUCOSE 84 07/30/2018   CHOL 164 07/30/2018   TRIG 179.0 (H) 07/30/2018   HDL 37.20 (L) 07/30/2018   LDLDIRECT 99.0 01/21/2018   LDLCALC 91 07/30/2018   ALT 42 07/30/2018   AST 27 07/30/2018   NA 138 07/30/2018   K 4.5 07/30/2018   CL 105 07/30/2018   CREATININE 0.91 07/30/2018   BUN 19 07/30/2018   CO2 26 07/30/2018   TSH 1.31 07/30/2018   PSA 0.71 07/30/2018   HGBA1C 5.5 07/30/2018    Lab Results  Component Value Date   TSH 1.31 07/30/2018   Lab Results  Component Value Date   WBC 6.3 07/30/2018   HGB 16.6 07/30/2018   HCT 49.8 07/30/2018   MCV 89.1 07/30/2018   PLT 251.0 07/30/2018   Lab Results  Component Value Date   NA 138 07/30/2018   K 4.5 07/30/2018   CO2 26 07/30/2018   GLUCOSE 84 07/30/2018   BUN 19 07/30/2018   CREATININE 0.91 07/30/2018   BILITOT 0.6 07/30/2018   ALKPHOS 32 (L) 07/30/2018   AST 27 07/30/2018   ALT 42 07/30/2018   PROT 7.4 07/30/2018   ALBUMIN 4.5 07/30/2018   CALCIUM 9.6 07/30/2018   GFR 92.46 07/30/2018   Lab Results  Component Value Date   CHOL 164 07/30/2018   Lab Results  Component Value Date   HDL 37.20 (L) 07/30/2018   Lab Results  Component Value Date   LDLCALC 91 07/30/2018   Lab Results  Component Value Date   TRIG 179.0 (H) 07/30/2018   Lab Results  Component Value Date   CHOLHDL 4 07/30/2018   Lab Results  Component Value Date   HGBA1C 5.5 07/30/2018         Assessment & Plan:   Problem List Items Addressed This Visit    Hypothyroidism    On Levothyroxine, continue to monitor      Relevant Orders   TSH (Completed)   Sleep apnea    Is now using CPAP regularly and has a new machine he tolerates better. Following with pulmonology      Hyperlipidemia    Tolerating statin, encouraged heart healthy diet, avoid trans fats, minimize simple carbs and saturated fats. Increase exercise as tolerated      Relevant Orders   Lipid panel (Completed)   Morbid obesity (HCC)    Encouraged DASH diet, decrease po intake and increase exercise as tolerated. Needs 7-8 hours of sleep nightly. Avoid trans fats, eat small, frequent meals every 4-5 hours with lean proteins, complex carbs and healthy fats. Minimize simple carbs, consider Healthy Weight and Wellness      Knee pain, right    Is allowed a refill on his Tramadol which he uses very in frequently to use prn.       Relevant Medications   traMADol (ULTRAM) 50 MG tablet   Testosterone deficiency    Check level      Relevant Orders   Testosterone (Completed)   PSA (Completed)   Preventative health care    Patient encouraged to maintain heart healthy diet, regular exercise, adequate sleep. Consider daily probiotics. Take medications as prescribed. Consider Shingrix will check with insurance.       Relevant Orders   CBC (Completed)   PSA (Completed)   Hyperglycemia   Relevant Orders   Hemoglobin A1c (Completed)   Comprehensive metabolic panel (Completed)    Other Visit Diagnoses    High risk medication use    -  Primary   Relevant Orders   Pain Mgmt, Profile 8 w/Conf, U (Completed)   Urinary frequency       Relevant Orders   Urinalysis (Completed)      I have changed Arlys John Rusher's traMADol. I am also having him maintain his BENEFIBER, Calcium Citrate-Vitamin D, fish oil-omega-3 fatty acids, NASADOCK, cetirizine, diclofenac, levothyroxine, Testosterone, and rosuvastatin.  Meds  ordered this encounter  Medications  . traMADol (ULTRAM) 50 MG tablet    Sig: Take 1 tablet (50 mg total) by mouth every 12 (twelve) hours as needed.    Dispense:  30 tablet    Refill:  0    Not to exceed 5 additional fills before 01/10/2018.      [name], MD

## 2018-07-31 LAB — PAIN MGMT, PROFILE 8 W/CONF, U
6 Acetylmorphine: NEGATIVE ng/mL (ref <10)
ALCOHOL METABOLITES: NEGATIVE ng/mL (ref <500)
AMPHETAMINES: NEGATIVE ng/mL (ref <500)
BENZODIAZEPINES: NEGATIVE ng/mL (ref <100)
Buprenorphine, Urine: NEGATIVE ng/mL (ref <5)
Cocaine Metabolite: NEGATIVE ng/mL (ref <150)
Creatinine: 153.9 mg/dL
MDMA: NEGATIVE ng/mL (ref <500)
Marijuana Metabolite: NEGATIVE ng/mL (ref <20)
OXIDANT: NEGATIVE ug/mL (ref <200)
OXYCODONE: NEGATIVE ng/mL (ref <100)
Opiates: NEGATIVE ng/mL (ref <100)
pH: 6.46 (ref 4.5–9.0)

## 2018-08-01 ENCOUNTER — Encounter: Payer: Self-pay | Admitting: Family Medicine

## 2018-08-01 NOTE — Assessment & Plan Note (Signed)
Is allowed a refill on his Tramadol which he uses very in frequently to use prn.

## 2018-08-01 NOTE — Assessment & Plan Note (Signed)
Is now using CPAP regularly and has a new machine he tolerates better. Following with pulmonology

## 2018-08-01 NOTE — Assessment & Plan Note (Signed)
Encouraged DASH diet, decrease po intake and increase exercise as tolerated. Needs 7-8 hours of sleep nightly. Avoid trans fats, eat small, frequent meals every 4-5 hours with lean proteins, complex carbs and healthy fats. Minimize simple carbs, consider Healthy Weight and Wellness  

## 2018-08-24 DIAGNOSIS — G4733 Obstructive sleep apnea (adult) (pediatric): Secondary | ICD-10-CM | POA: Diagnosis not present

## 2018-09-23 DIAGNOSIS — G4733 Obstructive sleep apnea (adult) (pediatric): Secondary | ICD-10-CM | POA: Diagnosis not present

## 2018-10-11 ENCOUNTER — Other Ambulatory Visit: Payer: Self-pay | Admitting: Family Medicine

## 2018-10-12 ENCOUNTER — Other Ambulatory Visit: Payer: Self-pay | Admitting: Family Medicine

## 2018-10-12 NOTE — Telephone Encounter (Signed)
Requesting:testosterone Contract:yes UDS:low risk next screen 01/29/19 Last OV:07/30/18 Next OV:01/27/19 Last Refill:06/08/18  #60g-3rf Database:   Please advise

## 2018-10-14 DIAGNOSIS — G4733 Obstructive sleep apnea (adult) (pediatric): Secondary | ICD-10-CM | POA: Diagnosis not present

## 2018-10-24 DIAGNOSIS — G4733 Obstructive sleep apnea (adult) (pediatric): Secondary | ICD-10-CM | POA: Diagnosis not present

## 2018-11-24 DIAGNOSIS — G4733 Obstructive sleep apnea (adult) (pediatric): Secondary | ICD-10-CM | POA: Diagnosis not present

## 2018-12-13 ENCOUNTER — Other Ambulatory Visit: Payer: Self-pay | Admitting: Family Medicine

## 2018-12-23 DIAGNOSIS — G4733 Obstructive sleep apnea (adult) (pediatric): Secondary | ICD-10-CM | POA: Diagnosis not present

## 2019-01-05 ENCOUNTER — Other Ambulatory Visit: Payer: Self-pay | Admitting: Family Medicine

## 2019-01-05 DIAGNOSIS — M25561 Pain in right knee: Principal | ICD-10-CM

## 2019-01-05 DIAGNOSIS — G8929 Other chronic pain: Secondary | ICD-10-CM

## 2019-01-05 NOTE — Telephone Encounter (Signed)
Requesting: Tramadol Contract: No UDS: Yes, low risk, next screen 01/29/2019 Last OV: 07/30/2018 Next OV: 01/27/2019 Last Refill: 07/30/2018, #30--0 RF Database:   Please advise

## 2019-01-23 DIAGNOSIS — G4733 Obstructive sleep apnea (adult) (pediatric): Secondary | ICD-10-CM | POA: Diagnosis not present

## 2019-01-27 ENCOUNTER — Ambulatory Visit (INDEPENDENT_AMBULATORY_CARE_PROVIDER_SITE_OTHER): Payer: BLUE CROSS/BLUE SHIELD | Admitting: Family Medicine

## 2019-01-27 ENCOUNTER — Other Ambulatory Visit: Payer: Self-pay

## 2019-01-27 DIAGNOSIS — E349 Endocrine disorder, unspecified: Secondary | ICD-10-CM

## 2019-01-27 DIAGNOSIS — E782 Mixed hyperlipidemia: Secondary | ICD-10-CM | POA: Diagnosis not present

## 2019-01-27 DIAGNOSIS — R739 Hyperglycemia, unspecified: Secondary | ICD-10-CM

## 2019-01-27 NOTE — Assessment & Plan Note (Signed)
Staying active at home and trying to stay active

## 2019-01-27 NOTE — Assessment & Plan Note (Signed)
Feels well on current dose of meds. No changes. Recheck labs with next visit

## 2019-01-27 NOTE — Assessment & Plan Note (Signed)
Encouraged heart healthy diet, increase exercise, avoid trans fats, consider a krill oil cap daily 

## 2019-01-27 NOTE — Progress Notes (Signed)
Virtual Visit via Telephone Note  I connected with Dustin Spears on 01/27/19 at 10:00 AM EDT by telephone and verified that I am speaking with the correct person using two identifiers.   I discussed the limitations, risks, security and privacy concerns of performing an evaluation and management service by telephone and the availability of in person appointments. I also discussed with the patient that there may be a patient responsible charge related to this service. The patient expressed understanding and agreed to proceed. Attempted video visit but was unable to complete.    History of Present Illness: Patient interviewed on phone for follow up on chronic medical concerns including hyperglycemia, low testosterone, hyperlipidemia, obesity and more. He feels well. He has been sent home from work so is doing a great job of staying quarantined at home. Denies CP/palp/SOB/HA/congestion/fevers/GI or GU c/o. Taking meds as prescribed    Observations/Objective: unable to obtain via telephone.    Assessment and Plan:  Hyperglycemia He feels well and does not endorse polyuria or opolydipsia. Will check labs at next visit. hgba1c acceptable, minimize simple carbs. Increase exercise as tolerated.   Hyperlipidemia Encouraged heart healthy diet, increase exercise, avoid trans fats, consider a krill oil cap daily  Testosterone deficiency Feels well on current dose of meds. No changes. Recheck labs with next visit  Morbid obesity (HCC) Staying active at home and trying to stay active   Follow Up Instructions: 4-6 months for CPE and labs    I discussed the assessment and treatment plan with the patient. The patient was provided an opportunity to ask questions and all were answered. The patient agreed with the plan and demonstrated an understanding of the instructions.   The patient was advised to call back or seek an in-person evaluation if the symptoms worsen or if the condition fails to improve as  anticipated.  I provided 15 minutes of non-face-to-face time during this encounter.   [name], MD

## 2019-01-27 NOTE — Assessment & Plan Note (Signed)
He feels well and does not endorse polyuria or opolydipsia. Will check labs at next visit. hgba1c acceptable, minimize simple carbs. Increase exercise as tolerated.

## 2019-02-11 ENCOUNTER — Telehealth: Payer: Self-pay | Admitting: *Deleted

## 2019-02-11 NOTE — Telephone Encounter (Signed)
Received Physician Orders from [facility]; forwarded to provider/SLS 04/24  

## 2019-02-16 ENCOUNTER — Telehealth: Payer: Self-pay | Admitting: Family Medicine

## 2019-02-16 NOTE — Telephone Encounter (Signed)
[facility]) requesting rx for CPAP supplies.

## 2019-02-17 NOTE — Telephone Encounter (Signed)
Spoke with someone from [facility] and let them know that Dr. Abner Greenspan does not manage patients CPAP machines they will need to get in touch with his pulmonologist

## 2019-02-19 ENCOUNTER — Other Ambulatory Visit: Payer: Self-pay | Admitting: Family Medicine

## 2019-02-21 DIAGNOSIS — G4733 Obstructive sleep apnea (adult) (pediatric): Secondary | ICD-10-CM | POA: Diagnosis not present

## 2019-02-21 NOTE — Telephone Encounter (Signed)
Requesting:testosterone  Contract:yes UDS:low risk next screen 01/29/19 Last OV:01/27/19 Next OV:n/a Last Refill:10/12/18  #60g-3rf Database:   Please advise

## 2019-05-18 ENCOUNTER — Other Ambulatory Visit: Payer: Self-pay | Admitting: Family Medicine

## 2019-05-18 NOTE — Telephone Encounter (Signed)
Requesting:testosterone  Contract:yes UDS:low risk needs one  Last OV:01/27/19 Next OV:n/a Last Refill:02/21/19  #60g/3rf Database:   Please advise

## 2019-05-25 DIAGNOSIS — G4733 Obstructive sleep apnea (adult) (pediatric): Secondary | ICD-10-CM | POA: Diagnosis not present

## 2019-06-23 ENCOUNTER — Encounter: Payer: Self-pay | Admitting: Family Medicine

## 2019-06-23 MED ORDER — DICLOFENAC SODIUM 75 MG PO TBEC: 75.0000 mg | DELAYED_RELEASE_TABLET | Freq: Two times a day (BID) | ORAL | 3 refills | Status: DC | PRN

## 2019-07-20 ENCOUNTER — Ambulatory Visit: Payer: BC Managed Care – PPO | Admitting: Family Medicine

## 2019-07-20 ENCOUNTER — Ambulatory Visit: Payer: BLUE CROSS/BLUE SHIELD | Admitting: Adult Health

## 2019-08-04 ENCOUNTER — Other Ambulatory Visit: Payer: Self-pay | Admitting: Family Medicine

## 2019-08-04 DIAGNOSIS — M25561 Pain in right knee: Secondary | ICD-10-CM

## 2019-08-04 DIAGNOSIS — G8929 Other chronic pain: Secondary | ICD-10-CM

## 2019-08-05 NOTE — Telephone Encounter (Signed)
Requesting:tramadol  Contract:yes UDS:low risk next screen 01/29/19 Last OV:01/24/19 Next OV:n/a Last Refill:01/05/19  #14-2rf Database:   Please advise

## 2019-08-10 ENCOUNTER — Other Ambulatory Visit: Payer: Self-pay | Admitting: Family Medicine

## 2019-08-24 DIAGNOSIS — G4733 Obstructive sleep apnea (adult) (pediatric): Secondary | ICD-10-CM | POA: Diagnosis not present

## 2019-10-12 ENCOUNTER — Other Ambulatory Visit: Payer: Self-pay | Admitting: Family Medicine

## 2019-10-12 DIAGNOSIS — G8929 Other chronic pain: Secondary | ICD-10-CM

## 2019-10-13 NOTE — Telephone Encounter (Signed)
Requesting:tramdol  & testosterone  Contract:yes UDS:low risk next screen 01/29/19 Last OV:01/27/19 Next OV:n/a Last Refill: Tramadol 08/05/19  #14-0rf Testosterone 05/18/19 #60-0rf Database:   Please advise

## 2019-11-19 ENCOUNTER — Other Ambulatory Visit: Payer: Self-pay | Admitting: Family Medicine

## 2020-01-18 ENCOUNTER — Other Ambulatory Visit: Payer: Self-pay | Admitting: Family Medicine

## 2020-01-18 DIAGNOSIS — G8929 Other chronic pain: Secondary | ICD-10-CM

## 2020-01-18 NOTE — Telephone Encounter (Signed)
Requesting: tramadol Contract:n/a UDS:07/30/18 Last Visit:01/27/20 Next Visit:n/a Last Refill:10/13/19  Please Advise

## 2020-03-04 ENCOUNTER — Other Ambulatory Visit: Payer: Self-pay | Admitting: Family Medicine

## 2020-03-04 DIAGNOSIS — M25561 Pain in right knee: Secondary | ICD-10-CM

## 2020-03-04 DIAGNOSIS — G8929 Other chronic pain: Secondary | ICD-10-CM

## 2020-03-05 NOTE — Telephone Encounter (Signed)
Requesting:   Tramadol and testosterone Contract:  none UDS:  07/30/2018 Last Visit:   01/27/2019 Next Visit:  None Last Refill:   Tramadol--#14 no refills on 01/18/2020                     Testosterone---60gwith 3 refills on 10/13/2019  Please Advise

## 2020-04-10 ENCOUNTER — Other Ambulatory Visit: Payer: Self-pay | Admitting: Family Medicine

## 2020-04-10 DIAGNOSIS — G8929 Other chronic pain: Secondary | ICD-10-CM

## 2020-05-26 ENCOUNTER — Other Ambulatory Visit: Payer: Self-pay | Admitting: Family Medicine

## 2020-07-07 ENCOUNTER — Other Ambulatory Visit: Payer: Self-pay | Admitting: Family Medicine

## 2020-09-05 ENCOUNTER — Other Ambulatory Visit: Payer: Self-pay | Admitting: Family Medicine

## 2020-09-05 DIAGNOSIS — G8929 Other chronic pain: Secondary | ICD-10-CM

## 2020-09-05 DIAGNOSIS — M25561 Pain in right knee: Secondary | ICD-10-CM

## 2020-09-06 NOTE — Telephone Encounter (Signed)
Requesting: tramadol 50mg  and testosterone 2% gel  Contract: 08/03/2018  UDS: 07/30/2018 Last Visit: 01/27/2019  Next Visit: 10/08/2020 Last Refill on tramadol: 03/05/2020 #14 and 0RF Last Refill on testosterone: 07/09/2020 #60g and 1RF  Please Advise

## 2020-10-08 ENCOUNTER — Ambulatory Visit: Payer: BLUE CROSS/BLUE SHIELD | Admitting: Family Medicine

## 2020-11-07 ENCOUNTER — Other Ambulatory Visit: Payer: Self-pay | Admitting: Family Medicine

## 2020-11-07 NOTE — Telephone Encounter (Signed)
Requesting: testosterone 10mg /act (2%) gel Contract: n/a UDS: n/a Last Visit: 01/27/2019 Next Visit: 11/19/20 Last Refill: 09/06/2020 #60g and 1RF  Please Advise

## 2020-11-19 ENCOUNTER — Other Ambulatory Visit: Payer: Self-pay

## 2020-11-19 ENCOUNTER — Ambulatory Visit: Payer: BC Managed Care – PPO | Admitting: Family Medicine

## 2020-11-19 VITALS — BP 116/72 | HR 77 | Temp 98.2°F | Resp 16 | Ht 69.0 in | Wt 256.4 lb

## 2020-11-19 DIAGNOSIS — M25561 Pain in right knee: Secondary | ICD-10-CM

## 2020-11-19 DIAGNOSIS — R739 Hyperglycemia, unspecified: Secondary | ICD-10-CM

## 2020-11-19 DIAGNOSIS — E038 Other specified hypothyroidism: Secondary | ICD-10-CM

## 2020-11-19 DIAGNOSIS — E349 Endocrine disorder, unspecified: Secondary | ICD-10-CM

## 2020-11-19 DIAGNOSIS — R001 Bradycardia, unspecified: Secondary | ICD-10-CM

## 2020-11-19 DIAGNOSIS — M2550 Pain in unspecified joint: Secondary | ICD-10-CM

## 2020-11-19 DIAGNOSIS — G8929 Other chronic pain: Secondary | ICD-10-CM

## 2020-11-19 DIAGNOSIS — E782 Mixed hyperlipidemia: Secondary | ICD-10-CM

## 2020-11-19 MED ORDER — DICLOFENAC SODIUM 75 MG PO TBEC
75.0000 mg | DELAYED_RELEASE_TABLET | Freq: Two times a day (BID) | ORAL | 1 refills | Status: DC | PRN
Start: 2020-11-19 — End: 2022-01-15

## 2020-11-19 MED ORDER — TRAMADOL HCL 50 MG PO TABS: 50.0000 mg | ORAL_TABLET | Freq: Two times a day (BID) | ORAL | 0 refills | Status: DC | PRN

## 2020-11-19 MED ORDER — ROSUVASTATIN CALCIUM 5 MG PO TABS: 5.0000 mg | ORAL_TABLET | Freq: Every day | ORAL | 1 refills | Status: DC

## 2020-11-19 NOTE — Assessment & Plan Note (Signed)
Knee, hip and back pain bother him daily. He is encouraged to stay active and continue Diclofenac each day. Refill given. Use Tylenol 500 mg po bid, may use Tramadol prn for severe pain

## 2020-11-19 NOTE — Assessment & Plan Note (Signed)
Encouraged DASH diet, decrease po intake and increase exercise as tolerated. Needs 7-8 hours of sleep nightly. Avoid trans fats, eat small, frequent meals every 4-5 hours with lean proteins, complex carbs and healthy fats. Minimize simple carbs 

## 2020-11-19 NOTE — Assessment & Plan Note (Signed)
He stopped the Rosuvastatin as it caused some myalgias and memory concerns. He is willing to restart at 5 mg daily with CoQ10 enzyme. Check labs.

## 2020-11-19 NOTE — Patient Instructions (Addendum)
Magnesium Glycinate 400 mg daily for sleep  (better than oxide or carbonate)  Tylenol ES 500 mg take 1-2 twice a day on a scheduled  https://doi.org/10.23970/AHRQEPCCER227">  Chronic Back Pain When back pain lasts longer than 3 months, it is called chronic back pain. The cause of your back pain may not be known. Some common causes include:  Wear and tear (degenerative disease) of the bones, ligaments, or disks in your back.  Inflammation and stiffness in your back (arthritis). People who have chronic back pain often go through certain periods in which the pain is more intense (flare-ups). Many people can learn to manage the pain with home care. Follow these instructions at home: Pay attention to any changes in your symptoms. Take these actions to help with your pain: Managing pain and stiffness  If directed, apply ice to the painful area. Your health care provider may recommend applying ice during the first 24-48 hours after a flare-up begins. To do this: ? Put ice in a plastic bag. ? Place a towel between your skin and the bag. ? Leave the ice on for 20 minutes, 2-3 times per day.  If directed, apply heat to the affected area as often as told by your health care provider. Use the heat source that your health care provider recommends, such as a moist heat pack or a heating pad. ? Place a towel between your skin and the heat source. ? Leave the heat on for 20-30 minutes. ? Remove the heat if your skin turns bright red. This is especially important if you are unable to feel pain, heat, or cold. You may have a greater risk of getting burned.  Try soaking in a warm tub.      Activity  Avoid bending and other activities that make the problem worse.  Maintain a proper position when standing or sitting: ? When standing, keep your upper back and neck straight, with your shoulders pulled back. Avoid slouching. ? When sitting, keep your back straight and relax your shoulders. Do not round  your shoulders or pull them backward.  Do not sit or stand in one place for long periods of time.  Take brief periods of rest throughout the day. This will reduce your pain. Resting in a lying or standing position is usually better than sitting to rest.  When you are resting for longer periods, mix in some mild activity or stretching between periods of rest. This will help to prevent stiffness and pain.  Get regular exercise. Ask your health care provider what activities are safe for you.  Do not lift anything that is heavier than 10 lb (4.5 kg), or the limit that you are told, until your health care provider says that it is safe. Always use proper lifting technique, which includes: ? Bending your knees. ? Keeping the load close to your body. ? Avoiding twisting.  Sleep on a firm mattress in a comfortable position. Try lying on your side with your knees slightly bent. If you lie on your back, put a pillow under your knees.   Medicines  Treatment may include medicines for pain and inflammation taken by mouth or applied to the skin, prescription pain medicine, or muscle relaxants. Take over-the-counter and prescription medicines only as told by your health care provider.  Ask your health care provider if the medicine prescribed to you: ? Requires you to avoid driving or using machinery. ? Can cause constipation. You may need to take these actions to prevent or treat  constipation:  Drink enough fluid to keep your urine pale yellow.  Take over-the-counter or prescription medicines.  Eat foods that are high in fiber, such as beans, whole grains, and fresh fruits and vegetables.  Limit foods that are high in fat and processed sugars, such as fried or sweet foods. General instructions  Do not use any products that contain nicotine or tobacco, such as cigarettes, e-cigarettes, and chewing tobacco. If you need help quitting, ask your health care provider.  Keep all follow-up visits as told  by your health care provider. This is important. Contact a health care provider if:  You have pain that is not relieved with rest or medicine.  Your pain gets worse, or you have new pain.  You have a high fever.  You have rapid weight loss.  You have trouble doing your normal activities. Get help right away if:  You have weakness or numbness in one or both of your legs or feet.  You have trouble controlling your bladder or your bowels.  You have severe back pain and have any of the following: ? Nausea or vomiting. ? Pain in your abdomen. ? Shortness of breath or you faint. Summary  Chronic back pain is back pain that lasts longer than 3 months.  When a flare-up begins, apply ice to the painful area for the first 24-48 hours.  Apply a moist heat pad or use a heating pad on the painful area as directed by your health care provider.  When you are resting for longer periods, mix in some mild activity or stretching between periods of rest. This will help to prevent stiffness and pain. This information is not intended to replace advice given to you by your health care provider. Make sure you discuss any questions you have with your health care provider. Document Revised: 11/16/2019 Document Reviewed: 11/16/2019 Elsevier Patient Education  2021 [employer].

## 2020-11-19 NOTE — Assessment & Plan Note (Signed)
On Levothyroxine, continue to monitor 

## 2020-11-19 NOTE — Assessment & Plan Note (Signed)
hgba1c acceptable, minimize simple carbs. Increase exercise as tolerated.  

## 2020-11-19 NOTE — Assessment & Plan Note (Signed)
Supplement and monitor 

## 2020-11-19 NOTE — Progress Notes (Signed)
Subjective:    Patient ID: Dustin Spears, male    DOB: 05/18/1966, 55 y.o.   MRN: [id number]  Chief Complaint  Patient presents with  . Follow-up    Blood work    HPI Patient is in today for follow up on chronic medical concerns. No recent febrile illness or hospitalizations. No acute concerns but he does struggle with daily myalgias, arthralgias and stiffness in his hips, knees and low back most notably. Denies CP/palp/SOB/HA/congestion/fevers/GI or GU c/o. Taking meds as prescribed. Tolerated his COVID shots but did have a flu like reaction to his second shot but it only lasted 24 hours. Denies CP/palp/SOB/HA/congestion/fevers/GI or GU c/o. Taking meds as prescribed  Past Medical History:  Diagnosis Date  . ALLERGIC RHINITIS 03/12/2010  . Allergy    seasonal  . Bradycardia 01/11/2017  . FATIGUE 03/12/2010  . Headache(784.0) 03/12/2010  . History of chicken pox   . Hyperglycemia 07/10/2016  . Hyperlipidemia   . Knee pain, right   . Morbid obesity (HCC) 02/26/2011  . Morbid obesity (HCC) 02/26/2011  . Neck pain, acute 03/12/2011  . Overweight(278.02) 02/26/2011  . Preventative health care 11/04/2012  . RLS (restless legs syndrome) 01/08/2017  . SLEEP APNEA, OBSTRUCTIVE 04/24/2010  . SNORING 03/12/2010  . Sports physical 07/16/2012  . Tendonitis 05/19/2012  . Testosterone deficiency 10/19/2011  . Thyroid disease     Past Surgical History:  Procedure Laterality Date  . bicep tendon in left arm  2000    Family History  Problem Relation Age of Onset  . Hyperlipidemia Mother   . Lupus Mother   . Alcohol abuse Father        smoked  . Arthritis Father   . Lupus Paternal Grandmother   . Arthritis Maternal Grandmother   . Ulcers Maternal Grandmother   . Alzheimer's disease Maternal Grandfather     Social History   Socioeconomic History  . Marital status: Married    Spouse name: Not on file  . Number of children: 1  . Years of education: Not on file  . Highest education level: Not  on file  Occupational History  . 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 Occupation: [employer]  Tobacco Use  . Smoking status: Never Smoker  . Smokeless tobacco: Never Used  Substance and Sexual Activity  . Alcohol use: Yes    Comment: occasional  . Drug use: No  . Sexual activity: Yes  Other Topics Concern  . Not on file  Social History Narrative   Grew up in [place] to [employer] [place]   Social Determinants of Health   Financial Resource Strain: Not on file  Food Insecurity: Not on file  Transportation Needs: Not on file  Physical Activity: Not on file  Stress: Not on file  Social Connections: Not on file  Intimate Partner Violence: Not on file    Outpatient Medications Prior to Visit  Medication Sig Dispense Refill  . Calcium Citrate-Vitamin D 200-250 MG-UNIT TABS Take 1 capsule by mouth daily. 120 tablet 0  . cetirizine (ZYRTEC) 10 MG tablet Take 10 mg by mouth daily. Reported on 01/03/2016    . fish oil-omega-3 fatty acids 1000 MG capsule Take 2 g by mouth daily.    Marland Kitchen levothyroxine (SYNTHROID) 50 MCG tablet TAKE 1 TABLET BY MOUTH EVERY DAY 90 tablet 2  . Misc. Devices (NASADOCK) MISC by Does not apply route.    . Testosterone 10 MG/ACT (2%) GEL  APPLY 4 PUMPS TO SKIN DAILY 60 g 1  . Wheat Dextrin (BENEFIBER) POWD 2 tsp po daily in liquids 730 g 0  . diclofenac (VOLTAREN) 75 MG EC tablet Take 1 tablet (75 mg total) by mouth 2 (two) times daily as needed for moderate pain. 30 tablet 0  . traMADol (ULTRAM) 50 MG tablet TAKE 1 TABLET BY MOUTH EVERY 12 HOURS AS NEEDED 14 tablet 0  . rosuvastatin (CRESTOR) 10 MG tablet Take 10 mg by mouth daily.     No facility-administered medications prior to visit.    No Known Allergies  Review of Systems  Constitutional: Positive for malaise/fatigue. Negative for fever.  HENT: Negative for congestion.   Eyes: Negative for blurred vision.  Respiratory: Negative for shortness of breath.   Cardiovascular: Negative for  chest pain, palpitations and leg swelling.  Gastrointestinal: Negative for abdominal pain, blood in stool and nausea.  Genitourinary: Negative for dysuria and frequency.  Musculoskeletal: Positive for back pain, joint pain and myalgias. Negative for falls.  Skin: Negative for rash.  Neurological: Negative for dizziness, loss of consciousness and headaches.  Endo/Heme/Allergies: Negative for environmental allergies.  Psychiatric/Behavioral: Negative for depression. The patient is not nervous/anxious.        Objective:    Physical Exam  BP 116/72   Pulse 77   Temp 98.2 F (36.8 C)   Resp 16   Ht 5\' 9"  (1.753 m)   Wt 256 lb 6.4 oz (116.3 kg)   SpO2 98%   BMI 37.86 kg/m  Wt Readings from Last 3 Encounters:  11/19/20 256 lb 6.4 oz (116.3 kg)  07/30/18 247 lb (112 kg)  07/14/18 248 lb (112.5 kg)    Diabetic Foot Exam - Simple   No data filed    Lab Results  Component Value Date   WBC 6.3 07/30/2018   HGB 16.6 07/30/2018   HCT 49.8 07/30/2018   PLT 251.0 07/30/2018   GLUCOSE 84 07/30/2018   CHOL 164 07/30/2018   TRIG 179.0 (H) 07/30/2018   HDL 37.20 (L) 07/30/2018   LDLDIRECT 99.0 01/21/2018   LDLCALC 91 07/30/2018   ALT 42 07/30/2018   AST 27 07/30/2018   NA 138 07/30/2018   K 4.5 07/30/2018   CL 105 07/30/2018   CREATININE 0.91 07/30/2018   BUN 19 07/30/2018   CO2 26 07/30/2018   TSH 1.31 07/30/2018   PSA 0.71 07/30/2018   HGBA1C 5.5 07/30/2018    Lab Results  Component Value Date   TSH 1.31 07/30/2018   Lab Results  Component Value Date   WBC 6.3 07/30/2018   HGB 16.6 07/30/2018   HCT 49.8 07/30/2018   MCV 89.1 07/30/2018   PLT 251.0 07/30/2018   Lab Results  Component Value Date   NA 138 07/30/2018   K 4.5 07/30/2018   CO2 26 07/30/2018   GLUCOSE 84 07/30/2018   BUN 19 07/30/2018   CREATININE 0.91 07/30/2018   BILITOT 0.6 07/30/2018   ALKPHOS 32 (L) 07/30/2018   AST 27 07/30/2018   ALT 42 07/30/2018   PROT 7.4 07/30/2018   ALBUMIN 4.5  07/30/2018   CALCIUM 9.6 07/30/2018   GFR 92.46 07/30/2018   Lab Results  Component Value Date   CHOL 164 07/30/2018   Lab Results  Component Value Date   HDL 37.20 (L) 07/30/2018   Lab Results  Component Value Date   LDLCALC 91 07/30/2018   Lab Results  Component Value Date   TRIG 179.0 (H) 07/30/2018  Lab Results  Component Value Date   CHOLHDL 4 07/30/2018   Lab Results  Component Value Date   HGBA1C 5.5 07/30/2018       Assessment & Plan:   Problem List Items Addressed This Visit    Hypothyroidism - Primary    On Levothyroxine, continue to monitor      Relevant Orders   TSH   Hyperlipidemia    He stopped the Rosuvastatin as it caused some myalgias and memory concerns. He is willing to restart at 5 mg daily with CoQ10 enzyme. Check labs.      Relevant Medications   rosuvastatin (CRESTOR) 5 MG tablet   Other Relevant Orders   Lipid panel   Morbid obesity (HCC)    Encouraged DASH diet, decrease po intake and increase exercise as tolerated. Needs 7-8 hours of sleep nightly. Avoid trans fats, eat small, frequent meals every 4-5 hours with lean proteins, complex carbs and healthy fats. Minimize simple carbs      Knee pain, right   Relevant Medications   traMADol (ULTRAM) 50 MG tablet   Testosterone deficiency    Supplement and monitor      Relevant Orders   Testosterone   Arthralgia    Knee, hip and back pain bother him daily. He is encouraged to stay active and continue Diclofenac each day. Refill given. Use Tylenol 500 mg po bid, may use Tramadol prn for severe pain      Hyperglycemia    hgba1c acceptable, minimize simple carbs. Increase exercise as tolerated.      Relevant Orders   Hemoglobin A1c   Comprehensive metabolic panel   Bradycardia   Relevant Orders   CBC      I have discontinued Thaddeaus Kielbasa's rosuvastatin. I have also changed his traMADol. Additionally, I am having him start on rosuvastatin. Lastly, I am having him maintain  his Benefiber, Calcium Citrate-Vitamin D, fish oil-omega-3 fatty acids, [place], cetirizine, levothyroxine, Testosterone, and diclofenac.  Meds ordered this encounter  Medications  . rosuvastatin (CRESTOR) 5 MG tablet    Sig: Take 1 tablet (5 mg total) by mouth daily.    Dispense:  90 tablet    Refill:  1  . diclofenac (VOLTAREN) 75 MG EC tablet    Sig: Take 1 tablet (75 mg total) by mouth 2 (two) times daily as needed for moderate pain.    Dispense:  180 tablet    Refill:  1    15 day supply, last ov 2020, he does have a visit 11/19/2020, further refills if needed at that time  . traMADol (ULTRAM) 50 MG tablet    Sig: Take 1 tablet (50 mg total) by mouth every 12 (twelve) hours as needed.    Dispense:  30 tablet    Refill:  0    This request is for a new prescription for a controlled substance as required by Federal/State law.     [name], MD

## 2020-11-20 LAB — TESTOSTERONE: Testosterone: 686.92 ng/dL (ref 300.00–890.00)

## 2020-11-20 LAB — COMPREHENSIVE METABOLIC PANEL
ALT: 75 U/L — ABNORMAL HIGH (ref 0–53)
AST: 40 U/L — ABNORMAL HIGH (ref 0–37)
Albumin: 4.7 g/dL (ref 3.5–5.2)
Alkaline Phosphatase: 32 U/L — ABNORMAL LOW (ref 39–117)
BUN: 18 mg/dL (ref 6–23)
CO2: 24 mEq/L (ref 19–32)
Calcium: 9.8 mg/dL (ref 8.4–10.5)
Chloride: 104 mEq/L (ref 96–112)
Creatinine, Ser: 0.95 mg/dL (ref 0.40–1.50)
GFR: 89.94 mL/min (ref >60.00)
Glucose, Bld: 76 mg/dL (ref 70–99)
Potassium: 4.3 mEq/L (ref 3.5–5.1)
Sodium: 136 mEq/L (ref 135–145)
Total Bilirubin: 0.4 mg/dL (ref 0.2–1.2)
Total Protein: 7.7 g/dL (ref 6.0–8.3)

## 2020-11-20 LAB — CBC
HCT: 46.9 % (ref 39.0–52.0)
Hemoglobin: 15.9 g/dL (ref 13.0–17.0)
MCHC: 34 g/dL (ref 30.0–36.0)
MCV: 87.7 fl (ref 78.0–100.0)
Platelets: 250 10*3/uL (ref 150.0–400.0)
RBC: 5.35 Mil/uL (ref 4.22–5.81)
RDW: 13.6 % (ref 11.5–15.5)
WBC: 7.4 10*3/uL (ref 4.0–10.5)

## 2020-11-20 LAB — LIPID PANEL
Cholesterol: 269 mg/dL — ABNORMAL HIGH (ref 0–200)
HDL: 40.2 mg/dL (ref >39.00)
NonHDL: 228.31
Total CHOL/HDL Ratio: 7
Triglycerides: 291 mg/dL — ABNORMAL HIGH (ref 0.0–149.0)
VLDL: 58.2 mg/dL — ABNORMAL HIGH (ref 0.0–40.0)

## 2020-11-20 LAB — LDL CHOLESTEROL, DIRECT: Direct LDL: 185 mg/dL

## 2020-11-20 LAB — HEMOGLOBIN A1C: Hgb A1c MFr Bld: 6 % (ref 4.6–6.5)

## 2020-11-20 LAB — TSH: TSH: 5.89 u[IU]/mL — ABNORMAL HIGH (ref 0.35–4.50)

## 2020-12-09 DIAGNOSIS — Z1211 Encounter for screening for malignant neoplasm of colon: Secondary | ICD-10-CM | POA: Diagnosis not present

## 2020-12-14 LAB — COLOGUARD
COLOGUARD: NEGATIVE
Cologuard: NEGATIVE

## 2020-12-21 ENCOUNTER — Telehealth: Payer: Self-pay

## 2020-12-21 NOTE — Telephone Encounter (Signed)
LVM to call back to go over cologuard  Per Dr. Abner Greenspan cologuard is negative

## 2021-01-11 ENCOUNTER — Other Ambulatory Visit: Payer: Self-pay | Admitting: Family Medicine

## 2021-01-11 NOTE — Telephone Encounter (Signed)
Requesting: Testosterone Contract:  UDS: 07/30/18 Last Visit:  11/19/20 Next Visit: 06/04/21 Last Refill: 11/07/20  Please Advise

## 2021-02-22 ENCOUNTER — Other Ambulatory Visit: Payer: Self-pay | Admitting: Family Medicine

## 2021-03-12 ENCOUNTER — Other Ambulatory Visit: Payer: Self-pay | Admitting: Family Medicine

## 2021-03-23 ENCOUNTER — Other Ambulatory Visit: Payer: Self-pay | Admitting: Family Medicine

## 2021-03-25 NOTE — Telephone Encounter (Signed)
Requesting: testosterone Contract:  UDS: 07/30/18 Last Visit: 11/19/20 Next Visit: 06/04/21 Last Refill: 03/13/21  Please Advise

## 2021-05-19 ENCOUNTER — Other Ambulatory Visit: Payer: Self-pay | Admitting: Family Medicine

## 2021-05-27 ENCOUNTER — Other Ambulatory Visit: Payer: Self-pay | Admitting: Family Medicine

## 2021-05-28 NOTE — Telephone Encounter (Signed)
Requesting: testosterone 10mg /act 2% gel Contract: 11/26/2016 UDS: 07/30/2018 Last Visit: 11/19/2020 Next Visit: 06/04/2021 Last Refill: 03/25/2021 #60 and 1RF  Please Advise

## 2021-06-04 ENCOUNTER — Ambulatory Visit (INDEPENDENT_AMBULATORY_CARE_PROVIDER_SITE_OTHER): Payer: BC Managed Care – PPO | Admitting: Family Medicine

## 2021-06-04 ENCOUNTER — Encounter: Payer: Self-pay | Admitting: Family Medicine

## 2021-06-04 ENCOUNTER — Other Ambulatory Visit: Payer: Self-pay

## 2021-06-04 VITALS — BP 122/84 | HR 70 | Temp 98.0°F | Resp 16 | Ht 67.0 in | Wt 253.0 lb

## 2021-06-04 DIAGNOSIS — R739 Hyperglycemia, unspecified: Secondary | ICD-10-CM | POA: Diagnosis not present

## 2021-06-04 DIAGNOSIS — E349 Endocrine disorder, unspecified: Secondary | ICD-10-CM | POA: Diagnosis not present

## 2021-06-04 DIAGNOSIS — M2550 Pain in unspecified joint: Secondary | ICD-10-CM

## 2021-06-04 DIAGNOSIS — E038 Other specified hypothyroidism: Secondary | ICD-10-CM

## 2021-06-04 DIAGNOSIS — Z0000 Encounter for general adult medical examination without abnormal findings: Secondary | ICD-10-CM | POA: Diagnosis not present

## 2021-06-04 DIAGNOSIS — R351 Nocturia: Secondary | ICD-10-CM | POA: Diagnosis not present

## 2021-06-04 DIAGNOSIS — E782 Mixed hyperlipidemia: Secondary | ICD-10-CM | POA: Diagnosis not present

## 2021-06-04 DIAGNOSIS — R001 Bradycardia, unspecified: Secondary | ICD-10-CM

## 2021-06-04 DIAGNOSIS — Z79899 Other long term (current) drug therapy: Secondary | ICD-10-CM

## 2021-06-04 DIAGNOSIS — G4733 Obstructive sleep apnea (adult) (pediatric): Secondary | ICD-10-CM

## 2021-06-04 LAB — CBC WITH DIFFERENTIAL/PLATELET
Basophils Absolute: 0 10*3/uL (ref 0.0–0.1)
Basophils Relative: 0.6 % (ref 0.0–3.0)
Eosinophils Absolute: 0.2 10*3/uL (ref 0.0–0.7)
Eosinophils Relative: 2 % (ref 0.0–5.0)
HCT: 47.8 % (ref 39.0–52.0)
Hemoglobin: 16.1 g/dL (ref 13.0–17.0)
Lymphocytes Relative: 23.5 % (ref 12.0–46.0)
Lymphs Abs: 1.9 10*3/uL (ref 0.7–4.0)
MCHC: 33.6 g/dL (ref 30.0–36.0)
MCV: 89.4 fl (ref 78.0–100.0)
Monocytes Absolute: 1 10*3/uL (ref 0.1–1.0)
Monocytes Relative: 12.6 % — ABNORMAL HIGH (ref 3.0–12.0)
Neutro Abs: 5 10*3/uL (ref 1.4–7.7)
Neutrophils Relative %: 61.3 % (ref 43.0–77.0)
Platelets: 226 10*3/uL (ref 150.0–400.0)
RBC: 5.34 Mil/uL (ref 4.22–5.81)
RDW: 13.9 % (ref 11.5–15.5)
WBC: 8.2 10*3/uL (ref 4.0–10.5)

## 2021-06-04 LAB — LIPID PANEL
Cholesterol: 182 mg/dL (ref 0–200)
HDL: 35.2 mg/dL — ABNORMAL LOW (ref >39.00)
LDL Cholesterol: 111 mg/dL — ABNORMAL HIGH (ref 0–99)
NonHDL: 147.23
Total CHOL/HDL Ratio: 5
Triglycerides: 183 mg/dL — ABNORMAL HIGH (ref 0.0–149.0)
VLDL: 36.6 mg/dL (ref 0.0–40.0)

## 2021-06-04 LAB — COMPREHENSIVE METABOLIC PANEL
ALT: 39 U/L (ref 0–53)
AST: 26 U/L (ref 0–37)
Albumin: 4.4 g/dL (ref 3.5–5.2)
Alkaline Phosphatase: 30 U/L — ABNORMAL LOW (ref 39–117)
BUN: 21 mg/dL (ref 6–23)
CO2: 23 mEq/L (ref 19–32)
Calcium: 9.6 mg/dL (ref 8.4–10.5)
Chloride: 105 mEq/L (ref 96–112)
Creatinine, Ser: 0.93 mg/dL (ref 0.40–1.50)
GFR: 91.92 mL/min (ref >60.00)
Glucose, Bld: 93 mg/dL (ref 70–99)
Potassium: 4.3 mEq/L (ref 3.5–5.1)
Sodium: 137 mEq/L (ref 135–145)
Total Bilirubin: 0.7 mg/dL (ref 0.2–1.2)
Total Protein: 7.3 g/dL (ref 6.0–8.3)

## 2021-06-04 LAB — TSH: TSH: 2.93 u[IU]/mL (ref 0.35–5.50)

## 2021-06-04 LAB — PSA: PSA: 0.68 ng/mL (ref 0.10–4.00)

## 2021-06-04 LAB — HEMOGLOBIN A1C: Hgb A1c MFr Bld: 5.8 % (ref 4.6–6.5)

## 2021-06-04 LAB — TESTOSTERONE: Testosterone: 1113.19 ng/dL — ABNORMAL HIGH (ref 300.00–890.00)

## 2021-06-04 NOTE — Progress Notes (Signed)
Patient ID: Dustin Spears, male    DOB: [birth year]  Age: 55 y.o. MRN: [id number]    Subjective:  Subjective  HPI Dustin Spears presents for office visit today for comprehensive physical exam today and follow up on management of chronic concerns. He states that he is doing well and has no recent hospitalizations or ER visits to report. Denies CP/palp/SOB/HA/congestion/fevers/GI or GU c/o. Taking meds as prescribed. 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 He reports that he does around 3-4 miles everyday of walking. He states that he still notices snoring even with using his CPAP and especially when sleeping on his back. He notes that he has also has gained some weight. He reports that he takes diclofenac 75 mg every night and if he skips a couple of days, his pain prevents him from moving.  Review of Systems  Constitutional:  Positive for fatigue. Negative for chills and fever.  HENT:  Negative for congestion, rhinorrhea, sinus pressure, sinus pain, sore throat and trouble swallowing.   Eyes:  Negative for pain.  Respiratory:  Negative for cough and shortness of breath.   Cardiovascular:  Negative for chest pain, palpitations and leg swelling.  Gastrointestinal:  Negative for abdominal pain, blood in stool, diarrhea, nausea and vomiting.  Genitourinary:  Negative for flank pain, frequency and penile pain.  Musculoskeletal:  Negative for back pain.  Neurological:  Negative for headaches.   History Past Medical History:  Diagnosis Date   ALLERGIC RHINITIS 03/12/2010   Allergy    seasonal   Bradycardia 01/11/2017   FATIGUE 03/12/2010   Headache(784.0) 03/12/2010   History of chicken pox    Hyperglycemia 07/10/2016   Hyperlipidemia    Knee pain, right    Morbid obesity (HCC) 02/26/2011   Morbid obesity (HCC) 02/26/2011   Neck pain, acute 03/12/2011   Overweight(278.02) 02/26/2011   Preventative health care 11/04/2012   RLS (restless legs syndrome) 01/08/2017    SLEEP APNEA, OBSTRUCTIVE 04/24/2010   SNORING 03/12/2010   Sports physical 07/16/2012   Tendonitis 05/19/2012   Testosterone deficiency 10/19/2011   Thyroid disease     He has a past surgical history that includes bicep tendon in left arm (2000).   His family history includes Alcohol abuse in his father; Alzheimer's disease in his maternal grandfather; Arthritis in his father and maternal grandmother; Cataracts in his mother; Hyperlipidemia in his mother; Lupus in his mother and paternal grandmother; Ulcers in his maternal grandmother.He reports that he has never smoked. He has never used smokeless tobacco. He reports current alcohol use. He reports that he does not use drugs.  Current Outpatient Medications on File Prior to Visit  Medication Sig Dispense Refill   Calcium Citrate-Vitamin D 200-250 MG-UNIT TABS Take 1 capsule by mouth daily. 120 tablet 0   cetirizine (ZYRTEC) 10 MG tablet Take 10 mg by mouth daily. Reported on 01/03/2016     diclofenac (VOLTAREN) 75 MG EC tablet Take 1 tablet (75 mg total) by mouth 2 (two) times daily as needed for moderate pain. 180 tablet 1   fish oil-omega-3 fatty acids 1000 MG capsule Take 2 g by mouth daily.     levothyroxine (SYNTHROID) 50 MCG tablet TAKE 1 TABLET BY MOUTH EVERY DAY 90 tablet 2   Misc. Devices (NASADOCK) MISC by Does not apply route.     rosuvastatin (CRESTOR) 5 MG tablet TAKE 1 TABLET (5 MG TOTAL) BY MOUTH DAILY. 90 tablet 1   Testosterone  10 MG/ACT (2%) GEL APPLY 4 PUMPS TO SKIN DAILY 60 g 1   traMADol (ULTRAM) 50 MG tablet Take 1 tablet (50 mg total) by mouth every 12 (twelve) hours as needed. 30 tablet 0   Wheat Dextrin (BENEFIBER) POWD 2 tsp po daily in liquids 730 g 0   No current facility-administered medications on file prior to visit.     Objective:  Objective  Physical Exam Constitutional:      General: He is not in acute distress.    Appearance: Normal appearance. He is not ill-appearing or toxic-appearing.  HENT:      Head: Normocephalic and atraumatic.     Right Ear: Tympanic membrane, ear canal and external ear normal.     Left Ear: Tympanic membrane, ear canal and external ear normal.     Nose: No congestion or rhinorrhea.  Eyes:     Extraocular Movements: Extraocular movements intact.     Right eye: No nystagmus.     Left eye: No nystagmus.     Pupils: Pupils are equal, round, and reactive to light.  Cardiovascular:     Rate and Rhythm: Normal rate and regular rhythm.     Pulses: Normal pulses.     Heart sounds: Normal heart sounds. No murmur heard. Pulmonary:     Effort: Pulmonary effort is normal. No respiratory distress.     Breath sounds: Normal breath sounds. No wheezing, rhonchi or rales.  Abdominal:     General: Bowel sounds are normal.     Palpations: Abdomen is soft. There is no mass.     Tenderness: no abdominal tenderness There is no guarding.     Hernia: No hernia is present.  Musculoskeletal:        General: Normal range of motion.     Cervical back: Normal range of motion and neck supple.  Skin:    General: Skin is warm and dry.  Neurological:     Mental Status: He is alert and oriented to person, place, and time.     Cranial Nerves: No facial asymmetry.     Motor: Motor function is intact. No weakness.  Psychiatric:        Behavior: Behavior normal.   BP 122/84   Pulse 70   Temp 98 F (36.7 C)   Resp 16   Ht 5\' 7"  (1.702 m)   Wt 253 lb (114.8 kg)   SpO2 98%   BMI 39.63 kg/m  Wt Readings from Last 3 Encounters:  06/04/21 253 lb (114.8 kg)  11/19/20 256 lb 6.4 oz (116.3 kg)  07/30/18 247 lb (112 kg)     Lab Results  Component Value Date   WBC 8.2 06/04/2021   HGB 16.1 06/04/2021   HCT 47.8 06/04/2021   PLT 226.0 06/04/2021   GLUCOSE 93 06/04/2021   CHOL 182 06/04/2021   TRIG 183.0 (H) 06/04/2021   HDL 35.20 (L) 06/04/2021   LDLDIRECT 185.0 11/19/2020   LDLCALC 111 (H) 06/04/2021   ALT 39 06/04/2021   AST 26 06/04/2021   NA 137 06/04/2021   K 4.3  06/04/2021   CL 105 06/04/2021   CREATININE 0.93 06/04/2021   BUN 21 06/04/2021   CO2 23 06/04/2021   TSH 2.93 06/04/2021   PSA 0.68 06/04/2021   HGBA1C 5.8 06/04/2021    DG Eye Foreign Body  Result Date: 02/07/2015 CLINICAL DATA:  Metal working/exposure; clearance prior to MRI EXAM: ORBITS FOR FOREIGN BODY - 2 VIEW COMPARISON:  None. FINDINGS: No radiopaque  foreign body.  Ok to proceed to MRI. IMPRESSION: No radiopaque foreign body.  Ok to proceed to MRI. Electronically Signed   By: Simonne Come M.D.   On: 02/07/2015 07:51     Assessment & Plan:  Plan    No orders of the defined types were placed in this encounter.   Problem List Items Addressed This Visit     Hypothyroidism - Primary    On Levothyroxine, continue to monitor      Relevant Orders   TSH (Completed)   Sleep apnea    Diagnosed with neurology and having trouble with his CPAP presently. Referred back for further evaluation.       Relevant Orders   Ambulatory referral to Neurology   Hyperlipidemia    Encourage heart healthy diet such as MIND or DASH diet, increase exercise, avoid trans fats, simple carbohydrates and processed foods, consider a krill or fish or flaxseed oil cap daily. Tolerating Crestor      Relevant Orders   CBC with Differential/Platelet (Completed)   Comprehensive metabolic panel (Completed)   Lipid panel (Completed)   Morbid obesity (HCC)    Encouraged DASH or MIND diet, decrease po intake and increase exercise as tolerated. Needs 7-8 hours of sleep nightly. Avoid trans fats, eat small, frequent meals every 4-5 hours with lean proteins, complex carbs and healthy fats. Minimize simple carbs, high fat foods and processed foods. Consider [phone] and Saxenda      Testosterone deficiency    Levels elevated today but he had just taken the shot 1 hour prior to blood draw so will repeat in 3 months.      Relevant Orders   Testosterone (Completed)   PSA (Completed)   Preventative health care     Patient encouraged to maintain heart healthy diet, regular exercise, adequate sleep. Consider daily probiotics. Take medications as prescribed. Labs ordered and reviewed      Relevant Orders   PSA (Completed)   Arthralgia    No specific complaints but ongoing stiffness and joint pains. Encouraged moist heat and gentle stretching as tolerated. May try NSAIDs and prescription meds as directed and report if symptoms worsen or seek immediate care      Hyperglycemia    hgba1c acceptable, minimize simple carbs. Increase exercise as tolerated.       Relevant Orders   Hemoglobin A1c (Completed)   Bradycardia    RRR today      Other Visit Diagnoses     High risk medication use       Relevant Orders   DRUG MONITORING, PANEL 8 WITH CONFIRMATION, URINE (Completed)   Nocturia       Relevant Orders   PSA (Completed)       Follow-up: Return in about 6 months (around 12/05/2021).  I, Billie Lade, acting as a scribe for [name], MD, have documented all relevent documentation on behalf of [name], MD, as directed by [name], MD while in the presence of [name], MD.  I, Bradd Canary, MD personally performed the services described in this documentation. All medical record entries made by the scribe were at my direction and in my presence. I have reviewed the chart and agree that the record reflects my personal performance and is accurate and complete

## 2021-06-04 NOTE — Patient Instructions (Addendum)
Shingrix is the new shingles shot, 2 shots over 2-6 months, confirm coverage with insurance and document, then can return here for shots with nurse appt or at pharmacy   [facility] or [facility] are injectables for weight loss let us know if you want to try one and check with insurance about which one they prefer  MIND DIET   Paxlovid is the new COVID medication we can give you if you get COVID so make sure you test if you have symptoms because we have to treat by day 5 of symptoms for it to be effective. If you are positive let us know so we can treat. If a home test is negative and your symptoms are persistent get a PCR test. Can check testing locations at [email or web address] If you are positive we will make an appointment with [place] and we will send in Paxlovid if you would like it. Check with your pharmacy before we meet to confirm they have it in stock, if they do not then we can get the prescription at the [facility]   Preventive Care 76-45 Years Old, Male Preventive care refers to lifestyle choices and visits with your health care provider that can promote health and wellness. This includes: A yearly physical exam. This is also called an annual wellness visit. Regular dental and eye exams. Immunizations. Screening for certain conditions. Healthy lifestyle choices, such as: Eating a healthy diet. Getting regular exercise. Not using drugs or products that contain nicotine and tobacco. Limiting alcohol use. What can I expect for my preventive care visit? Physical exam Your health care provider will check your: Height and weight. These may be used to calculate your BMI (body mass index). BMI is a measurement that tells if you are at a healthy weight. Heart rate and blood pressure. Body temperature. Skin for abnormal spots. Counseling Your health care provider may ask you questions about your: Past medical problems. Family's medical history. Alcohol, tobacco, and drug  use. Emotional well-being. Home life and relationship well-being. Sexual activity. Diet, exercise, and sleep habits. 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 Access to firearms. What immunizations do I need?  Vaccines are usually given at various ages, according to a schedule. Your health care provider will recommend vaccines for you based on your age, medicalhistory, and lifestyle or other factors, such as travel or where you work. What tests do I need? Blood tests Lipid and cholesterol levels. These may be checked every 5 years, or more often if you are over 45 years old. Hepatitis C test. Hepatitis B test. Screening Lung cancer screening. You may have this screening every year starting at age 90 if you have a 30-pack-year history of smoking and currently smoke or have quit within the past 15 years. Prostate cancer screening. Recommendations will vary depending on your family history and other risks. Genital exam to check for testicular cancer or hernias. Colorectal cancer screening. All adults should have this screening starting at age 76 and continuing until age 48. Your health care provider may recommend screening at age 32 if you are at increased risk. You will have tests every 1-10 years, depending on your results and the type of screening test. Diabetes screening. This is done by checking your blood sugar (glucose) after you have not eaten for a while (fasting). You may have this done every 1-3 years. STD (sexually transmitted disease) testing, if you are at risk. Follow these instructions at home: Eating and drinking  Eat a diet that includes fresh fruits and  vegetables, whole grains, lean protein, and low-fat dairy products. Take vitamin and mineral supplements as recommended by your health care provider. Do not drink alcohol if your health care provider tells you not to drink. If you drink alcohol: Limit how much you have to 0-2 drinks a day. Be aware of how much alcohol is in your  drink. In the [place] one drink equals one 12 oz bottle of beer (355 mL), one 5 oz glass of wine (148 mL), or one 1 oz glass of hard liquor (44 mL).  Lifestyle Take daily care of your teeth and gums. Brush your teeth every morning and night with fluoride toothpaste. Floss one time each day. Stay active. Exercise for at least 30 minutes 5 or more days each week. Do not use any products that contain nicotine or tobacco, such as cigarettes, e-cigarettes, and chewing tobacco. If you need help quitting, ask your health care provider. Do not use drugs. If you are sexually active, practice safe sex. Use a condom or other form of protection to prevent STIs (sexually transmitted infections). If told by your health care provider, take low-dose aspirin daily starting at age 47. Find healthy ways to cope with stress, such as: Meditation, yoga, or listening to music. Journaling. Talking to a trusted person. Spending time with friends and family. Safety Always wear your seat belt while driving or riding in a vehicle. Do not drive: If you have been drinking alcohol. Do not ride with someone who has been drinking. When you are tired or distracted. While texting. Wear a helmet and other protective equipment during sports activities. If you have firearms in your house, make sure you follow all gun safety procedures. What's next? Go to your health care provider once a year for an annual wellness visit. Ask your health care provider how often you should have your eyes and teeth checked. Stay up to date on all vaccines. This information is not intended to replace advice given to you by your health care provider. Make sure you discuss any questions you have with your healthcare provider. Document Revised: 07/05/2019 Document Reviewed: 09/30/2018 Elsevier Patient Education  2022 [employer].

## 2021-06-06 ENCOUNTER — Encounter: Payer: Self-pay | Admitting: Family Medicine

## 2021-06-06 DIAGNOSIS — E349 Endocrine disorder, unspecified: Secondary | ICD-10-CM

## 2021-06-06 LAB — DRUG MONITORING, PANEL 8 WITH CONFIRMATION, URINE
6 Acetylmorphine: NEGATIVE ng/mL (ref <10)
Alcohol Metabolites: POSITIVE ng/mL — AB (ref <500)
Amphetamines: NEGATIVE ng/mL (ref <500)
Benzodiazepines: NEGATIVE ng/mL (ref <100)
Buprenorphine, Urine: NEGATIVE ng/mL (ref <5)
Cocaine Metabolite: NEGATIVE ng/mL (ref <150)
Creatinine: 147.2 mg/dL (ref >=20.0)
Ethyl Glucuronide (ETG): 3849 ng/mL — ABNORMAL HIGH (ref <500)
Ethyl Sulfate (ETS): 615 ng/mL — ABNORMAL HIGH (ref <100)
MDMA: NEGATIVE ng/mL (ref <500)
Marijuana Metabolite: NEGATIVE ng/mL (ref <20)
Opiates: NEGATIVE ng/mL (ref <100)
Oxidant: NEGATIVE ug/mL (ref <200)
Oxycodone: NEGATIVE ng/mL (ref <100)
pH: 5.9 (ref 4.5–9.0)

## 2021-06-06 LAB — DM TEMPLATE

## 2021-06-09 NOTE — Assessment & Plan Note (Signed)
hgba1c acceptable, minimize simple carbs. Increase exercise as tolerated.  

## 2021-06-09 NOTE — Assessment & Plan Note (Signed)
No specific complaints but ongoing stiffness and joint pains. Encouraged moist heat and gentle stretching as tolerated. May try NSAIDs and prescription meds as directed and report if symptoms worsen or seek immediate care

## 2021-06-09 NOTE — Assessment & Plan Note (Signed)
On Levothyroxine, continue to monitor 

## 2021-06-09 NOTE — Assessment & Plan Note (Signed)
RRR today 

## 2021-06-09 NOTE — Assessment & Plan Note (Signed)
Diagnosed with neurology and having trouble with his CPAP presently. Referred back for further evaluation.

## 2021-06-09 NOTE — Assessment & Plan Note (Signed)
Levels elevated today but he had just taken the shot 1 hour prior to blood draw so will repeat in 3 months.

## 2021-06-09 NOTE — Assessment & Plan Note (Addendum)
Encourage heart healthy diet such as MIND or DASH diet, increase exercise, avoid trans fats, simple carbohydrates and processed foods, consider a krill or fish or flaxseed oil cap daily. Tolerating Crestor 

## 2021-06-09 NOTE — Assessment & Plan Note (Signed)
Encouraged DASH or MIND diet, decrease po intake and increase exercise as tolerated. Needs 7-8 hours of sleep nightly. Avoid trans fats, eat small, frequent meals every 4-5 hours with lean proteins, complex carbs and healthy fats. Minimize simple carbs, high fat foods and processed foods. Consider [phone] and Saxenda

## 2021-06-09 NOTE — Assessment & Plan Note (Signed)
Patient encouraged to maintain heart healthy diet, regular exercise, adequate sleep. Consider daily probiotics. Take medications as prescribed. Labs ordered and reviewed 

## 2021-06-25 ENCOUNTER — Other Ambulatory Visit: Payer: Self-pay | Admitting: Family Medicine

## 2021-06-25 DIAGNOSIS — G8929 Other chronic pain: Secondary | ICD-10-CM

## 2021-06-25 DIAGNOSIS — M25561 Pain in right knee: Secondary | ICD-10-CM

## 2021-06-26 NOTE — Telephone Encounter (Signed)
Requesting: tramadol Contract: 06/04/21 UDS: 06/04/21 Last Visit: 06/04/21 Next Visit: 12/05/21 Last Refill: 11/19/20  Please Advise

## 2021-07-18 DIAGNOSIS — M7732 Calcaneal spur, left foot: Secondary | ICD-10-CM | POA: Diagnosis not present

## 2021-07-18 DIAGNOSIS — G5762 Lesion of plantar nerve, left lower limb: Secondary | ICD-10-CM | POA: Diagnosis not present

## 2021-07-18 DIAGNOSIS — M79672 Pain in left foot: Secondary | ICD-10-CM | POA: Diagnosis not present

## 2021-07-18 DIAGNOSIS — M722 Plantar fascial fibromatosis: Secondary | ICD-10-CM | POA: Diagnosis not present

## 2021-07-30 ENCOUNTER — Other Ambulatory Visit: Payer: Self-pay | Admitting: Family Medicine

## 2021-08-22 ENCOUNTER — Encounter: Payer: Self-pay | Admitting: Neurology

## 2021-08-22 ENCOUNTER — Other Ambulatory Visit: Payer: Self-pay

## 2021-08-22 ENCOUNTER — Ambulatory Visit: Payer: BC Managed Care – PPO | Admitting: Neurology

## 2021-08-22 VITALS — BP 123/72 | HR 63 | Ht 69.0 in | Wt 249.0 lb

## 2021-08-22 DIAGNOSIS — R635 Abnormal weight gain: Secondary | ICD-10-CM | POA: Diagnosis not present

## 2021-08-22 DIAGNOSIS — Z9989 Dependence on other enabling machines and devices: Secondary | ICD-10-CM | POA: Diagnosis not present

## 2021-08-22 DIAGNOSIS — R0683 Snoring: Secondary | ICD-10-CM

## 2021-08-22 DIAGNOSIS — G4733 Obstructive sleep apnea (adult) (pediatric): Secondary | ICD-10-CM

## 2021-08-22 NOTE — Patient Instructions (Signed)
It was nice to see you again today.  You are fully compliant with your CPAP machine.  Given that you have had some recurrence of snoring especially with sleeping on your back and you had some weight fluctuation, we can try to increase your treatment pressure from 9 cm to 10 cm at this time.  We will make the change on the [facility] website wirelessly.  Keep up the good work!  We can see you back in a year, you can schedule a follow-up appointment with one of our nurse practitioners.    Please continue using your CPAP regularly. While your insurance requires that you use CPAP at least 4 hours each night on 70% of the nights, I recommend, that you not skip any nights and use it throughout the night if you can. Getting used to CPAP and staying with the treatment long term does take time and patience and discipline. Untreated obstructive sleep apnea when it is moderate to severe can have an adverse impact on cardiovascular health and raise her risk for heart disease, arrhythmias, hypertension, congestive heart failure, stroke and diabetes. Untreated obstructive sleep apnea causes sleep disruption, nonrestorative sleep, and sleep deprivation. This can have an impact on your day to day functioning and cause daytime sleepiness and impairment of cognitive function, memory loss, mood disturbance, and problems focussing. Using CPAP regularly can improve these symptoms.

## 2021-08-22 NOTE — Progress Notes (Signed)
Subjective:    Patient ID: Dustin Spears is a 55 y.o. male.  HPI    Star Age, MD, PhD [facility] [address], [address] [place], [place] [address]  Dear Dr. Charlett Blake,   I saw your patient, Daimon Kean, upon your kind request, in my Sleep clinic today for re-evaluation of his obstructive sleep apnea.  The patient is unaccompanied today.  I have previously followed him for sleep apnea and he was last seen over 3 years ago in this clinic. As you know, Mr. Lohse is a 55 year old right-handed gentleman with an underlying medical history of bradycardia, headaches, allergic rhinitis, hyperlipidemia, restless leg syndrome, sleep apnea, knee pain, and obesity, who reports worsening snoring and daytime somnolence.  He has been on CPAP therapy with compliance.  He has had some weight gain.  I reviewed your office note from 06/04/2021.  His Epworth sleepiness score is 5 out of 24, fatigue severity score is 15 out of 63.  He is consistent with his CPAP use.  He has been told that he has snoring when he sleeps on his back.  This is per wife's feedback.  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  He is consistent with his sleep schedule.  During the workweek he goes to bed around 11 and rise time is around 5:30 AM.  He tries to sleep and on his days off.  He works as a Educational psychologist.  He lives with his wife, he has a grown daughter and a 54-year-old grandson.  He gets his supplies online typically.  He has switched to a Quattro fullface mask from [employer], size medium.  Keeps up-to-date with his filter changes and tubing changes.  He he humidifier in the machine and cleans his mask regularly, change his supplies on a regular basis as well.  He drinks coffee, sometimes throughout the day, up to 6 cups.  He drinks alcohol very occasionally, he is a non-smoker.  I reviewed his CPAP compliance data from from the past  90 days from 05/24/2021 through 08/21/2021, during which time he used his machine every night with percent use days greater than 4 hours at 100%, indicating superb compliance, average usage of 7 hours and 42 minutes which is excellent, residual AHI is borderline at 4.8/h, leak on the higher side with a 95th percentile at 24.9 L/min of the pressure of 9 cm.  In the past 30 days his compliance is similar, AHI 3.3/h, leak similar.  Of note, he does have a full beard.    Previously (copied from previous notes for reference):   07/14/2018: 55 year old right-handed gentleman with an underlying medical history of allergic rhinitis, hyperlipidemia, restless leg syndrome, thyroid disease, low testosterone, neck pain and obesity, who presents for follow-up consultation of his obstructive sleep apnea after reevaluation with a sleep study. The patient is unaccompanied today. I first met him on 03/17/2018 at the request of his primary care physician, at which time he reported a prior diagnosis of obstructive sleep apnea. He needed new equipment and reevaluation after several years. He had some restless leg symptoms. He had a split-night sleep study in the interim on 04/05/2018. I went over his test results with him in detail today. Baseline sleep efficiency was 88.5%, he had absence of REM sleep. Total AHI was in the severe range at 42.3 per hour, average oxygen saturation 92%, nadir was 73%. He was titrated  on CPAP with a full facemask. On the final titration pressure of 8 cm his AHI was 0.4 per hour, with supine REM sleep achieved an O2 nadir of 88%. I suggested a home treatment pressure of 9 cm. 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    I reviewed his CPAP compliance data from 06/14/2018 through 07/13/2018 which is a total of 30 days, during which time he used his CPAP every night with percent used days greater than 4 hours at 100%,  indicating superb compliance with an average usage of 7 hours and 24 minutes, residual AHI at goal at 1.5 per hour, leak on the high side with the 95th percentile at 39 L/m on a pressure of 9 cm. He reports that his new machine is working well. It is quieter than his old machine. He has noticed a little bit of mask leak issues. Attributes this to his full beard. He had been out of work on short-term disability after an injury to his right hand. He is been back to work for the last month. When he was at home out of work he was sleeping more. Overall, things are going well with his CPAP, he has no new complaints, does not mind the pressure of 9 cm. Has been using a fullface mask and is used to it. He has had some weight gain over the past 3 years. He is motivated to work on weight loss.     03/17/2018: (He) was previously diagnosed with obstructive sleep apnea and placed on CPAP therapy. He reports being compliant with his CPAP machine. He had prior sleep study testing in 2011. He had a split-night sleep study at [facility] on 04/09/2010 and I reviewed the results, study was interpreted by Dr. Gwenette Greet at the time. His overall AHI was 17 per hour, oxygen nadir was 85%. He was fitted with a nasal mask and CPAP was titrated to a final pressure of 8 cm. His BMI was listed as 34 at the time, weight at 230 pounds. He did not have any significant PLMS during the study. His machine is older, nearly 56 years old. A CPAP download report was reviewed today: I reviewed his compliance data from 02/15/2018 through 03/16/2018 which is a total of 30 days, during which time he was fully compliant with usage with percent used days greater than 4 hours at 100%, indicating superb compliance, average usage of 7 hours and 21 minutes, residual AHI and leak information unfortunately is not available through the card on the machine. Pressure is at 8 cm. I reviewed your office note from 01/21/2018. He has a diagnosis of restless leg  syndrome and while he does not endorse much in the way of RLS symptoms at this time, he  has leg twitching and occasional arm twitching during sleep as reported by his wife. Symptoms are intermittent, not every night, started about a year ago or so as noticed by his wife primarily. His Epworth sleepiness score is 9 out of 24 today, fatigue score is 22 out of 63. He is married and lives with his wife, they have 1 child. He is a nonsmoker and drinks alcohol maybe twice a week, caffeine in the form of coffee, 4 cups per day on average.  He does not have a history of thyroid dysfunction or anemia or iron deficiency. He is not on an antidepressant. He recently stopped his  Crestor as he had muscle aching when the dose was increased to 20 mg, he is going to restart at 10 mg strength. His leg movements are described as wrapping his feet against each other while he is asleep. He is not aware of these movements or twitching. They are somewhat bothersome to his wife when she is trying to fall asleep but once she is asleep his movements don't bother her, he is not bothered by his symptoms, he does not endorse any actual symptoms of restless leg syndrome, is not aware of any family history of RLS. He does snore some through his mask, he has had some weight gain over the past few years. Compared to 2011 he has gained about 18 pounds. His bedtime is between 10:30 and 11 PM, rise time around 5:40 AM. He has no nighttime nocturia or morning headaches.  His Past Medical History Is Significant For: Past Medical History:  Diagnosis Date   ALLERGIC RHINITIS 03/12/2010   Allergy    seasonal   Bradycardia 01/11/2017   FATIGUE 03/12/2010   Headache(784.0) 03/12/2010   History of chicken pox    Hyperglycemia 07/10/2016   Hyperlipidemia    Knee pain, right    Morbid obesity ([place]) 02/26/2011   Morbid obesity ([place]) 02/26/2011   Neck pain, acute 03/12/2011   Overweight(278.02) 02/26/2011   Preventative health care 11/04/2012   RLS  (restless legs syndrome) 01/08/2017   SLEEP APNEA, OBSTRUCTIVE 04/24/2010   SNORING 03/12/2010   Sports physical 07/16/2012   Tendonitis 05/19/2012   Testosterone deficiency 10/19/2011   Thyroid disease     His Past Surgical History Is Significant For: Past Surgical History:  Procedure Laterality Date   bicep tendon in left arm  2000    His Family History Is Significant For: Family History  Problem Relation Age of Onset   Hyperlipidemia Mother    Lupus Mother    Cataracts Mother    Alcohol abuse Father        smoked   Arthritis Father    Arthritis Maternal Grandmother    Ulcers Maternal Grandmother    Alzheimer's disease Maternal Grandfather    Lupus Paternal Grandmother    Sleep apnea Neg Hx     His Social History Is Significant For: Social History   Socioeconomic History   Marital status: Married    Spouse name: Not on file   Number of children: 1   Years of education: Not on file   Highest education level: Not on file  Occupational History   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- [employer]    Employer: [employer]   Occupation: [employer]  Tobacco Use   Smoking status: Never   Smokeless tobacco: Never  Vaping Use   Vaping Use: Never used  Substance and Sexual Activity   Alcohol use: Yes    Comment: occasional   Drug use: No   Sexual activity: Yes  Other Topics Concern   Not on file  Social History Narrative   [place]   Lives at home with wife   Right handed   Caffeine: 3-4 cups/day   Social Determinants of Health   Financial Resource Strain: Not on file  Food Insecurity: Not on file  Transportation Needs: Not on file  Physical Activity: Not on file  Stress: Not on file  Social Connections: Not on file    His Allergies Are:  No Known Allergies:   His Current Medications Are:  Outpatient Encounter Medications as of  08/22/2021  Medication Sig   Calcium Citrate-Vitamin D 200-250 MG-UNIT TABS Take 1 capsule by mouth daily.   cetirizine  (ZYRTEC) 10 MG tablet Take 10 mg by mouth daily. Reported on 01/03/2016   diclofenac (VOLTAREN) 75 MG EC tablet Take 1 tablet (75 mg total) by mouth 2 (two) times daily as needed for moderate pain.   fish oil-omega-3 fatty acids 1000 MG capsule Take 2 g by mouth daily.   levothyroxine (SYNTHROID) 50 MCG tablet TAKE 1 TABLET BY MOUTH EVERY DAY   Misc. Devices ([place]) MISC by Does not apply route.   rosuvastatin (CRESTOR) 5 MG tablet TAKE 1 TABLET (5 MG TOTAL) BY MOUTH DAILY.   Testosterone 10 MG/ACT (2%) GEL APPLY 4 PUMPS TO SKIN DAILY   traMADol (ULTRAM) 50 MG tablet TAKE 1 TABLET BY MOUTH EVERY 12 HOURS AS NEEDED.   Wheat Dextrin (BENEFIBER) POWD 2 tsp po daily in liquids   No facility-administered encounter medications on file as of 08/22/2021.  :   Review of Systems:  Out of a complete 14 point review of systems, all are reviewed and negative with the exception of these symptoms as listed below:  Review of Systems  Neurological:        Patient has returned today for a sleep consult. He was last seen 3 years ago. He reports he uses his CPAP every night. He states the reason why he came back in is because for the last several months he wife told him that he's been snoring more. He also notices a sore throat if he wakes up after having slept on his back. ESS 5, FSS 15.    Objective:  Neurological Exam  Physical Exam Physical Examination:   Vitals:   08/22/21 1526  BP: 123/72  Pulse: 63    General Examination: The patient is a very pleasant 55 y.o. male in no acute distress. He appears well-developed and well-nourished and well groomed.   HEENT: Normocephalic, atraumatic, pupils are equal, round and reactive to light, corrective eyeglasses in place.  Tracking is well-preserved, hearing grossly intact.  Face is symmetric with normal facial animation.  Speech is clear without dysarthria, hypophonia or voice tremor.  Neck is supple, no carotid bruits.  Airway examination reveals no  significant mouth dryness.  Moderate airway crowding noted, tonsillar size of about 1-2+, Mallampati class I. Tongue protrudes centrally and palate elevates symmetrically.    Chest: Clear to auscultation without wheezing, rhonchi or crackles noted.   Heart: S1+S2+0, regular and normal without murmurs, rubs or gallops noted.    Abdomen: Soft, non-tender and non-distended.   Extremities: There is no obvious edema in the distal lower extremities bilaterally.   Skin: Warm and dry without trophic changes noted.   Musculoskeletal: exam reveals no obvious joint deformities but reports some joint discomfort in various areas.   Neurologically:  Mental status: The patient is awake, alert and oriented in all 4 spheres. His immediate and remote memory, attention, language skills and fund of knowledge are appropriate. There is no evidence of aphasia, agnosia, apraxia or anomia. Speech is clear with normal prosody and enunciation. Thought process is linear. Mood is normal and affect is normal.  Cranial nerves II - XII are as described above under HEENT exam.  Motor exam: Normal bulk, strength and tone is noted. There is no tremor. Fine motor skills and coordination: intact grossly.   Cerebellar testing: No dysmetria or intention tremor. There is no truncal or gait ataxia.  Sensory exam: intact to  light touch.  Gait, station and balance: He stands easily. No veering to one side is noted. No leaning to one side is noted. Posture is age-appropriate and stance is narrow based. Gait shows normal stride length and normal pace. No problems turning are noted.   Assessment and Plan:  In summary, Keyaan Lederman is a very pleasant 55 year old male with an underlying medical history of bradycardia, headaches, allergic rhinitis, hyperlipidemia, restless leg syndrome, sleep apnea, knee pain, and obesity, who presents for reevaluation of his obstructive sleep apnea.  He was originally diagnosed in 2011, we did a split-night  sleep study in 2019.  He had been compliant with treatment.  He continues to be fully compliant with his CPAP of 9 cm but has noticed some recurrence of snoring and his residual AHI is at times borderline.  He has had some weight fluctuation.  We mutually agreed to increase his CPAP pressure from 9 cm to 10 cm at this time.  Previously, he was on a pressure of 8 cm several years ago.  He is encouraged to continue to work on weight loss.  He is commended for his full treatment adherence.  He is benefiting from treatment and is very motivated to continue with them.  He has typically ordered his supplies online.  I advised him that we could try to change the pressure remotely through the [facility] website.  He is advised to follow-up routinely to see one of our nurse practitioners for sleep apnea checkup in 1 year in this clinic.  We can also offer him a virtual visit at the time if he would prefer.  I answered all his questions today and he was in agreement with our plan.  He is encouraged to call us with any interim questions or concerns or if he would like to go back to the pressure of 9 cm we can certainly make that happen.  I explained to him that he is not quite eligible for a new machine through his insurance.  He reports that his current machine is working well which is reassuring. Thank you very much for allowing me to participate in the care of this nice patient. If I can be of any further assistance to you please do not hesitate to call me at [phone].   Sincerely,     Star Age, MD, PhD

## 2021-09-16 ENCOUNTER — Other Ambulatory Visit (INDEPENDENT_AMBULATORY_CARE_PROVIDER_SITE_OTHER): Payer: BC Managed Care – PPO

## 2021-09-16 ENCOUNTER — Other Ambulatory Visit: Payer: Self-pay

## 2021-09-16 DIAGNOSIS — E349 Endocrine disorder, unspecified: Secondary | ICD-10-CM

## 2021-09-17 LAB — TESTOSTERONE: Testosterone: 604.13 ng/dL (ref 300.00–890.00)

## 2021-09-28 ENCOUNTER — Other Ambulatory Visit: Payer: Self-pay | Admitting: Family Medicine

## 2021-11-09 ENCOUNTER — Other Ambulatory Visit: Payer: Self-pay | Admitting: Family Medicine

## 2021-12-05 ENCOUNTER — Ambulatory Visit: Payer: BC Managed Care – PPO | Admitting: Family Medicine

## 2021-12-05 ENCOUNTER — Other Ambulatory Visit: Payer: Self-pay | Admitting: Family Medicine

## 2021-12-05 NOTE — Telephone Encounter (Signed)
Requesting:Testosterone  Contract:06/13/21 UDS:06/04/21 Last Visit:06/04/21 Next Visit:12/05/21 Appt cancel Last Refill:09/16/21  Please Advise

## 2021-12-22 ENCOUNTER — Other Ambulatory Visit: Payer: Self-pay | Admitting: Family Medicine

## 2021-12-22 DIAGNOSIS — G8929 Other chronic pain: Secondary | ICD-10-CM

## 2021-12-23 NOTE — Telephone Encounter (Signed)
Requesting: Tramadol 50MG  ?Contract:  12/14/20 ?UDS: 05/04/21 ?Last Visit: 06/04/21 ?Next Visit: not scheduled ?Last Refill: 06/26/21, #30, 0 refills ? ?Please Advise  ?

## 2022-01-15 ENCOUNTER — Telehealth: Payer: Self-pay | Admitting: Family Medicine

## 2022-01-15 DIAGNOSIS — M25561 Pain in right knee: Secondary | ICD-10-CM

## 2022-01-15 MED ORDER — DICLOFENAC SODIUM 75 MG PO TBEC: 75.0000 mg | DELAYED_RELEASE_TABLET | Freq: Two times a day (BID) | ORAL | 1 refills | Status: DC | PRN

## 2022-01-15 NOTE — Telephone Encounter (Signed)
Refill sent.

## 2022-01-15 NOTE — Telephone Encounter (Signed)
Medication: diclofenac (VOLTAREN) 75 MG EC tablet  ? ?Has the patient contacted their pharmacy? Yes.   ? ?Preferred Pharmacy (with phone number or street name): \ ? ?[id number] - [place], [place] - [address]  ?[address] [place] [address]  ?Phone:  [phone]  Fax:  [phone]  ?Agent: Please be advised that RX refills may take up to 3 business days. We ask that you follow-up with your pharmacy.  ?

## 2022-02-08 ENCOUNTER — Other Ambulatory Visit: Payer: Self-pay | Admitting: Family Medicine

## 2022-02-10 ENCOUNTER — Telehealth: Payer: Self-pay | Admitting: *Deleted

## 2022-02-10 NOTE — Telephone Encounter (Signed)
Left message on machine for patient to call back and schedule follow up visit. ?

## 2022-02-10 NOTE — Telephone Encounter (Signed)
Requesting: testosterone ?Contract:  ?UDS: 06/04/21 ?Last Visit: 06/04/21 ?Next Visit: none ?Last Refill: 12/05/21 ? ?Left message for patient to call us back to schedule follow up visit. ? ?Please Advise ? ?

## 2022-02-21 ENCOUNTER — Encounter: Payer: Self-pay | Admitting: Family Medicine

## 2022-02-23 ENCOUNTER — Encounter: Payer: Self-pay | Admitting: Family Medicine

## 2022-02-25 ENCOUNTER — Ambulatory Visit: Payer: BC Managed Care – PPO | Admitting: Family Medicine

## 2022-02-25 ENCOUNTER — Encounter: Payer: Self-pay | Admitting: Family Medicine

## 2022-02-25 VITALS — BP 132/86 | HR 56 | Resp 20 | Ht 69.0 in | Wt 261.0 lb

## 2022-02-25 DIAGNOSIS — Z79899 Other long term (current) drug therapy: Secondary | ICD-10-CM | POA: Diagnosis not present

## 2022-02-25 DIAGNOSIS — M2550 Pain in unspecified joint: Secondary | ICD-10-CM | POA: Diagnosis not present

## 2022-02-25 DIAGNOSIS — E782 Mixed hyperlipidemia: Secondary | ICD-10-CM

## 2022-02-25 DIAGNOSIS — E038 Other specified hypothyroidism: Secondary | ICD-10-CM

## 2022-02-25 DIAGNOSIS — R5382 Chronic fatigue, unspecified: Secondary | ICD-10-CM | POA: Diagnosis not present

## 2022-02-25 DIAGNOSIS — E349 Endocrine disorder, unspecified: Secondary | ICD-10-CM

## 2022-02-25 DIAGNOSIS — R739 Hyperglycemia, unspecified: Secondary | ICD-10-CM

## 2022-02-25 DIAGNOSIS — M25561 Pain in right knee: Secondary | ICD-10-CM

## 2022-02-25 DIAGNOSIS — G8929 Other chronic pain: Secondary | ICD-10-CM

## 2022-02-25 LAB — LIPID PANEL
Cholesterol: 143 mg/dL (ref 0–200)
HDL: 35.8 mg/dL — ABNORMAL LOW (ref >39.00)
LDL Cholesterol: 69 mg/dL (ref 0–99)
NonHDL: 107.42
Total CHOL/HDL Ratio: 4
Triglycerides: 190 mg/dL — ABNORMAL HIGH (ref 0.0–149.0)
VLDL: 38 mg/dL (ref 0.0–40.0)

## 2022-02-25 LAB — HEMOGLOBIN A1C: Hgb A1c MFr Bld: 5.9 % (ref 4.6–6.5)

## 2022-02-25 LAB — COMPREHENSIVE METABOLIC PANEL
ALT: 46 U/L (ref 0–53)
AST: 32 U/L (ref 0–37)
Albumin: 4.4 g/dL (ref 3.5–5.2)
Alkaline Phosphatase: 31 U/L — ABNORMAL LOW (ref 39–117)
BUN: 19 mg/dL (ref 6–23)
CO2: 23 mEq/L (ref 19–32)
Calcium: 8.9 mg/dL (ref 8.4–10.5)
Chloride: 103 mEq/L (ref 96–112)
Creatinine, Ser: 1 mg/dL (ref 0.40–1.50)
GFR: 83.82 mL/min (ref >60.00)
Glucose, Bld: 91 mg/dL (ref 70–99)
Potassium: 4.2 mEq/L (ref 3.5–5.1)
Sodium: 135 mEq/L (ref 135–145)
Total Bilirubin: 0.7 mg/dL (ref 0.2–1.2)
Total Protein: 7.2 g/dL (ref 6.0–8.3)

## 2022-02-25 LAB — CBC
HCT: 46.3 % (ref 39.0–52.0)
Hemoglobin: 15.5 g/dL (ref 13.0–17.0)
MCHC: 33.4 g/dL (ref 30.0–36.0)
MCV: 89.7 fl (ref 78.0–100.0)
Platelets: 220 10*3/uL (ref 150.0–400.0)
RBC: 5.16 Mil/uL (ref 4.22–5.81)
RDW: 13.4 % (ref 11.5–15.5)
WBC: 5.9 10*3/uL (ref 4.0–10.5)

## 2022-02-25 LAB — TSH: TSH: 3.01 u[IU]/mL (ref 0.35–5.50)

## 2022-02-25 LAB — TESTOSTERONE: Testosterone: 943.2 ng/dL — ABNORMAL HIGH (ref 300.00–890.00)

## 2022-02-25 NOTE — Assessment & Plan Note (Addendum)
Both knees, shins and ankles after a long day, encouraged to elevate and ice and hold statin for a couple of weeks. If improves may have to consider staying off statins. Check ANA today due to strong family history of lupus and encouraged to stay active and attempt modest weight loss ?

## 2022-02-25 NOTE — Assessment & Plan Note (Signed)
Encouraged DASH or MIND diet, decrease po intake and increase exercise as tolerated. Needs 7-8 hours of sleep nightly. Avoid trans fats, eat small, frequent meals every 4-5 hours with lean proteins, complex carbs and healthy fats. Minimize simple carbs, high fat foods and processed foods. He is encouraged to consider a course of Wegovy to manage weight loss, declines for now but will let us know if would like to proceed ?

## 2022-02-25 NOTE — Assessment & Plan Note (Signed)
hgba1c acceptable, minimize simple carbs. Increase exercise as tolerated.  

## 2022-02-25 NOTE — Assessment & Plan Note (Signed)
Tolerating statin, encouraged heart healthy diet, avoid trans fats, minimize simple carbs and saturated fats. Increase exercise as tolerated, has been off of Rosuvastatin a few days he will hold it for a couple of weeks to see if it helps his chronic pain in his legs  ?

## 2022-02-25 NOTE — Progress Notes (Signed)
? ?Subjective:  ? ?By signing my name below, I, Dustin Spears, attest that this documentation has been prepared under the direction and in the presence of Dustin CanaryStacey A Virgilia Quigg, MD. 02/25/2022  ? ? ? Patient ID: Dustin Spears, male    DOB: May 08, 1966, 56 y.o.   MRN: [id number] ? ?Chief Complaint  ?Patient presents with  ? Follow-up  ? ? ?HPI ?Patient is in today for an office visit and 6 month f/u. ? ?He complains of intermittent myalgias that radiates from his knees through his shins. He notices it when he is in a resting state after working. He has a history of plantar fascitis. He mentions he feels like there is a pressure on his feet and standing on them is very painful. He is very conscious about the shoes he wears.  He is not sure if the myalgias are due to the dosage of rosuvastatin he is on.  ? ?He has gained some weight over the past few months. He has been trying to manage a healthy diet.  ?Wt Readings from Last 3 Encounters:  ?02/25/22 261 lb (118.4 kg)  ?08/22/21 249 lb (112.9 kg)  ?06/04/21 253 lb (114.8 kg)  ?  ?He reports an increased sensitivity to heat over the past couple of years. He has always been sensitive but notes the [place] LinkSun feels like a "1000 watts" now. ? ?He complains of a occasional dry tickles in his throat that makes him cough. Normally occurs during the day. He is not sure if it is due to allergies or heartburn.  ? ?He is doing well with synthroid and testosterone medication. ? ?Past Medical History:  ?Diagnosis Date  ? ALLERGIC RHINITIS 03/12/2010  ? Allergy   ? seasonal  ? Bradycardia 01/11/2017  ? FATIGUE 03/12/2010  ? Headache(784.0) 03/12/2010  ? History of chicken pox   ? Hyperglycemia 07/10/2016  ? Hyperlipidemia   ? Knee pain, right   ? Morbid obesity (HCC) 02/26/2011  ? Morbid obesity (HCC) 02/26/2011  ? Neck pain, acute 03/12/2011  ? Overweight(278.02) 02/26/2011  ? Preventative health care 11/04/2012  ? RLS (restless legs syndrome) 01/08/2017  ? SLEEP APNEA, OBSTRUCTIVE 04/24/2010  ? SNORING 03/12/2010  ?  Sports physical 07/16/2012  ? Tendonitis 05/19/2012  ? Testosterone deficiency 10/19/2011  ? Thyroid disease   ? ? ?Past Surgical History:  ?Procedure Laterality Date  ? bicep tendon in left arm  2000  ? ? ?Family History  ?Problem Relation Age of Onset  ? Hyperlipidemia Mother   ? Cataracts Mother   ? Lupus Father   ? Alcohol abuse Father   ?     smoked  ? Arthritis Father   ? Arthritis Maternal Grandmother   ? Ulcers Maternal Grandmother   ? Alzheimer's disease Maternal Grandfather   ? Lupus Paternal Grandmother   ? Other Other   ?     lupus  ? Sleep apnea Neg Hx   ? ? ?Social History  ? ?Socioeconomic History  ? Marital status: Married  ?  Spouse name: Not on file  ? Number of children: 1  ? Years of education: Not on file  ? Highest education level: Not on file  ?Occupational History  ? 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  Employer: [employer]  ? Occupation: [employer]  ?Tobacco Use  ? Smoking status: Never  ? Smokeless tobacco: Never  ?Vaping Use  ? Vaping Use: Never used  ?Substance and Sexual Activity  ? Alcohol use: Yes  ?  Comment: occasional  ? Drug use: No  ? Sexual activity: Yes  ?Other Topics Concern  ? Not on file  ?Social History Narrative  ? Grew up in [place] to [employer] [place]  ? Lives at home with wife  ? Right handed  ? Caffeine: 3-4 cups/day  ? ?Social Determinants of Health  ? ?Financial Resource Strain: Not on file  ?Food Insecurity: Not on file  ?Transportation Needs: Not on file  ?Physical Activity: Not on file  ?Stress: Not on file  ?Social Connections: Not on file  ?Intimate Partner Violence: Not on file  ? ? ?Outpatient Medications Prior to Visit  ?Medication Sig Dispense Refill  ? Calcium Citrate-Vitamin D 200-250 MG-UNIT TABS Take 1 capsule by mouth daily. 120 tablet 0  ? cetirizine (ZYRTEC) 10 MG tablet Take 10 mg by mouth daily. Reported on 01/03/2016    ? diclofenac (VOLTAREN) 75 MG EC tablet Take 1 tablet (75 mg total) by mouth 2 (two) times daily as needed for moderate pain. 180 tablet 1  ?  fish oil-omega-3 fatty acids 1000 MG capsule Take 2 g by mouth daily.    ? levothyroxine (SYNTHROID) 50 MCG tablet TAKE 1 TABLET BY MOUTH EVERY DAY 90 tablet 2  ? Misc. Devices (NASADOCK) MISC by Does not apply route.    ? rosuvastatin (CRESTOR) 5 MG tablet TAKE 1 TABLET (5 MG TOTAL) BY MOUTH DAILY. 90 tablet 1  ? Testosterone 10 MG/ACT (2%) GEL APPLY 4 PUMPS TO SKIN DAILY 60 g 1  ? traMADol (ULTRAM) 50 MG tablet TAKE 1 TABLET BY MOUTH EVERY 12 HOURS AS NEEDED 30 tablet 0  ? Wheat Dextrin (BENEFIBER) POWD 2 tsp po daily in liquids 730 g 0  ? ?No facility-administered medications prior to visit.  ? ? ?No Known Allergies ? ?Review of Systems  ?Constitutional:  Negative for fever and malaise/fatigue.  ?HENT:  Negative for congestion.   ?Eyes:  Negative for redness.  ?Respiratory:  Negative for shortness of breath.   ?Cardiovascular:  Negative for chest pain, palpitations and leg swelling.  ?Gastrointestinal:  Negative for abdominal pain, blood in stool and nausea.  ?Genitourinary:  Negative for dysuria and frequency.  ?Musculoskeletal:  Positive for myalgias. Negative for falls.  ?     (+) shin pain  ?Skin:  Negative for rash.  ?Neurological:  Negative for dizziness, loss of consciousness and headaches.  ?Endo/Heme/Allergies:  Negative for polydipsia.  ?Psychiatric/Behavioral:  Negative for depression. The patient is not nervous/anxious.   ? ?   ?Objective:  ?  ?Physical Exam ?Constitutional:   ?   Appearance: Normal appearance. He is not ill-appearing.  ?HENT:  ?   Head: Normocephalic and atraumatic.  ?   Right Ear: Tympanic membrane, ear canal and external ear normal.  ?   Left Ear: Tympanic membrane, ear canal and external ear normal.  ?Eyes:  ?   Conjunctiva/sclera: Conjunctivae normal.  ?Cardiovascular:  ?   Rate and Rhythm: Normal rate and regular rhythm.  ?   Heart sounds: Normal heart sounds. No murmur heard. ?Pulmonary:  ?   Breath sounds: Normal breath sounds. No wheezing.  ?Abdominal:  ?   General: Bowel  sounds are normal. There is no distension.  ?   Palpations: Abdomen is soft.  ?   Tenderness: There is no abdominal tenderness.  ?   Hernia: No hernia is present.  ?Musculoskeletal:  ?   Cervical back: Neck supple.  ?Lymphadenopathy:  ?   Cervical: No cervical adenopathy.  ?  Skin: ?   General: Skin is warm and dry.  ?Neurological:  ?   Mental Status: He is alert and oriented to person, place, and time.  ?Psychiatric:     ?   Behavior: Behavior normal.  ? ? ?BP 132/86 (BP Location: Right Arm, Cuff Size: Large)   Pulse (!) 56   Resp 20   Ht 5\' 9"  (1.753 m)   Wt 261 lb (118.4 kg)   SpO2 98%   BMI 38.54 kg/m?  ?Wt Readings from Last 3 Encounters:  ?02/25/22 261 lb (118.4 kg)  ?08/22/21 249 lb (112.9 kg)  ?06/04/21 253 lb (114.8 kg)  ? ? ?Diabetic Foot Exam - Simple   ?No data filed ?  ? ?Lab Results  ?Component Value Date  ? WBC 8.2 06/04/2021  ? HGB 16.1 06/04/2021  ? HCT 47.8 06/04/2021  ? PLT 226.0 06/04/2021  ? GLUCOSE 93 06/04/2021  ? CHOL 182 06/04/2021  ? TRIG 183.0 (H) 06/04/2021  ? HDL 35.20 (L) 06/04/2021  ? LDLDIRECT 185.0 11/19/2020  ? LDLCALC 111 (H) 06/04/2021  ? ALT 39 06/04/2021  ? AST 26 06/04/2021  ? NA 137 06/04/2021  ? K 4.3 06/04/2021  ? CL 105 06/04/2021  ? CREATININE 0.93 06/04/2021  ? BUN 21 06/04/2021  ? CO2 23 06/04/2021  ? TSH 2.93 06/04/2021  ? PSA 0.68 06/04/2021  ? HGBA1C 5.8 06/04/2021  ? ? ?Lab Results  ?Component Value Date  ? TSH 2.93 06/04/2021  ? ?Lab Results  ?Component Value Date  ? WBC 8.2 06/04/2021  ? HGB 16.1 06/04/2021  ? HCT 47.8 06/04/2021  ? MCV 89.4 06/04/2021  ? PLT 226.0 06/04/2021  ? ?Lab Results  ?Component Value Date  ? NA 137 06/04/2021  ? K 4.3 06/04/2021  ? CO2 23 06/04/2021  ? GLUCOSE 93 06/04/2021  ? BUN 21 06/04/2021  ? CREATININE 0.93 06/04/2021  ? BILITOT 0.7 06/04/2021  ? ALKPHOS 30 (L) 06/04/2021  ? AST 26 06/04/2021  ? ALT 39 06/04/2021  ? PROT 7.3 06/04/2021  ? ALBUMIN 4.4 06/04/2021  ? CALCIUM 9.6 06/04/2021  ? GFR 91.92 06/04/2021  ? ?Lab Results   ?Component Value Date  ? CHOL 182 06/04/2021  ? ?Lab Results  ?Component Value Date  ? HDL 35.20 (L) 06/04/2021  ? ?Lab Results  ?Component Value Date  ? LDLCALC 111 (H) 06/04/2021  ? ?Lab Results  ?Component Va

## 2022-02-25 NOTE — Patient Instructions (Addendum)
Mounjaro or Ozempic for sugar ie diabetes ?WERXVQ or Saxenda for weight loss ? ?DASH Eating Plan ?DASH stands for Dietary Approaches to Stop Hypertension. The DASH eating plan is a healthy eating plan that has been shown to: ?Reduce high blood pressure (hypertension). ?Reduce your risk for type 2 diabetes, heart disease, and stroke. ?Help with weight loss. ?What are tips for following this plan? ?Reading food labels ?Check food labels for the amount of salt (sodium) per serving. Choose foods with less than 5 percent of the Daily Value of sodium. Generally, foods with less than 300 milligrams (mg) of sodium per serving fit into this eating plan. ?To find whole grains, look for the word "whole" as the first word in the ingredient list. ?Shopping ?Buy products labeled as "low-sodium" or "no salt added." ?Buy fresh foods. Avoid canned foods and pre-made or frozen meals. ?Cooking ?Avoid adding salt when cooking. Use salt-free seasonings or herbs instead of table salt or sea salt. Check with your health care provider or pharmacist before using salt substitutes. ?Do not fry foods. Cook foods using healthy methods such as baking, boiling, grilling, roasting, and broiling instead. ?Cook with heart-healthy oils, such as olive, canola, avocado, soybean, or sunflower oil. ?Meal planning ? ?Eat a balanced diet that includes: ?4 or more servings of fruits and 4 or more servings of vegetables each day. Try to fill one-half of your plate with fruits and vegetables. ?6-8 servings of whole grains each day. ?Less than 6 oz (170 g) of lean meat, poultry, or fish each day. A 3-oz (85-g) serving of meat is about the same size as a deck of cards. One egg equals 1 oz (28 g). ?2-3 servings of low-fat dairy each day. One serving is 1 cup (237 mL). ?1 serving of nuts, seeds, or beans 5 times each week. ?2-3 servings of heart-healthy fats. Healthy fats called omega-3 fatty acids are found in foods such as walnuts, flaxseeds, fortified milks,  and eggs. These fats are also found in cold-water fish, such as sardines, salmon, and mackerel. ?Limit how much you eat of: ?Canned or prepackaged foods. ?Food that is high in trans fat, such as some fried foods. ?Food that is high in saturated fat, such as fatty meat. ?Desserts and other sweets, sugary drinks, and other foods with added sugar. ?Full-fat dairy products. ?Do not salt foods before eating. ?Do not eat more than 4 egg yolks a week. ?Try to eat at least 2 vegetarian meals a week. ?Eat more home-cooked food and less restaurant, buffet, and fast food. ?Lifestyle ?When eating at a restaurant, ask that your food be prepared with less salt or no salt, if possible. ?If you drink alcohol: ?Limit how much you use to: ?0-1 drink a day for women who are not pregnant. ?0-2 drinks a day for men. ?Be aware of how much alcohol is in your drink. In the [place] one drink equals one 12 oz bottle of beer (355 mL), one 5 oz glass of wine (148 mL), or one 1? oz glass of hard liquor (44 mL). ?General information ?Avoid eating more than 2,300 mg of salt a day. If you have hypertension, you may need to reduce your sodium intake to 1,500 mg a day. ?Work with your health care provider to maintain a healthy body weight or to lose weight. Ask what an ideal weight is for you. ?Get at least 30 minutes of exercise that causes your heart to beat faster (aerobic exercise) most days of the week. Activities may  include walking, swimming, or biking. ?Work with your health care provider or dietitian to adjust your eating plan to your individual calorie needs. ?What foods should I eat? ?Fruits ?All fresh, dried, or frozen fruit. Canned fruit in natural juice (without added sugar). ?Vegetables ?Fresh or frozen vegetables (raw, steamed, roasted, or grilled). Low-sodium or reduced-sodium tomato and vegetable juice. Low-sodium or reduced-sodium tomato sauce and tomato paste. Low-sodium or reduced-sodium canned vegetables. ?Grains ?Whole-grain or  whole-wheat bread. Whole-grain or whole-wheat pasta. Brown rice. [place]ena Morrow. Bulgur. Whole-grain and low-sodium cereals. Pita bread. Low-fat, low-sodium crackers. Whole-wheat flour tortillas. ?Meats and other proteins ?Skinless chicken or [place]. Ground chicken or [place]. Pork with fat trimmed off. Fish and seafood. Egg whites. Dried beans, peas, or lentils. Unsalted nuts, nut butters, and seeds. Unsalted canned beans. Lean cuts of beef with fat trimmed off. Low-sodium, lean precooked or cured meat, such as sausages or meat loaves. ?Dairy ?Low-fat (1%) or fat-free (skim) milk. Reduced-fat, low-fat, or fat-free cheeses. Nonfat, low-sodium ricotta or cottage cheese. Low-fat or nonfat yogurt. Low-fat, low-sodium cheese. ?Fats and oils ?Soft margarine without trans fats. Vegetable oil. Reduced-fat, low-fat, or light mayonnaise and salad dressings (reduced-sodium). Canola, safflower, olive, avocado, soybean, and sunflower oils. Avocado. ?Seasonings and condiments ?Herbs. Spices. Seasoning mixes without salt. ?Other foods ?Unsalted popcorn and pretzels. Fat-free sweets. ?The items listed above may not be a complete list of foods and beverages you can eat. Contact a dietitian for more information. ?What foods should I avoid? ?Fruits ?Canned fruit in a light or heavy syrup. Fried fruit. Fruit in cream or butter sauce. ?Vegetables ?Creamed or fried vegetables. Vegetables in a cheese sauce. Regular canned vegetables (not low-sodium or reduced-sodium). Regular canned tomato sauce and paste (not low-sodium or reduced-sodium). Regular tomato and vegetable juice (not low-sodium or reduced-sodium). Angie Fava. Olives. ?Grains ?Baked goods made with fat, such as croissants, muffins, or some breads. Dry pasta or rice meal packs. ?Meats and other proteins ?Fatty cuts of meat. Ribs. Fried meat. Berniece Salines. Bologna, salami, and other precooked or cured meats, such as sausages or meat loaves. Fat from the back of a pig (fatback).  Bratwurst. Salted nuts and seeds. Canned beans with added salt. Canned or smoked fish. Whole eggs or egg yolks. Chicken or [place] with skin. ?Dairy ?Whole or 2% milk, cream, and half-and-half. Whole or full-fat cream cheese. Whole-fat or sweetened yogurt. Full-fat cheese. Nondairy creamers. Whipped toppings. Processed cheese and cheese spreads. ?Fats and oils ?Butter. Stick margarine. Lard. Shortening. Ghee. Bacon fat. Tropical oils, such as coconut, palm kernel, or palm oil. ?Seasonings and condiments ?Onion salt, garlic salt, seasoned salt, table salt, and sea salt. Worcestershire sauce. Tartar sauce. Barbecue sauce. Teriyaki sauce. Soy sauce, including reduced-sodium. Steak sauce. Canned and packaged gravies. Fish sauce. Oyster sauce. Cocktail sauce. Store-bought horseradish. Ketchup. Mustard. Meat flavorings and tenderizers. Bouillon cubes. Hot sauces. Pre-made or packaged marinades. Pre-made or packaged taco seasonings. Relishes. Regular salad dressings. ?Other foods ?Salted popcorn and pretzels. ?The items listed above may not be a complete list of foods and beverages you should avoid. Contact a dietitian for more information. ?Where to find more information ?National Heart, Lung, and Blood Institute: https://wilson-eaton.com/ ?American Heart Association: www.heart.org ?Academy of Nutrition and Dietetics: www.eatright.org ?Plain: www.kidney.org ?Summary ?The DASH eating plan is a healthy eating plan that has been shown to reduce high blood pressure (hypertension). It may also reduce your risk for type 2 diabetes, heart disease, and stroke. ?When on the DASH eating plan, aim to eat more fresh fruits  and vegetables, whole grains, lean proteins, low-fat dairy, and heart-healthy fats. ?With the DASH eating plan, you should limit salt (sodium) intake to 2,300 mg a day. If you have hypertension, you may need to reduce your sodium intake to 1,500 mg a day. ?Work with your health care provider or  dietitian to adjust your eating plan to your individual calorie needs. ?This information is not intended to replace advice given to you by your health care provider. Make sure you discuss any questions you have with yo

## 2022-02-25 NOTE — Assessment & Plan Note (Signed)
Supplement and monitor 

## 2022-02-26 ENCOUNTER — Encounter: Payer: Self-pay | Admitting: Family Medicine

## 2022-02-28 LAB — DRUG MONITORING, PANEL 8 WITH CONFIRMATION, URINE
6 Acetylmorphine: NEGATIVE ng/mL (ref <10)
Alcohol Metabolites: NEGATIVE ng/mL (ref <500)
Amphetamines: NEGATIVE ng/mL (ref <500)
Benzodiazepines: NEGATIVE ng/mL (ref <100)
Buprenorphine, Urine: NEGATIVE ng/mL (ref <5)
Cocaine Metabolite: NEGATIVE ng/mL (ref <150)
Creatinine: 138.1 mg/dL (ref >=20.0)
MDMA: NEGATIVE ng/mL (ref <500)
Marijuana Metabolite: NEGATIVE ng/mL (ref <20)
Opiates: NEGATIVE ng/mL (ref <100)
Oxidant: NEGATIVE ug/mL (ref <200)
Oxycodone: NEGATIVE ng/mL (ref <100)
pH: 5.7 (ref 4.5–9.0)

## 2022-02-28 LAB — ANA: Anti Nuclear Antibody (ANA): NEGATIVE

## 2022-02-28 LAB — DM TEMPLATE

## 2022-04-21 ENCOUNTER — Other Ambulatory Visit: Payer: Self-pay | Admitting: Family Medicine

## 2022-04-21 NOTE — Telephone Encounter (Signed)
Requesting: testosterone 10mg /act  Contract: n/a UDS: n/a Last Visit: 02/25/22 Next Visit:  09/02/22 Last Refill: 02/10/22 #60g and 1RF  Please Advise

## 2022-05-25 ENCOUNTER — Other Ambulatory Visit: Payer: Self-pay | Admitting: Family Medicine

## 2022-05-25 DIAGNOSIS — M25561 Pain in right knee: Secondary | ICD-10-CM

## 2022-06-30 ENCOUNTER — Other Ambulatory Visit: Payer: Self-pay | Admitting: Family Medicine

## 2022-06-30 NOTE — Telephone Encounter (Signed)
Requesting: testosterone 10mg /act (2%) gel  Contract: n/a UDS: n/a Last Visit: 02/25/22 Next Visit: 09/02/22 Last Refill: 04/23/22 #60g and 0RF  Please Advise

## 2022-07-21 ENCOUNTER — Other Ambulatory Visit: Payer: Self-pay | Admitting: Family Medicine

## 2022-08-01 ENCOUNTER — Other Ambulatory Visit: Payer: Self-pay | Admitting: Family Medicine

## 2022-08-04 NOTE — Telephone Encounter (Signed)
Requesting: testosterone 10mg /act gel Contract: n/a UDS:n/a Last Visit: 02/25/22 Next Visit:09/02/22 Last Refill: 07/01/22 #60g and 1RF  Please Advise

## 2022-08-28 ENCOUNTER — Ambulatory Visit: Payer: BC Managed Care – PPO | Admitting: Neurology

## 2022-08-28 ENCOUNTER — Encounter: Payer: Self-pay | Admitting: Neurology

## 2022-08-28 VITALS — BP 130/78 | HR 62 | Ht 69.0 in | Wt 261.0 lb

## 2022-08-28 DIAGNOSIS — G4733 Obstructive sleep apnea (adult) (pediatric): Secondary | ICD-10-CM | POA: Diagnosis not present

## 2022-08-28 NOTE — Progress Notes (Signed)
Subjective:    Patient ID: Dustin Spears is a 56 y.o. male.  HPI    Interim history:   Dustin Spears is a 56 year old right-handed gentleman with an underlying medical history of bradycardia, headaches, allergic rhinitis, hyperlipidemia, restless leg syndrome, sleep apnea, knee pain, and obesity, who He presents for follow-up consultation of his obstructive sleep apnea, established on CPAP therapy.  I saw him on 08/22/2021 for reevaluation of his sleep apnea.  He he reported some weight gain since his original diagnosis.  He was compliant with treatment, his AHI was borderline on a pressure of 9 cm, I suggest we increase his treatment pressure to 10 cm at the time.  He was advised to follow-up in 1 year routinely.  Today, 08/28/2022: I reviewed his CPAP compliance data from 07/28/2022 through 08/26/2022, which is a total of 30 days, during which time he used his machine every night with percent use days greater than 4 hours at 100%, indicating superb compliance with an average usage of 7 hours and 45 minutes, residual AHI at goal at 1.6/h, pressure of 10 cm without EPR, leak on the higher side with the 95th percentile at 35.1 L/min.  He reports doing well generally speaking.  He is compliant with treatment and continues to benefit from it.  He generally does not sleep without his CPAP.  He is typically up-to-date with his supplies, continues to use a Quattro fullface mask which he has preferred over other masks he tried.  Previously (copied from previous notes for reference):    08/22/21: (He) reports worsening snoring and daytime somnolence.  He has been on CPAP therapy with compliance.  He has had some weight gain.  I reviewed your office note from 06/04/2021.  His Epworth sleepiness score is 5 out of 24, fatigue severity score is 15 out of 63.  He is consistent with his CPAP use.  He has been told that he has snoring when he sleeps on his back.  This is per wife's feedback.  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  He is consistent with his sleep schedule.  During the workweek he goes to bed around 11 and rise time is around 5:30 AM.  He tries to sleep and on his days off.  He works as a Educational psychologist.  He lives with his wife, he has a grown daughter and a 72-year-old grandson.  He gets his supplies online typically.  He has switched to a Quattro fullface mask from [employer], size medium.  Keeps up-to-date with his filter changes and tubing changes.  He he humidifier in the machine and cleans his mask regularly, change his supplies on a regular basis as well.  He drinks coffee, sometimes throughout the day, up to 6 cups.  He drinks alcohol very occasionally, he is a non-smoker.  I reviewed his CPAP compliance data from from the past 90 days from 05/24/2021 through 08/21/2021, during which time he used his machine every night with percent use days greater than 4 hours at 100%, indicating superb compliance, average usage of 7 hours and 42 minutes which is excellent, residual AHI is borderline at 4.8/h, leak on the higher side with a 95th percentile at 24.9 L/min of the pressure of 9 cm.  In the past 30 days his compliance is similar, AHI 3.3/h, leak similar.  Of note, he does have a full beard.     07/14/2018: 56 year old right-handed  gentleman with an underlying medical history of allergic rhinitis, hyperlipidemia, restless leg syndrome, thyroid disease, low testosterone, neck pain and obesity, who presents for follow-up consultation of his obstructive sleep apnea after reevaluation with a sleep study. The patient is unaccompanied today. I first met him on 03/17/2018 at the request of his primary care physician, at which time he reported a prior diagnosis of obstructive sleep apnea. He needed new equipment and reevaluation after several years. He had some restless leg symptoms. He had a split-night sleep study in the interim on 04/05/2018. I went  over his test results with him in detail today. Baseline sleep efficiency was 88.5%, he had absence of REM sleep. Total AHI was in the severe range at 42.3 per hour, average oxygen saturation 92%, nadir was 73%. He was titrated on CPAP with a full facemask. On the final titration pressure of 8 cm his AHI was 0.4 per hour, with supine REM sleep achieved an O2 nadir of 88%. I suggested a home treatment pressure of 9 cm. 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    I reviewed his CPAP compliance data from 06/14/2018 through 07/13/2018 which is a total of 30 days, during which time he used his CPAP every night with percent used days greater than 4 hours at 100%, indicating superb compliance with an average usage of 7 hours and 24 minutes, residual AHI at goal at 1.5 per hour, leak on the high side with the 95th percentile at 39 L/m on a pressure of 9 cm. He reports that his new machine is working well. It is quieter than his old machine. He has noticed a little bit of mask leak issues. Attributes this to his full beard. He had been out of work on short-term disability after an injury to his right hand. He is been back to work for the last month. When he was at home out of work he was sleeping more. Overall, things are going well with his CPAP, he has no new complaints, does not mind the pressure of 9 cm. Has been using a fullface mask and is used to it. He has had some weight gain over the past 3 years. He is motivated to work on weight loss.     03/17/2018: (He) was previously diagnosed with obstructive sleep apnea and placed on CPAP therapy. He reports being compliant with his CPAP machine. He had prior sleep study testing in 2011. He had a split-night sleep study at [facility] on 04/09/2010 and I reviewed the results, study was interpreted by Dr. Gwenette Greet at the time. His overall AHI was 17 per hour, oxygen nadir was  85%. He was fitted with a nasal mask and CPAP was titrated to a final pressure of 8 cm. His BMI was listed as 34 at the time, weight at 230 pounds. He did not have any significant PLMS during the study. His machine is older, nearly 57 years old. A CPAP download report was reviewed today: I reviewed his compliance data from 02/15/2018 through 03/16/2018 which is a total of 30 days, during which time he was fully compliant with usage with percent used days greater than 4 hours at 100%, indicating superb compliance, average usage of 7 hours and 21 minutes, residual AHI and leak information unfortunately is not available through the card on the machine. Pressure is at 8 cm. I reviewed your office note from  01/21/2018. He has a diagnosis of restless leg syndrome and while he does not endorse much in the way of RLS symptoms at this time, he  has leg twitching and occasional arm twitching during sleep as reported by his wife. Symptoms are intermittent, not every night, started about a year ago or so as noticed by his wife primarily. His Epworth sleepiness score is 9 out of 24 today, fatigue score is 22 out of 63. He is married and lives with his wife, they have 1 child. He is a nonsmoker and drinks alcohol maybe twice a week, caffeine in the form of coffee, 4 cups per day on average.  He does not have a history of thyroid dysfunction or anemia or iron deficiency. He is not on an antidepressant. He recently stopped his Crestor as he had muscle aching when the dose was increased to 20 mg, he is going to restart at 10 mg strength. His leg movements are described as wrapping his feet against each other while he is asleep. He is not aware of these movements or twitching. They are somewhat bothersome to his wife when she is trying to fall asleep but once she is asleep his movements don't bother her, he is not bothered by his symptoms, he does not endorse any actual symptoms of restless leg syndrome, is not aware of any family  history of RLS. He does snore some through his mask, he has had some weight gain over the past few years. Compared to 2011 he has gained about 18 pounds. His bedtime is between 10:30 and 11 PM, rise time around 5:40 AM. He has no nighttime nocturia or morning headaches.  His Past Medical History Is Significant For: Past Medical History:  Diagnosis Date   ALLERGIC RHINITIS 03/12/2010   Allergy    seasonal   Bradycardia 01/11/2017   FATIGUE 03/12/2010   Headache(784.0) 03/12/2010   History of chicken pox    Hyperglycemia 07/10/2016   Hyperlipidemia    Knee pain, right    Morbid obesity ([place]) 02/26/2011   Morbid obesity ([place]) 02/26/2011   Neck pain, acute 03/12/2011   Overweight(278.02) 02/26/2011   Preventative health care 11/04/2012   RLS (restless legs syndrome) 01/08/2017   SLEEP APNEA, OBSTRUCTIVE 04/24/2010   SNORING 03/12/2010   Sports physical 07/16/2012   Tendonitis 05/19/2012   Testosterone deficiency 10/19/2011   Thyroid disease     His Past Surgical History Is Significant For: Past Surgical History:  Procedure Laterality Date   bicep tendon in left arm  2000    His Family History Is Significant For: Family History  Problem Relation Age of Onset   Hyperlipidemia Mother    Cataracts Mother    Lupus Father    Alcohol abuse Father        smoked   Arthritis Father    Arthritis Maternal Grandmother    Ulcers Maternal Grandmother    Alzheimer's disease Maternal Grandfather    Lupus Paternal Grandmother    Other Other        lupus   Sleep apnea Neg Hx     His Social History Is Significant For: Social History   Socioeconomic History   Marital status: Married    Spouse name: Not on file   Number of children: 1   Years of education: Not on file   Highest education level: Not on file  Occupational History   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- [employer]    Employer: [employer]   Occupation: [employer]  Tobacco  Use   Smoking status: Never   Smokeless tobacco: Never  Vaping Use   Vaping  Use: Never used  Substance and Sexual Activity   Alcohol use: Yes    Comment: occasional   Drug use: No   Sexual activity: Yes  Other Topics Concern   Not on file  Social History Narrative   [place]   Lives at home with wife   Right handed   Caffeine: 3-4 cups/day   Social Determinants of Health   Financial Resource Strain: Not on file  Food Insecurity: Not on file  Transportation Needs: Not on file  Physical Activity: Not on file  Stress: Not on file  Social Connections: Not on file    His Allergies Are:  No Known Allergies:   His Current Medications Are:  Outpatient Encounter Medications as of 08/28/2022  Medication Sig   Calcium Citrate-Vitamin D 200-250 MG-UNIT TABS Take 1 capsule by mouth daily.   cetirizine (ZYRTEC) 10 MG tablet Take 10 mg by mouth daily. Reported on 01/03/2016   Coenzyme Q10 (CO Q 10 PO) Take by mouth.   diclofenac (VOLTAREN) 75 MG EC tablet TAKE 1 TABLET BY MOUTH 2 TIMES DAILY AS NEEDED FOR MODERATE PAIN.   fish oil-omega-3 fatty acids 1000 MG capsule Take 2 g by mouth daily.   levothyroxine (SYNTHROID) 50 MCG tablet TAKE 1 TABLET BY MOUTH EVERY DAY   MAGNESIUM PO Take by mouth.   Misc. Devices (NASADOCK) MISC by Does not apply route.   Multiple Vitamins-Minerals (ZINC PO) Take by mouth.   rosuvastatin (CRESTOR) 5 MG tablet TAKE 1 TABLET (5 MG TOTAL) BY MOUTH DAILY.   Testosterone 10 MG/ACT (2%) GEL APPLY 4 PUMPS TO SKIN DAILY   traMADol (ULTRAM) 50 MG tablet TAKE 1 TABLET BY MOUTH EVERY 12 HOURS AS NEEDED   UNABLE TO FIND Med Name: Tumeric   Wheat Dextrin (BENEFIBER) POWD 2 tsp po daily in liquids   No facility-administered encounter medications on file as of 08/28/2022.  :  Review of Systems:  Out of a complete 14 point review of systems, all are reviewed and negative with the exception of these symptoms as listed below:  Review of Systems  Neurological:        Pt here for CPAP f/u Pt states no questions or  concerns for today's visit     Objective:  Neurological Exam  Physical Exam Physical Examination:   Vitals:   08/28/22 1401  BP: 130/78  Pulse: 62    General Examination: The patient is a very pleasant 56 y.o. male in no acute distress. He appears well-developed and well-nourished and well groomed.   HEENT: Normocephalic, atraumatic, pupils are equal, round and reactive to light, corrective eyeglasses in place. Tracking is well-preserved, hearing grossly intact.  Face is symmetric with normal facial animation.  Speech is clear without dysarthria, hypophonia or voice tremor.  Neck is supple, no carotid bruits.  Airway examination reveals no significant mouth dryness.  Moderate airway crowding noted, tonsillar size of about 1-2+, Mallampati class I. Tongue protrudes centrally and palate elevates symmetrically, stable findings.   Chest: Clear to auscultation without wheezing, rhonchi or crackles noted.   Heart: S1+S2+0, regular and normal without murmurs, rubs or gallops noted.    Abdomen: Soft, non-tender and non-distended.    Extremities: There is no obvious edema in the distal lower extremities bilaterally.   Skin: Warm and dry without trophic changes noted.   Musculoskeletal: exam reveals  no obvious joint deformities.   Neurologically:  Mental status: The patient is awake, alert and oriented in all 4 spheres. His immediate and remote memory, attention, language skills and fund of knowledge are appropriate. There is no evidence of aphasia, agnosia, apraxia or anomia. Speech is clear with normal prosody and enunciation. Thought process is linear. Mood is normal and affect is normal.  Cranial nerves II - XII are as described above under HEENT exam.  Motor exam: Normal bulk, strength and tone is noted. There is no obvious resting or action tremor.  Fine motor skills and coordination: intact grossly.   Cerebellar testing: No dysmetria or intention tremor. There is no truncal or gait  ataxia.  Sensory exam: intact to light touch.  Gait, station and balance: He stands easily. No veering to one side is noted. No leaning to one side is noted. Posture is age-appropriate and stance is narrow based. Gait shows normal stride length and normal pace. No problems turning are noted.    Assessment and Plan:  In summary, Catlin Aycock is a very pleasant 56 year old male with an underlying medical history of bradycardia, headaches, allergic rhinitis, hyperlipidemia, restless leg syndrome, sleep apnea, knee pain, and obesity, who presents for follow-up consultation of his obstructive sleep apnea.  He had a split-night sleep study with our office in 2019.  He has generally been very compliant with his CPAP.  We increased his pressure from 9 cm to 10 cm in November 2022.  His apnea scores have improved.  He does have a higher leak but this is probably due to using a fullface mask and having a beard.  He is not bothered by the leg in general.  He is commended for his treatment adherence.  He does well and continues to benefit from treatment.  He is motivated to continue with the current settings and the current mask and is generally up-to-date with supplies.  He is advised to follow-up routinely in this clinic to see one of our nurse practitioners in 1 year.  He may be eligible for a new machine by next year, we can consider doing a home sleep test next year around this time to be able to issue a new machine if he would prefer but for now he is not having any trouble with the machine.  I answered all his questions today and he was in agreement with our plan. I spent 20 minutes in total face-to-face time and in reviewing records during pre-charting, more than 50% of which was spent in counseling and coordination of care, reviewing test results, reviewing medications and treatment regimen and/or in discussing or reviewing the diagnosis of OSA, the prognosis and treatment options. Pertinent laboratory and  imaging test results that were available during this visit with the patient were reviewed by me and considered in my medical decision making (see chart for details).

## 2022-08-28 NOTE — Patient Instructions (Signed)
It was nice to see you again today.  You are fully compliant with your CPAP, your apnea control is very good, keep up the good work!  We can see you in 1 year, you can see one of our nurse practitioners as you are stable.   Please continue using your CPAP regularly. While your insurance requires that you use CPAP at least 4 hours each night on 70% of the nights, I recommend, that you not skip any nights and use it throughout the night if you can. Getting used to CPAP and staying with the treatment long term does take time and patience and discipline. Untreated obstructive sleep apnea when it is moderate to severe can have an adverse impact on cardiovascular health and raise her risk for heart disease, arrhythmias, hypertension, congestive heart failure, stroke and diabetes. Untreated obstructive sleep apnea causes sleep disruption, nonrestorative sleep, and sleep deprivation. This can have an impact on your day to day functioning and cause daytime sleepiness and impairment of cognitive function, memory loss, mood disturbance, and problems focussing. Using CPAP regularly can improve these symptoms.

## 2022-09-01 NOTE — Assessment & Plan Note (Signed)
Encourage heart healthy diet such as MIND or DASH diet, increase exercise, avoid trans fats, simple carbohydrates and processed foods, consider a krill or fish or flaxseed oil cap daily.   Cologuard 2022 repeat in 2025 Encouraged flu and covid boosters.

## 2022-09-01 NOTE — Assessment & Plan Note (Signed)
Encourage heart healthy diet such as MIND or DASH diet, increase exercise, avoid trans fats, simple carbohydrates and processed foods, consider a krill or fish or flaxseed oil cap daily.  °

## 2022-09-01 NOTE — Assessment & Plan Note (Signed)
hgba1c acceptable, minimize simple carbs. Increase exercise as tolerated.  

## 2022-09-01 NOTE — Assessment & Plan Note (Signed)
Supplement and monitor 

## 2022-09-01 NOTE — Assessment & Plan Note (Signed)
On Levothyroxine, continue to monitor 

## 2022-09-02 ENCOUNTER — Ambulatory Visit (INDEPENDENT_AMBULATORY_CARE_PROVIDER_SITE_OTHER): Payer: BC Managed Care – PPO | Admitting: Family Medicine

## 2022-09-02 VITALS — BP 122/78 | HR 74 | Temp 98.0°F | Resp 16 | Ht 69.0 in | Wt 260.6 lb

## 2022-09-02 DIAGNOSIS — Z79899 Other long term (current) drug therapy: Secondary | ICD-10-CM

## 2022-09-02 DIAGNOSIS — E038 Other specified hypothyroidism: Secondary | ICD-10-CM | POA: Diagnosis not present

## 2022-09-02 DIAGNOSIS — E782 Mixed hyperlipidemia: Secondary | ICD-10-CM | POA: Diagnosis not present

## 2022-09-02 DIAGNOSIS — M25561 Pain in right knee: Secondary | ICD-10-CM

## 2022-09-02 DIAGNOSIS — Z0000 Encounter for general adult medical examination without abnormal findings: Secondary | ICD-10-CM | POA: Diagnosis not present

## 2022-09-02 DIAGNOSIS — L989 Disorder of the skin and subcutaneous tissue, unspecified: Secondary | ICD-10-CM | POA: Diagnosis not present

## 2022-09-02 DIAGNOSIS — R350 Frequency of micturition: Secondary | ICD-10-CM | POA: Diagnosis not present

## 2022-09-02 DIAGNOSIS — R739 Hyperglycemia, unspecified: Secondary | ICD-10-CM

## 2022-09-02 DIAGNOSIS — E349 Endocrine disorder, unspecified: Secondary | ICD-10-CM

## 2022-09-02 DIAGNOSIS — G8929 Other chronic pain: Secondary | ICD-10-CM

## 2022-09-02 MED ORDER — TESTOSTERONE 10 MG/ACT (2%) TD GEL: 4.0000 | Freq: Every day | TRANSDERMAL | 5 refills | Status: DC

## 2022-09-02 MED ORDER — TRAMADOL HCL 50 MG PO TABS: 50.0000 mg | ORAL_TABLET | Freq: Two times a day (BID) | ORAL | 0 refills | Status: DC | PRN

## 2022-09-02 NOTE — Progress Notes (Signed)
Subjective:   By signing my name below, I, Vickey Sages, attest that this documentation has been prepared under the direction and in the presence of Bradd Canary, MD., 09/02/2022.   Patient ID: Dustin Spears, male DOB: May 16, 1966, 56 y.o.  MRN: [id number]  No chief complaint on file.  HPI Patient is in today for a comprehensive physical exam and follow up on chronic medical concerns.   Patient denies having fever, chills, ear pain, headaches, muscle pain, joint pain, congestion, sinus pain, sore throat, chest pain, palpitations, wheezing, nausea, vomitting, abdominal pain, diarrhea, constipation, blood in stool, dysuria, urgency, frequency and hematuria.  Back Pain: Patient uses Diclofenac 75 mg, Tylenol and occasionally Tramadol 50 mg to manage his back pain. He states that he has intermittent pain above his right hip.  Cholesterol: He states that he has started taking Rosuvastatin 5 mg recently, but has been experiencing aches in his knees.  Lab Results  Component Value Date   CHOL 143 02/25/2022   HDL 35.80 (L) 02/25/2022   LDLCALC 69 02/25/2022   LDLDIRECT 185.0 11/19/2020   TRIG 190.0 (H) 02/25/2022   CHOLHDL 4 02/25/2022   Dermatology: Patient is inquiring about a 2 mm brown spot on the left side of his face that he noticed 6 months ago. He states that the spot is occasionally itchy and suspects that it is growing. He has a dermatology appointment in 10/2022 to examine this.  FHx: No changes to FHx.  Refills: He is requesting a refill on Testosterone 10 mg/act 2% gel and Tramadol 50 mg.  Supplements: He takes calcium, CoQ10, fish oil, magnesium, multivitamins, and zinc.  Weight Loss: Patient is interested in researching and possibly taking weight loss medications. Body mass index is 38.48 kg/m.  Wt Readings from Last 3 Encounters:  09/02/22 260 lb 9.6 oz (118.2 kg)  08/28/22 261 lb (118.4 kg)  02/25/22 261 lb (118.4 kg)   Immunization History  Administered Date(s)  Administered   PFIZER Comirnaty(Gray Top)Covid-19 Tri-Sucrose Vaccine 03/15/2021   PFIZER(Purple Top)SARS-COV-2 Vaccination 07/05/2020, 07/26/2020   Td 03/10/2000, 06/02/2006   Tdap 11/04/2012   Past Medical History:  Diagnosis Date   ALLERGIC RHINITIS 03/12/2010   Allergy    seasonal   Bradycardia 01/11/2017   FATIGUE 03/12/2010   Headache(784.0) 03/12/2010   History of chicken pox    Hyperglycemia 07/10/2016   Hyperlipidemia    Knee pain, right    Morbid obesity (HCC) 02/26/2011   Morbid obesity (HCC) 02/26/2011   Neck pain, acute 03/12/2011   Overweight(278.02) 02/26/2011   Preventative health care 11/04/2012   RLS (restless legs syndrome) 01/08/2017   SLEEP APNEA, OBSTRUCTIVE 04/24/2010   SNORING 03/12/2010   Sports physical 07/16/2012   Tendonitis 05/19/2012   Testosterone deficiency 10/19/2011   Thyroid disease    Past Surgical History:  Procedure Laterality Date   bicep tendon in left arm  2000   Family History  Problem Relation Age of Onset   Hyperlipidemia Mother    Cataracts Mother    Lupus Father    Alcohol abuse Father        smoked   Arthritis Father    Arthritis Maternal Grandmother    Ulcers Maternal Grandmother    Alzheimer's disease Maternal Grandfather    Lupus Paternal Grandmother    Other Other        lupus   Sleep apnea Neg Hx    Social History   Socioeconomic History   Marital status: Married    Spouse  name: Not on file   Number of children: 1   Years of education: Not on file   Highest education level: Not on file  Occupational History   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- [employer]    Employer: [employer]   Occupation: [employer]  Tobacco Use   Smoking status: Never   Smokeless tobacco: Never  Vaping Use   Vaping Use: Never used  Substance and Sexual Activity   Alcohol use: Yes    Comment: occasional   Drug use: No   Sexual activity: Yes  Other Topics Concern   Not on file  Social History Narrative   [place]   Lives at home  with wife   Right handed   Caffeine: 3-4 cups/day   Social Determinants of Health   Financial Resource Strain: Not on file  Food Insecurity: Not on file  Transportation Needs: Not on file  Physical Activity: Not on file  Stress: Not on file  Social Connections: Not on file  Intimate Partner Violence: Not on file   Outpatient Medications Prior to Visit  Medication Sig Dispense Refill   Calcium Citrate-Vitamin D 200-250 MG-UNIT TABS Take 1 capsule by mouth daily. 120 tablet 0   cetirizine (ZYRTEC) 10 MG tablet Take 10 mg by mouth daily. Reported on 01/03/2016     Coenzyme Q10 (CO Q 10 PO) Take by mouth.     diclofenac (VOLTAREN) 75 MG EC tablet TAKE 1 TABLET BY MOUTH 2 TIMES DAILY AS NEEDED FOR MODERATE PAIN. 180 tablet 1   fish oil-omega-3 fatty acids 1000 MG capsule Take 2 g by mouth daily.     levothyroxine (SYNTHROID) 50 MCG tablet TAKE 1 TABLET BY MOUTH EVERY DAY 85 tablet 1   MAGNESIUM PO Take by mouth.     Misc. Devices (NASADOCK) MISC by Does not apply route.     Multiple Vitamins-Minerals (ZINC PO) Take by mouth.     rosuvastatin (CRESTOR) 5 MG tablet TAKE 1 TABLET (5 MG TOTAL) BY MOUTH DAILY. 90 tablet 1   Testosterone 10 MG/ACT (2%) GEL APPLY 4 PUMPS TO SKIN DAILY 60 g 1   traMADol (ULTRAM) 50 MG tablet TAKE 1 TABLET BY MOUTH EVERY 12 HOURS AS NEEDED 30 tablet 0   UNABLE TO FIND Med Name: Tumeric     Wheat Dextrin (BENEFIBER) POWD 2 tsp po daily in liquids 730 g 0   No facility-administered medications prior to visit.   No Known Allergies  Review of Systems  Constitutional:  Negative for chills and fever.  HENT:  Negative for congestion, ear pain, sinus pain and sore throat.   Respiratory:  Negative for cough, shortness of breath and wheezing.   Cardiovascular:  Negative for chest pain and palpitations.  Gastrointestinal:  Negative for abdominal pain, blood in stool, constipation, diarrhea, nausea and vomiting.  Genitourinary:  Negative for dysuria, frequency,  hematuria and urgency.  Musculoskeletal:  Negative for joint pain and myalgias.  Skin:           Neurological:  Negative for headaches.      Objective:    Physical Exam Constitutional:      General: He is not in acute distress.    Appearance: Normal appearance. He is not ill-appearing.  HENT:     Head: Normocephalic and atraumatic.     Right Ear: Tympanic membrane, ear canal and external ear normal.     Left Ear: Tympanic membrane, ear canal and external ear normal.  Mouth/Throat:     Mouth: Mucous membranes are Spears.     Pharynx: Oropharynx is clear.  Eyes:     Extraocular Movements: Extraocular movements intact.     Right eye: No nystagmus.     Left eye: No nystagmus.     Pupils: Pupils are equal, round, and reactive to light.  Neck:     Vascular: No carotid bruit.  Cardiovascular:     Rate and Rhythm: Normal rate and regular rhythm.     Pulses: Normal pulses.     Heart sounds: Normal heart sounds. No murmur heard.    No gallop.  Pulmonary:     Effort: Pulmonary effort is normal. No respiratory distress.     Breath sounds: Normal breath sounds. No wheezing or rales.  Abdominal:     General: Bowel sounds are normal.     Tenderness: There is no abdominal tenderness.  Musculoskeletal:     Comments: Muscle strength 5/5 on upper and lower extremities.   Lymphadenopathy:     Cervical: No cervical adenopathy.  Skin:    General: Skin is warm and dry.     Comments: 2 mm brown spot (possible seborrhea) on left side of the face.   Neurological:     Mental Status: He is alert and oriented to person, place, and time.     Sensory: Sensation is intact.     Motor: Motor function is intact.     Coordination: Coordination is intact.     Deep Tendon Reflexes:     Reflex Scores:      Patellar reflexes are 2+ on the right side and 2+ on the left side. Psychiatric:        Mood and Affect: Mood normal.        Behavior: Behavior normal.        Judgment: Judgment normal.     There were no vitals taken for this visit. Wt Readings from Last 3 Encounters:  08/28/22 261 lb (118.4 kg)  02/25/22 261 lb (118.4 kg)  08/22/21 249 lb (112.9 kg)   Diabetic Foot Exam - Simple   No data filed    Lab Results  Component Value Date   WBC 5.9 02/25/2022   HGB 15.5 02/25/2022   HCT 46.3 02/25/2022   PLT 220.0 02/25/2022   GLUCOSE 91 02/25/2022   CHOL 143 02/25/2022   TRIG 190.0 (H) 02/25/2022   HDL 35.80 (L) 02/25/2022   LDLDIRECT 185.0 11/19/2020   LDLCALC 69 02/25/2022   ALT 46 02/25/2022   AST 32 02/25/2022   NA 135 02/25/2022   K 4.2 02/25/2022   CL 103 02/25/2022   CREATININE 1.00 02/25/2022   BUN 19 02/25/2022   CO2 23 02/25/2022   TSH 3.01 02/25/2022   PSA 0.68 06/04/2021   HGBA1C 5.9 02/25/2022   Lab Results  Component Value Date   TSH 3.01 02/25/2022   Lab Results  Component Value Date   WBC 5.9 02/25/2022   HGB 15.5 02/25/2022   HCT 46.3 02/25/2022   MCV 89.7 02/25/2022   PLT 220.0 02/25/2022   Lab Results  Component Value Date   NA 135 02/25/2022   K 4.2 02/25/2022   CO2 23 02/25/2022   GLUCOSE 91 02/25/2022   BUN 19 02/25/2022   CREATININE 1.00 02/25/2022   BILITOT 0.7 02/25/2022   ALKPHOS 31 (L) 02/25/2022   AST 32 02/25/2022   ALT 46 02/25/2022   PROT 7.2 02/25/2022   ALBUMIN 4.4 02/25/2022   CALCIUM 8.9  02/25/2022   GFR 83.82 02/25/2022   Lab Results  Component Value Date   CHOL 143 02/25/2022   Lab Results  Component Value Date   HDL 35.80 (L) 02/25/2022   Lab Results  Component Value Date   LDLCALC 69 02/25/2022   Lab Results  Component Value Date   TRIG 190.0 (H) 02/25/2022   Lab Results  Component Value Date   CHOLHDL 4 02/25/2022   Lab Results  Component Value Date   HGBA1C 5.9 02/25/2022   PSA: Last completed on 06/04/2021. 0.68 ng/mL. Repeat in 2 years.    Assessment & Plan:   Problem List Items Addressed This Visit       Endocrine   Hypothyroidism     Other   Hyperlipidemia    Testosterone deficiency   Preventative health care   Hyperglycemia - Primary   No orders of the defined types were placed in this encounter.  I, Vickey Sages, personally preformed the services described in this documentation.  All medical record entries made by the scribe were at my direction and in my presence.  I have reviewed the chart and discharge instructions (if applicable) and agree that the record reflects my personal performance and is accurate and complete. 09/02/2022  I,Mohammed Iqbal,acting as a scribe for [name], MD.,have documented all relevant documentation on the behalf of [name], MD,as directed by  [name], MD while in the presence of [name], MD.  Vickey Sages

## 2022-09-02 NOTE — Assessment & Plan Note (Addendum)
Noticed about 6 + months ago, growing slowly occasionally symptomatic with itching and tingling. Has an appt with dermatology in January 2024. Appearance suggestive of SK

## 2022-09-02 NOTE — Patient Instructions (Addendum)
Tetanus due in 2024 or if there injured Shingrix is the new shingles shot, 2 shots over 2-6 months, confirm coverage with insurance and document, then can return here for shots with nurse appt or at pharmacy   Preventive Care 1-57 Years Old, Male Preventive care refers to lifestyle choices and visits with your health care provider that can promote health and wellness. Preventive care visits are also called wellness exams. What can I expect for my preventive care visit? Counseling During your preventive care visit, your health care provider may ask about your: Medical history, including: Past medical problems. Family medical history. Current health, including: Emotional well-being. Home life and relationship well-being. Sexual activity. Lifestyle, including: Alcohol, nicotine or tobacco, and drug use. Access to firearms. Diet, exercise, and sleep habits. Safety issues such as seatbelt and bike helmet use. Sunscreen use. 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 Physical exam Your health care provider will check your: Height and weight. These may be used to calculate your BMI (body mass index). BMI is a measurement that tells if you are at a healthy weight. Waist circumference. This measures the distance around your waistline. This measurement also tells if you are at a healthy weight and may help predict your risk of certain diseases, such as type 2 diabetes and high blood pressure. Heart rate and blood pressure. Body temperature. Skin for abnormal spots. What immunizations do I need?  Vaccines are usually given at various ages, according to a schedule. Your health care provider will recommend vaccines for you based on your age, medical history, and lifestyle or other factors, such as travel or where you work. What tests do I need? Screening Your health care provider may recommend screening tests for certain conditions. This may include: Lipid and cholesterol levels. Diabetes screening. This is  done by checking your blood sugar (glucose) after you have not eaten for a while (fasting). Hepatitis B test. Hepatitis C test. HIV (human immunodeficiency virus) test. STI (sexually transmitted infection) testing, if you are at risk. Lung cancer screening. Prostate cancer screening. Colorectal cancer screening. Talk with your health care provider about your test results, treatment options, and if necessary, the need for more tests. Follow these instructions at home: Eating and drinking  Eat a diet that includes fresh fruits and vegetables, whole grains, lean protein, and low-fat dairy products. Take vitamin and mineral supplements as recommended by your health care provider. Do not drink alcohol if your health care provider tells you not to drink. If you drink alcohol: Limit how much you have to 0-2 drinks a day. Know how much alcohol is in your drink. In the [place] one drink equals one 12 oz bottle of beer (355 mL), one 5 oz glass of wine (148 mL), or one 1 oz glass of hard liquor (44 mL). Lifestyle Brush your teeth every morning and night with fluoride toothpaste. Floss one time each day. Exercise for at least 30 minutes 5 or more days each week. Do not use any products that contain nicotine or tobacco. These products include cigarettes, chewing tobacco, and vaping devices, such as e-cigarettes. If you need help quitting, ask your health care provider. Do not use drugs. If you are sexually active, practice safe sex. Use a condom or other form of protection to prevent STIs. Take aspirin only as told by your health care provider. Make sure that you understand how much to take and what form to take. Work with your health care provider to find out whether it is safe and beneficial  for you to take aspirin daily. Find healthy ways to manage stress, such as: Meditation, yoga, or listening to music. Journaling. Talking to a trusted person. Spending time with friends and family. Minimize  exposure to UV radiation to reduce your risk of skin cancer. Safety Always wear your seat belt while driving or riding in a vehicle. Do not drive: If you have been drinking alcohol. Do not ride with someone who has been drinking. When you are tired or distracted. While texting. If you have been using any mind-altering substances or drugs. Wear a helmet and other protective equipment during sports activities. If you have firearms in your house, make sure you follow all gun safety procedures. What's next? Go to your health care provider once a year for an annual wellness visit. Ask your health care provider how often you should have your eyes and teeth checked. Stay up to date on all vaccines. This information is not intended to replace advice given to you by your health care provider. Make sure you discuss any questions you have with your health care provider. Document Revised: 04/03/2021 Document Reviewed: 04/03/2021 Elsevier Patient Education  2023 [employer].

## 2022-09-03 ENCOUNTER — Encounter: Payer: Self-pay | Admitting: Family Medicine

## 2022-09-03 ENCOUNTER — Other Ambulatory Visit: Payer: Self-pay

## 2022-09-03 DIAGNOSIS — R350 Frequency of micturition: Secondary | ICD-10-CM | POA: Insufficient documentation

## 2022-09-03 DIAGNOSIS — E860 Dehydration: Secondary | ICD-10-CM

## 2022-09-03 DIAGNOSIS — E782 Mixed hyperlipidemia: Secondary | ICD-10-CM

## 2022-09-03 LAB — COMPREHENSIVE METABOLIC PANEL
ALT: 53 U/L (ref 0–53)
AST: 33 U/L (ref 0–37)
Albumin: 4.5 g/dL (ref 3.5–5.2)
Alkaline Phosphatase: 34 U/L — ABNORMAL LOW (ref 39–117)
BUN: 28 mg/dL — ABNORMAL HIGH (ref 6–23)
CO2: 25 mEq/L (ref 19–32)
Calcium: 9.4 mg/dL (ref 8.4–10.5)
Chloride: 102 mEq/L (ref 96–112)
Creatinine, Ser: 1.3 mg/dL (ref 0.40–1.50)
GFR: 60.96 mL/min (ref >60.00)
Glucose, Bld: 77 mg/dL (ref 70–99)
Potassium: 4.1 mEq/L (ref 3.5–5.1)
Sodium: 135 mEq/L (ref 135–145)
Total Bilirubin: 0.4 mg/dL (ref 0.2–1.2)
Total Protein: 7.4 g/dL (ref 6.0–8.3)

## 2022-09-03 LAB — LIPID PANEL
Cholesterol: 225 mg/dL — ABNORMAL HIGH (ref 0–200)
HDL: 37.7 mg/dL — ABNORMAL LOW (ref >39.00)
NonHDL: 187.58
Total CHOL/HDL Ratio: 6
Triglycerides: 353 mg/dL — ABNORMAL HIGH (ref 0.0–149.0)
VLDL: 70.6 mg/dL — ABNORMAL HIGH (ref 0.0–40.0)

## 2022-09-03 LAB — TESTOSTERONE: Testosterone: 459.04 ng/dL (ref 300.00–890.00)

## 2022-09-03 LAB — URINE CULTURE
MICRO NUMBER:: 14186784
Result:: NO GROWTH
SPECIMEN QUALITY:: ADEQUATE

## 2022-09-03 LAB — URINALYSIS
Bilirubin Urine: NEGATIVE
Leukocytes,Ua: NEGATIVE
Nitrite: NEGATIVE
Specific Gravity, Urine: 1.015 (ref 1.000–1.030)
Total Protein, Urine: NEGATIVE
Urine Glucose: NEGATIVE
Urobilinogen, UA: 0.2 (ref 0.0–1.0)
pH: 7 (ref 5.0–8.0)

## 2022-09-03 LAB — LDL CHOLESTEROL, DIRECT: Direct LDL: 153 mg/dL

## 2022-09-03 LAB — CBC
HCT: 47.5 % (ref 39.0–52.0)
Hemoglobin: 16.3 g/dL (ref 13.0–17.0)
MCHC: 34.3 g/dL (ref 30.0–36.0)
MCV: 88.6 fl (ref 78.0–100.0)
Platelets: 257 10*3/uL (ref 150.0–400.0)
RBC: 5.36 Mil/uL (ref 4.22–5.81)
RDW: 13.4 % (ref 11.5–15.5)
WBC: 7.6 10*3/uL (ref 4.0–10.5)

## 2022-09-03 LAB — TSH: TSH: 3.07 u[IU]/mL (ref 0.35–5.50)

## 2022-09-03 LAB — HEMOGLOBIN A1C: Hgb A1c MFr Bld: 6.3 % (ref 4.6–6.5)

## 2022-09-03 LAB — PSA: PSA: 0.62 ng/mL (ref 0.10–4.00)

## 2022-09-03 NOTE — Assessment & Plan Note (Signed)
Encouraged DASH or MIND diet, decrease po intake and increase exercise as tolerated. Needs 7-8 hours of sleep nightly. Avoid trans fats, eat small, frequent meals every 4-5 hours with lean proteins, complex carbs and healthy fats. Minimize simple carbs, high fat foods and processed foods. Discussed medications but will not pursue at this time.

## 2022-09-03 NOTE — Assessment & Plan Note (Signed)
Trace hgb and ketones likely dehydration will repeat labs

## 2022-09-03 NOTE — Assessment & Plan Note (Signed)
Encouraged moist heat and gentle stretching as tolerated. May try NSAIDs and prescription meds as directed and report if symptoms worsen or seek immediate care and can use Tramadol prn sparingly refill given, UDS updated

## 2022-09-04 LAB — DRUG MONITORING PANEL 376104, URINE
Amphetamines: NEGATIVE ng/mL (ref <500)
Barbiturates: NEGATIVE ng/mL (ref <300)
Benzodiazepines: NEGATIVE ng/mL (ref <100)
Cocaine Metabolite: NEGATIVE ng/mL (ref <150)
Desmethyltramadol: 319 ng/mL — ABNORMAL HIGH (ref <100)
Opiates: NEGATIVE ng/mL (ref <100)
Oxycodone: NEGATIVE ng/mL (ref <100)
Tramadol: 529 ng/mL — ABNORMAL HIGH (ref <100)

## 2022-09-04 LAB — DM TEMPLATE

## 2022-09-04 NOTE — Addendum Note (Signed)
Addended by: Thelma Barge D on: 09/04/2022 10:29 AM   Modules accepted: Orders

## 2022-09-20 ENCOUNTER — Other Ambulatory Visit: Payer: Self-pay | Admitting: Family Medicine

## 2022-10-21 ENCOUNTER — Other Ambulatory Visit: Payer: Self-pay | Admitting: Family Medicine

## 2022-10-21 DIAGNOSIS — E349 Endocrine disorder, unspecified: Secondary | ICD-10-CM

## 2022-10-22 ENCOUNTER — Other Ambulatory Visit: Payer: Self-pay | Admitting: Family Medicine

## 2022-10-22 DIAGNOSIS — E349 Endocrine disorder, unspecified: Secondary | ICD-10-CM

## 2022-10-22 MED ORDER — TESTOSTERONE 10 MG/ACT (2%) TD GEL: 4.0000 | Freq: Every day | TRANSDERMAL | 5 refills | Status: DC

## 2022-11-03 DIAGNOSIS — L821 Other seborrheic keratosis: Secondary | ICD-10-CM | POA: Diagnosis not present

## 2022-11-14 ENCOUNTER — Ambulatory Visit: Payer: Self-pay

## 2022-11-14 ENCOUNTER — Ambulatory Visit: Payer: BC Managed Care – PPO | Admitting: Sports Medicine

## 2022-11-14 ENCOUNTER — Ambulatory Visit: Payer: BC Managed Care – PPO | Admitting: Medical

## 2022-11-14 ENCOUNTER — Ambulatory Visit (INDEPENDENT_AMBULATORY_CARE_PROVIDER_SITE_OTHER): Payer: BC Managed Care – PPO

## 2022-11-14 VITALS — BP 110/80 | HR 69 | Ht 69.0 in | Wt 260.0 lb

## 2022-11-14 DIAGNOSIS — M25551 Pain in right hip: Secondary | ICD-10-CM | POA: Diagnosis not present

## 2022-11-14 DIAGNOSIS — M1611 Unilateral primary osteoarthritis, right hip: Secondary | ICD-10-CM | POA: Diagnosis not present

## 2022-11-14 NOTE — Progress Notes (Signed)
Dustin Spears D.[place] [facility] [place] Phone: [phone]   Assessment and Plan:     1. Right hip pain 2. Primary osteoarthritis of right hip  -Chronic with exacerbation, initial sports medicine visit - Most consistent with flare of osteoarthritis of right hip based on HPI, physical exam, x-ray imaging - X-rays obtained in clinic.  My interpretation: No acute fracture or dislocation.  Cortical changes within right femoral acetabular joint with cortical sclerosis, free-floating cortical densities - Patient has been using Voltaren nightly without significant improvement in symptoms, so declined NSAID course at this time - Patient elected for intra-articular right hip CSI.  Tolerated well per note below - Start Tylenol 500 to 1000 mg tablets 2-3 times a day for day-to-day pain relief.  May use Voltaren tablet as needed for breakthrough pain, though recommend limiting to no more than 1-2 doses per week to prevent long-term side effects - Start HEP  Procedure: Ultrasound Guided Hip Acetabulofemoral Joint Injection Side: Right Diagnosis: Flare of osteoarthritis [place] Indication:  - accuracy is paramount for diagnosis - to ensure therapeutic efficacy or procedural success - to reduce procedural risk  After explaining the procedure, viable alternatives, risks, and answering any questions, consent was given verbally. The site was cleaned with chlorhexidine prep. An ultrasound transducer was placed on the anterior thigh/hip.   The acetabular joint, labrum, and femoral shaft were identified.  The neurovascular structures were identified and an approach was found specifically avoiding these structures.  A steroid injection was performed under ultrasound guidance with sterile technique using 40ml of 1% lidocaine without epinephrine and 40 mg of triamcinolone (KENALOG) 40mg /ml. This was well tolerated and resulted in  relief.  Needle was removed  and dressing placed and post injection instructions were given including  a discussion of likely return of pain today after the anesthetic wears off (with the possibility of worsened pain) until the steroid starts to work in 1-3 days.   Pt was advised to call or return to clinic if these symptoms worsen or fail to improve as anticipated.   Pertinent previous records reviewed include none   Follow Up: 3 to 4 weeks for reevaluation   Subjective:   I, Dustin Spears, am serving as a Education administrator for Doctor Glennon Mac  Chief Complaint: right hip pain   HPI:   11/14/22 Patient is a 57 year old male complaining of right hip pain. Patient states that he has pain fr the last 4-5 years has aches and intermittent pain , the last couple of weeks his pain has increased, has pain sitting in chairs more recliners, pain radiates down to his calf, pain when driving, no numbness or tingling, voltaren at night and that helps with his pain he is able to sleep, does have tramadol only takes it occasionally, walking helps with the pain , feels like his hip wants to give out but he can walk it out and will be fine  Relevant Historical Information: Hypothyroidism  Additional pertinent review of systems negative.   Current Outpatient Medications:    Calcium Citrate-Vitamin D 200-250 MG-UNIT TABS, Take 1 capsule by mouth daily., Disp: 120 tablet, Rfl: 0   cetirizine (ZYRTEC) 10 MG tablet, Take 10 mg by mouth daily. Reported on 01/03/2016, Disp: , Rfl:    Coenzyme Q10 (CO Q 10 PO), Take by mouth., Disp: , Rfl:    diclofenac (VOLTAREN) 75 MG EC tablet, TAKE 1 TABLET BY MOUTH 2 TIMES DAILY AS  NEEDED FOR MODERATE PAIN., Disp: 180 tablet, Rfl: 1   fish oil-omega-3 fatty acids 1000 MG capsule, Take 2 g by mouth daily., Disp: , Rfl:    levothyroxine (SYNTHROID) 50 MCG tablet, TAKE 1 TABLET BY MOUTH EVERY DAY, Disp: 85 tablet, Rfl: 1   MAGNESIUM PO, Take by mouth., Disp: , Rfl:    Misc. Devices (NASADOCK) MISC, by Does  not apply route., Disp: , Rfl:    Multiple Vitamins-Minerals (ZINC PO), Take by mouth., Disp: , Rfl:    rosuvastatin (CRESTOR) 5 MG tablet, TAKE 1 TABLET (5 MG TOTAL) BY MOUTH DAILY., Disp: 90 tablet, Rfl: 1   Testosterone 10 MG/ACT (2%) GEL, Apply 4 Pump topically daily., Disp: 60 g, Rfl: 5   traMADol (ULTRAM) 50 MG tablet, Take 1 tablet (50 mg total) by mouth every 12 (twelve) hours as needed., Disp: 30 tablet, Rfl: 0   UNABLE TO FIND, Med Name: Tumeric, Disp: , Rfl:    Wheat Dextrin (BENEFIBER) POWD, 2 tsp po daily in liquids, Disp: 730 g, Rfl: 0   Objective:     Vitals:   11/14/22 1050  BP: 110/80  Pulse: 69  SpO2: 97%  Weight: 260 lb (117.9 kg)  Height: 5\' 9"  (1.753 m)      Body mass index is 38.4 kg/m.    Physical Exam:    General: awake, alert, and oriented no acute distress, nontoxic Skin: no suspicious lesions or rashes Neuro:sensation intact distally with no deficits, normal muscle tone, no atrophy, strength 5/5 in all tested lower ext groups Psych: normal mood and affect, speech clear   Right hip: No deformity, swelling or wasting ROM Flexion 90, ext 30, IR 45, ER 45 NTTP over the hip flexors, greater trochanter, gluteal musculature, si joint, lumbar spine Negative log roll with FROM Negative FABER Positive FADIR Positive piriformis test for lateral hip and groin pain, without radicular symptoms Negative trendelenberg Gait normal    Electronically signed by:  Dustin Spears D.[facility] 11:52 AM 11/14/22

## 2022-11-14 NOTE — Patient Instructions (Addendum)
Good to see you  Tylenol [phone] mg 2-3 times a day for pain relief  Hip HEP  May use Voltaren as needed for breakthrough pain no more than 1-2 times per week  3-4 week follow up

## 2022-11-25 NOTE — Progress Notes (Unsigned)
    [name] D.[place] Phone: [phone]   Assessment and Plan:     There are no diagnoses linked to this encounter.  ***   Pertinent previous records reviewed include ***   Follow Up: ***     Subjective:   I, Mayer Vondrak, am serving as a Education administrator for Doctor Glennon Mac   Chief Complaint: right hip pain    HPI:    11/14/22 Patient is a 57 year old male complaining of right hip pain. Patient states that he has pain fr the last 4-5 years has aches and intermittent pain , the last couple of weeks his pain has increased, has pain sitting in chairs more recliners, pain radiates down to his calf, pain when driving, no numbness or tingling, voltaren at night and that helps with his pain he is able to sleep, does have tramadol only takes it occasionally, walking helps with the pain , feels like his hip wants to give out but he can walk it out and will be fine  11/26/2022 Patient states     Relevant Historical Information: Hypothyroidism  Additional pertinent review of systems negative.   Current Outpatient Medications:    Calcium Citrate-Vitamin D 200-250 MG-UNIT TABS, Take 1 capsule by mouth daily., Disp: 120 tablet, Rfl: 0   cetirizine (ZYRTEC) 10 MG tablet, Take 10 mg by mouth daily. Reported on 01/03/2016, Disp: , Rfl:    Coenzyme Q10 (CO Q 10 PO), Take by mouth., Disp: , Rfl:    diclofenac (VOLTAREN) 75 MG EC tablet, TAKE 1 TABLET BY MOUTH 2 TIMES DAILY AS NEEDED FOR MODERATE PAIN., Disp: 180 tablet, Rfl: 1   fish oil-omega-3 fatty acids 1000 MG capsule, Take 2 g by mouth daily., Disp: , Rfl:    levothyroxine (SYNTHROID) 50 MCG tablet, TAKE 1 TABLET BY MOUTH EVERY DAY, Disp: 85 tablet, Rfl: 1   MAGNESIUM PO, Take by mouth., Disp: , Rfl:    Misc. Devices (NASADOCK) MISC, by Does not apply route., Disp: , Rfl:    Multiple Vitamins-Minerals (ZINC PO), Take by mouth., Disp: , Rfl:     rosuvastatin (CRESTOR) 5 MG tablet, TAKE 1 TABLET (5 MG TOTAL) BY MOUTH DAILY., Disp: 90 tablet, Rfl: 1   Testosterone 10 MG/ACT (2%) GEL, Apply 4 Pump topically daily., Disp: 60 g, Rfl: 5   traMADol (ULTRAM) 50 MG tablet, Take 1 tablet (50 mg total) by mouth every 12 (twelve) hours as needed., Disp: 30 tablet, Rfl: 0   UNABLE TO FIND, Med Name: Tumeric, Disp: , Rfl:    Wheat Dextrin (BENEFIBER) POWD, 2 tsp po daily in liquids, Disp: 730 g, Rfl: 0   Objective:     There were no vitals filed for this visit.    There is no height or weight on file to calculate BMI.    Physical Exam:    ***   Electronically signed by:  [name] D.[facility] 12:25 PM 11/25/22

## 2022-11-26 ENCOUNTER — Ambulatory Visit (INDEPENDENT_AMBULATORY_CARE_PROVIDER_SITE_OTHER): Payer: BC Managed Care – PPO

## 2022-11-26 ENCOUNTER — Ambulatory Visit: Payer: BC Managed Care – PPO | Admitting: Sports Medicine

## 2022-11-26 VITALS — BP 132/80 | HR 68 | Ht 69.0 in | Wt 264.0 lb

## 2022-11-26 DIAGNOSIS — M5441 Lumbago with sciatica, right side: Secondary | ICD-10-CM | POA: Diagnosis not present

## 2022-11-26 DIAGNOSIS — M545 Low back pain, unspecified: Secondary | ICD-10-CM | POA: Diagnosis not present

## 2022-11-26 DIAGNOSIS — M1120 Other chondrocalcinosis, unspecified site: Secondary | ICD-10-CM

## 2022-11-26 DIAGNOSIS — G8929 Other chronic pain: Secondary | ICD-10-CM | POA: Diagnosis not present

## 2022-11-26 DIAGNOSIS — M25551 Pain in right hip: Secondary | ICD-10-CM

## 2022-11-26 MED ORDER — METHYLPREDNISOLONE 4 MG PO TBPK: ORAL_TABLET | ORAL | 0 refills | Status: DC

## 2022-11-26 NOTE — Patient Instructions (Addendum)
Good to see you  PT referral Prednisone dos pak  3-4 week follow up

## 2022-12-02 DIAGNOSIS — M5441 Lumbago with sciatica, right side: Secondary | ICD-10-CM | POA: Diagnosis not present

## 2022-12-05 ENCOUNTER — Ambulatory Visit: Payer: BC Managed Care – PPO | Admitting: Sports Medicine

## 2022-12-09 DIAGNOSIS — M5441 Lumbago with sciatica, right side: Secondary | ICD-10-CM | POA: Diagnosis not present

## 2022-12-17 ENCOUNTER — Ambulatory Visit: Payer: BC Managed Care – PPO | Admitting: Sports Medicine

## 2022-12-18 ENCOUNTER — Ambulatory Visit: Payer: BC Managed Care – PPO | Admitting: Sports Medicine

## 2022-12-18 VITALS — BP 130/86 | HR 79 | Ht 69.0 in | Wt 264.0 lb

## 2022-12-18 DIAGNOSIS — M5441 Lumbago with sciatica, right side: Secondary | ICD-10-CM

## 2022-12-18 DIAGNOSIS — G8929 Other chronic pain: Secondary | ICD-10-CM | POA: Diagnosis not present

## 2022-12-18 DIAGNOSIS — M25551 Pain in right hip: Secondary | ICD-10-CM | POA: Diagnosis not present

## 2022-12-18 DIAGNOSIS — M1120 Other chondrocalcinosis, unspecified site: Secondary | ICD-10-CM | POA: Diagnosis not present

## 2022-12-18 MED ORDER — GABAPENTIN 100 MG PO CAPS: ORAL_CAPSULE | ORAL | 0 refills | Status: DC

## 2022-12-18 MED ORDER — GABAPENTIN 100 MG PO CAPS: 100.0000 mg | ORAL_CAPSULE | Freq: Every day | ORAL | 0 refills | Status: DC

## 2022-12-18 MED ORDER — KETOROLAC TROMETHAMINE 60 MG/2ML IM SOLN
60.0000 mg | Freq: Once | INTRAMUSCULAR | Status: AC
  Administered 2022-12-18: 60 mg via INTRAMUSCULAR

## 2022-12-18 MED ORDER — METHYLPREDNISOLONE ACETATE 80 MG/ML IJ SUSP
80.0000 mg | Freq: Once | INTRAMUSCULAR | Status: AC
  Administered 2022-12-18: 80 mg via INTRAMUSCULAR

## 2022-12-18 NOTE — Addendum Note (Signed)
Addended by: Glennon Mac on: 12/18/2022 11:54 AM   Modules accepted: Orders

## 2022-12-18 NOTE — Patient Instructions (Addendum)
Good to see you  Gabapentin 100-200 mg nightly  MRI lumbar  Right hip  Follow up 3 days after to discuss results

## 2022-12-18 NOTE — Progress Notes (Signed)
Dustin Spears D.[place] Phone: [phone]   Assessment and Plan:     1. Chronic right-sided low back pain with right-sided sciatica 2. Right hip pain 3. Calcium pyrophosphate deposition disease (CPPD)  -Chronic with exacerbation, subsequent visit - Patient overall was having moderate improvement in symptoms until an additional recurrent flare over this past weekend, with symptoms currently the worst that they have been - Unclear etiology of continued right hip pain that is primarily posterior and radiating into right lower extremity.  Consistent with sciatica, though unclear if gluteal/piriformis versus lumbar in origin.  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  Injection given in clinic today and tolerated well. - May start gabapentin 100 to 200 mg nightly as needed.  New prescription provided today  Pertinent previous records reviewed include none   Follow Up: 3 days after MRI to review results and discuss treatment plan   Subjective:   I, Dustin Spears, am serving as a Education administrator for Doctor Glennon Mac   Chief Complaint: right hip pain    HPI:    11/14/22 Patient is a 57 year old male complaining of right hip pain. Patient states that he has pain fr the last 4-5 years has aches and intermittent pain , the last couple of weeks his pain has increased, has pain sitting in chairs more recliners, pain radiates down to his calf, pain when  driving, no numbness or tingling, voltaren at night and that helps with his pain he is able to sleep, does have tramadol only takes it occasionally, walking helps with the pain , feels like his hip wants to give out but he can walk it out and will be fine   11/26/2022 Patient states that he is still in pain , Sunday he went into a flare    12/18/2022 Patient states that he is in pain , hurts to sit     Relevant Historical Information: Hypothyroidism Additional pertinent review of systems negative.   Current Outpatient Medications:    Calcium Citrate-Vitamin D 200-250 MG-UNIT TABS, Take 1 capsule by mouth daily., Disp: 120 tablet, Rfl: 0   cetirizine (ZYRTEC) 10 MG tablet, Take 10 mg by mouth daily. Reported on 01/03/2016, Disp: , Rfl:    Coenzyme Q10 (CO Q 10 PO), Take by mouth., Disp: , Rfl:    diclofenac (VOLTAREN) 75 MG EC tablet, TAKE 1 TABLET BY MOUTH 2 TIMES DAILY AS NEEDED FOR MODERATE PAIN., Disp: 180 tablet, Rfl: 1   fish oil-omega-3 fatty acids 1000 MG capsule, Take 2 g by mouth daily., Disp: , Rfl:    gabapentin (NEURONTIN) 100 MG capsule, Take 1 capsule (100 mg total) by mouth at bedtime., Disp: 60 capsule, Rfl: 0   levothyroxine (SYNTHROID) 50 MCG tablet, TAKE 1 TABLET BY MOUTH EVERY DAY, Disp: 85 tablet, Rfl: 1   MAGNESIUM PO, Take by mouth., Disp: , Rfl:    methylPREDNISolone (MEDROL DOSEPAK) 4 MG TBPK tablet, Take 6 tablets on day 1.  Take 5 tablets on day 2.  Take 4  tablets on day 3.  Take 3 tablets on day 4.  Take 2 tablets on day 5.  Take 1 tablet on day 6., Disp: 21 tablet, Rfl: 0   Misc. Devices (NASADOCK) MISC, by Does not apply route., Disp: , Rfl:    Multiple Vitamins-Minerals (ZINC PO), Take by mouth., Disp: , Rfl:    rosuvastatin (CRESTOR) 5 MG tablet, TAKE 1 TABLET (5 MG TOTAL) BY MOUTH DAILY., Disp: 90 tablet, Rfl: 1   Testosterone 10 MG/ACT (2%) GEL, Apply 4 Pump topically daily., Disp: 60 g, Rfl: 5   traMADol (ULTRAM) 50 MG tablet, Take 1 tablet (50 mg total) by  mouth every 12 (twelve) hours as needed., Disp: 30 tablet, Rfl: 0   UNABLE TO FIND, Med Name: Tumeric, Disp: , Rfl:    Wheat Dextrin (BENEFIBER) POWD, 2 tsp po daily in liquids, Disp: 730 g, Rfl: 0   Objective:     Vitals:   12/18/22 1115  BP: 130/86  Pulse: 79  SpO2: 99%  Weight: 264 lb (119.7 kg)  Height: '5\' 9"'$  (1.753 m)      Body mass index is 38.99 kg/m.    Physical Exam:    General: awake, alert, and oriented no acute distress, nontoxic Skin: no suspicious lesions or rashes Neuro:sensation intact distally with no deficits, normal muscle tone, no atrophy, strength 5/5 in all tested lower ext groups Psych: normal mood and affect, speech clear   Right hip: No deformity, swelling or wasting ROM Flexion 90, ext 30, IR 45, ER 45 TTP greater trochanter, gluteal musculature, right lumbar paraspinal NTTP over the hip flexors,  si joint,   Negative log roll with FROM Negative FABER Positive FADIR Positive piriformis test for gluteal tightness Positive right trendelenberg Gait antalgic, favoring left leg Positive straight leg raise on right   Electronically signed by:  Dustin Spears D.[facility] 11:36 AM 12/18/22

## 2022-12-19 ENCOUNTER — Other Ambulatory Visit: Payer: Self-pay | Admitting: Sports Medicine

## 2022-12-19 DIAGNOSIS — Z0189 Encounter for other specified special examinations: Secondary | ICD-10-CM

## 2022-12-22 ENCOUNTER — Encounter: Payer: Self-pay | Admitting: Sports Medicine

## 2022-12-23 ENCOUNTER — Telehealth: Payer: Self-pay | Admitting: Sports Medicine

## 2022-12-23 ENCOUNTER — Other Ambulatory Visit: Payer: Self-pay | Admitting: Sports Medicine

## 2022-12-23 DIAGNOSIS — F40240 Claustrophobia: Secondary | ICD-10-CM

## 2022-12-23 MED ORDER — CYCLOBENZAPRINE HCL 5 MG PO TABS: 5.0000 mg | ORAL_TABLET | Freq: Every day | ORAL | 0 refills | Status: DC

## 2022-12-23 MED ORDER — LORAZEPAM 0.5 MG PO TABS: ORAL_TABLET | ORAL | 0 refills | Status: DC

## 2022-12-23 NOTE — Progress Notes (Signed)
Patient is continue to have daily pain that is not well-controlled on current pain medicine regimen.  We will change patient's pain management to the following:  - Increase to gabapentin 400 mg twice daily - Continue to use Flexeril 5 to 10 mg at night as needed for muscle spasms and can use an additional Flexeril 5 to 10 mg during the day as needed.  Refill sent in today. - Start Tylenol 500 to 1000 mg tablets 2-3 times a day for day-to-day pain relief - Will prescribe Ativan 0.5 mg to be used as needed for comfort and claustrophobia during MRI.  Order placed today

## 2022-12-23 NOTE — Telephone Encounter (Signed)
Rx placed pt can pick up at pharmacy

## 2022-12-23 NOTE — Telephone Encounter (Signed)
Na

## 2022-12-23 NOTE — Telephone Encounter (Signed)
Per MyChart communication today:  Patient would like to try the Flexeril. Pt scheduled to see Dr. Georgina Snell 3/13 for MRI f/u.  Pt worried about discomfort during MRI, advised him to try the new Flexeril for a couple of days and let us know by Friday how he is doing.

## 2022-12-25 ENCOUNTER — Other Ambulatory Visit: Payer: Self-pay | Admitting: Sports Medicine

## 2022-12-25 MED ORDER — GABAPENTIN 300 MG PO CAPS: 300.0000 mg | ORAL_CAPSULE | Freq: Three times a day (TID) | ORAL | 0 refills | Status: DC

## 2022-12-25 NOTE — Progress Notes (Unsigned)
gabapentin 300 mg 3 times a day.  90 pills, 0 refills.

## 2022-12-26 ENCOUNTER — Other Ambulatory Visit (HOSPITAL_BASED_OUTPATIENT_CLINIC_OR_DEPARTMENT_OTHER): Payer: BC Managed Care – PPO | Admitting: Radiology

## 2022-12-27 ENCOUNTER — Other Ambulatory Visit: Payer: BC Managed Care – PPO

## 2022-12-31 ENCOUNTER — Ambulatory Visit: Payer: BC Managed Care – PPO | Admitting: Family Medicine

## 2023-01-01 ENCOUNTER — Other Ambulatory Visit (HOSPITAL_BASED_OUTPATIENT_CLINIC_OR_DEPARTMENT_OTHER): Payer: BC Managed Care – PPO

## 2023-01-01 ENCOUNTER — Ambulatory Visit (HOSPITAL_BASED_OUTPATIENT_CLINIC_OR_DEPARTMENT_OTHER)
Admission: RE | Admit: 2023-01-01 | Discharge: 2023-01-01 | Disposition: A | Discharge status: Home / Self Care | Payer: BC Managed Care – PPO | Source: Ambulatory Visit | Attending: Sports Medicine | Admitting: Sports Medicine

## 2023-01-01 DIAGNOSIS — M25551 Pain in right hip: Secondary | ICD-10-CM

## 2023-01-01 DIAGNOSIS — G8929 Other chronic pain: Secondary | ICD-10-CM | POA: Diagnosis not present

## 2023-01-01 DIAGNOSIS — M5441 Lumbago with sciatica, right side: Secondary | ICD-10-CM | POA: Diagnosis not present

## 2023-01-01 DIAGNOSIS — Z0189 Encounter for other specified special examinations: Secondary | ICD-10-CM | POA: Diagnosis not present

## 2023-01-01 DIAGNOSIS — M5126 Other intervertebral disc displacement, lumbar region: Secondary | ICD-10-CM | POA: Diagnosis not present

## 2023-01-01 DIAGNOSIS — M1120 Other chondrocalcinosis, unspecified site: Secondary | ICD-10-CM

## 2023-01-01 DIAGNOSIS — M1611 Unilateral primary osteoarthritis, right hip: Secondary | ICD-10-CM | POA: Diagnosis not present

## 2023-01-01 DIAGNOSIS — Z135 Encounter for screening for eye and ear disorders: Secondary | ICD-10-CM | POA: Diagnosis not present

## 2023-01-04 ENCOUNTER — Other Ambulatory Visit: Payer: BC Managed Care – PPO

## 2023-01-06 NOTE — Progress Notes (Unsigned)
Dustin Goltz, PhD, LAT, ATC acting as a scribe for Dustin Leader, MD.  Dustin Spears is a 57 y.o. male who presents to [place] at [facility] today for f/u LBP and R hip w/ MRI's review. Pt was last seen by Dr. Glennon Spears on 12/18/22 and was given a methylprednisone 80 mg/Toradol 60 mg IM injection and was prescribed gabapentin. Pt completed prior course of PT at [facility]. Today, pt reports worsening of sx since last visit. Woke at 2:30 AM today with intense calf pain, minimal improvement with Gabapentin and Tylenol. Hasn't been able to sleep more than 4-5 hours at a time over the past week to week and a half. Denies swelling, warmth, or erythema in the LE.   Gabapentin dose is up to 300 mg at bedtime.  Dx imaging: 01/01/2023 R hip & L-spine MRI  11/26/2022 L-spine x-ray  11/14/2022 R hip x-ray  01/18/2015 R hip x-ray  Pertinent review of systems: No fevers or chills  Relevant historical information: Sleep apnea   Exam:  BP (!) 140/88   Pulse 72   Ht 5\' 9"  (1.753 m)   Wt 263 lb (119.3 kg)   SpO2 97%   BMI 38.84 kg/m  General: Well Developed, well nourished, and in no acute distress.   MSK: L-spine: Nontender midline.  Normal lumbar motion lower extremity strength is intact.    Lab and Radiology Results  EXAM: MRI LUMBAR SPINE WITHOUT CONTRAST   TECHNIQUE: Multiplanar, multisequence MR imaging of the lumbar spine was performed. No intravenous contrast was administered.   COMPARISON:  None Available.   FINDINGS: Segmentation:  Standard.   Alignment: Minimal grade 1 anterolisthesis of L4 on L5.   Vertebrae: No acute fracture, evidence of discitis, or aggressive bone lesion.   Conus medullaris and cauda equina: Conus extends to the T12-L1 level. Conus and cauda equina appear normal.   Paraspinal and other soft tissues: No acute paraspinal abnormality.   Disc levels:   Disc spaces: Disc desiccation with minimal disc height loss at L3-4 and  L4-5.   T11-12: Mild broad-based disc bulge. No foraminal or central canal stenosis.   T12-L1: No significant disc bulge. Mild bilateral facet arthropathy. No foraminal or central canal stenosis.   L1-L2: No significant disc bulge. Mild bilateral facet arthropathy. No foraminal or central canal stenosis.   L2-L3: Mild broad-based disc bulge. Mild bilateral facet arthropathy. No foraminal or central canal stenosis.   L3-L4: No significant disc bulge. Mild bilateral facet arthropathy. No foraminal or central canal stenosis.   L4-L5: Mild broad-based disc bulge. Moderate bilateral facet arthropathy with a focal area of right ligamentum flavum infolding versus an osteophyte resulting in subarticular recess stenosis with possible impingement of the right L5 nerve root. Mild spinal stenosis. No foraminal stenosis.   L5-S1: No significant disc bulge. No neural foraminal stenosis. No central canal stenosis. Mild-moderate bilateral facet arthropathy.   IMPRESSION: 1. At L4-5 there is a mild broad-based disc bulge. Moderate bilateral facet arthropathy with a focal area of right ligamentum flavum infolding versus an osteophyte resulting in subarticular recess stenosis with possible impingement of the right L5 nerve root. Mild spinal stenosis. 2. No acute osseous injury of the lumbar spine.     Electronically Signed   By: Dustin Spears M.D.   On: 01/05/2023 09:38   EXAM: MR OF THE RIGHT HIP WITHOUT CONTRAST   TECHNIQUE: Multiplanar, multisequence MR imaging was performed. No intravenous contrast was administered.   COMPARISON:  None Available.  FINDINGS: Bones:   No hip fracture, dislocation or avascular necrosis.   No periosteal reaction or bone destruction. No aggressive osseous lesion.   Normal sacrum and sacroiliac joints. No SI joint widening or erosive changes.   Mild degenerative disease with disc height loss at L4-5.   Articular cartilage and labrum    Articular cartilage: Mild partial-thickness cartilage loss of the right femoral head and acetabulum.   Labrum: Right superior labral degeneration with a posterosuperior labral tear.   Joint or bursal effusion   Joint effusion:  No hip joint effusion.  No SI joint effusion.   Bursae:  No bursal fluid.   Muscles and tendons   Flexors: Normal.   Extensors: Normal.   Abductors: Normal.   Adductors: Normal.   Gluteals: Normal.   Hamstrings: Mild tendinosis of the right hamstring origin with a tiny interstitial tear.   Other findings   No pelvic free fluid. No fluid collection or hematoma. No inguinal lymphadenopathy. No inguinal hernia.   IMPRESSION: 1. 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 2. Mild osteoarthritis of the right hip. 3. Mild tendinosis of the right hamstring origin with a tiny interstitial tear.     Electronically Signed   By: Dustin Spears M.D.   On: 01/05/2023 07:28    I, Dustin Spears, personally (independently) visualized and performed the interpretation of the images attached in this note.     Assessment and Plan: 57 y.o. male with right hip and leg pain.  Dominant issue today is right L5 lumbar radiculopathy.  This corresponds with his MRI findings from March 14.  Plan for epidural steroid injection.  Will increase gabapentin up to 600 mg at bedtime.  Refilled Flexeril as well.  He does have some anterior hip pain which could be due to the DJD and labrum tear seen on MRI.  Unfortunately not much to do for this.  He is already had a hip injection which did not provide lasting relief.  Ultimately he may need a hip replacement when he gets bad enough.  Reviewed the hip and lumbar spine MRI.  Answered questions. Follow-up with Dr. Glennon Spears.   PDMP not reviewed this encounter. Orders Placed This Encounter  Procedures   DG INJECT DIAG/THERA/INC NEEDLE/CATH/PLC EPI/LUMB/SAC W/IMG    Level and technique per  radiology, anticipate R L5    Standing Status:   Future    Standing Expiration Date:   02/07/2023    Order Specific Question:   Reason for Exam (SYMPTOM  OR DIAGNOSIS REQUIRED)    Answer:   Low back pain    Order Specific Question:   Preferred Imaging Location?    Answer:   [id number]   Meds ordered this encounter  Medications   cyclobenzaprine (FLEXERIL) 5 MG tablet    Sig: Take 1-2 tablets (5-10 mg total) by mouth at bedtime as needed for muscle spasms.    Dispense:  60 tablet    Refill:  1     Discussed warning signs or symptoms. Please see discharge instructions. Patient expresses understanding.   The above documentation has been reviewed and is accurate and complete Dustin Spears, M.D. Total encounter time 30 minutes including face-to-face time with the patient and, reviewing past medical record, and charting on the date of service.

## 2023-01-07 ENCOUNTER — Encounter: Payer: Self-pay | Admitting: Family Medicine

## 2023-01-07 ENCOUNTER — Ambulatory Visit: Payer: BC Managed Care – PPO | Admitting: Family Medicine

## 2023-01-07 VITALS — BP 140/88 | HR 72 | Ht 69.0 in | Wt 263.0 lb

## 2023-01-07 DIAGNOSIS — G8929 Other chronic pain: Secondary | ICD-10-CM | POA: Diagnosis not present

## 2023-01-07 DIAGNOSIS — M5441 Lumbago with sciatica, right side: Secondary | ICD-10-CM

## 2023-01-07 DIAGNOSIS — M25551 Pain in right hip: Secondary | ICD-10-CM

## 2023-01-07 MED ORDER — CYCLOBENZAPRINE HCL 5 MG PO TABS: 5.0000 mg | ORAL_TABLET | Freq: Every evening | ORAL | 1 refills | Status: DC | PRN

## 2023-01-07 NOTE — Patient Instructions (Addendum)
Thank you for coming in today.   Ok to increase Gabapentin 600mg   Please call [facility] at [phone] to schedule your spine injection.    Check back about 1-2 weeks after injection with Dr. Glennon Mac

## 2023-01-09 ENCOUNTER — Ambulatory Visit
Admission: RE | Admit: 2023-01-09 | Discharge: 2023-01-09 | Disposition: A | Discharge status: Home / Self Care | Payer: BC Managed Care – PPO | Source: Ambulatory Visit | Attending: Family Medicine | Admitting: Family Medicine

## 2023-01-09 DIAGNOSIS — G8929 Other chronic pain: Secondary | ICD-10-CM

## 2023-01-09 DIAGNOSIS — M5416 Radiculopathy, lumbar region: Secondary | ICD-10-CM | POA: Diagnosis not present

## 2023-01-09 MED ORDER — IOPAMIDOL (ISOVUE-M 200) INJECTION 41%
1.0000 mL | Freq: Once | INTRAMUSCULAR | Status: AC
  Administered 2023-01-09: 1 mL via EPIDURAL

## 2023-01-09 MED ORDER — METHYLPREDNISOLONE ACETATE 40 MG/ML INJ SUSP (RADIOLOG
80.0000 mg | Freq: Once | INTRAMUSCULAR | Status: AC
  Administered 2023-01-09: 80 mg via EPIDURAL

## 2023-01-09 NOTE — Discharge Instructions (Signed)

## 2023-01-16 ENCOUNTER — Other Ambulatory Visit: Payer: Self-pay | Admitting: Family Medicine

## 2023-01-21 NOTE — Progress Notes (Unsigned)
Dustin Spears D.[place] Phone: [phone]   Assessment and Plan:     There are no diagnoses linked to this encounter.  ***   Pertinent previous records reviewed include ***   Follow Up: ***     Subjective:   I, Dustin Spears, am serving as a Education administrator for Doctor Glennon Mac  Chief Complaint: right hip pain   HPI:   11/14/22 Patient is a 57 year old male complaining of right hip pain. Patient states that he has pain fr the last 4-5 years has aches and intermittent pain , the last couple of weeks his pain has increased, has pain sitting in chairs more recliners, pain radiates down to his calf, pain when driving, no numbness or tingling, voltaren at night and that helps with his pain he is able to sleep, does have tramadol only takes it occasionally, walking helps with the pain , feels like his hip wants to give out but he can walk it out and will be fine   11/26/2022 Patient states that he is still in pain , Sunday he went into a flare    12/18/2022 Patient states that he is in pain , hurts to sit    01/07/2023 Dustin Spears is a 57 y.o. male who presents to [place] at [facility] today for f/u LBP and R hip w/ MRI's review. Pt was last seen by Dr. Glennon Mac on 12/18/22 and was given a methylprednisone 80 mg/Toradol 60 mg IM injection and was prescribed gabapentin. Pt completed prior course of PT at [facility]. Today, pt reports worsening of sx since last visit. Woke at 2:30 AM today with intense calf pain, minimal improvement with Gabapentin and Tylenol. Hasn't been able to sleep more than 4-5 hours at a time over the past week to week and a half. Denies swelling, warmth, or erythema in the LE.    Gabapentin dose is up to 300 mg at bedtime.   Dx imaging: 01/01/2023 R hip & L-spine MRI             11/26/2022 L-spine x-ray             11/14/2022 R hip x-ray             01/18/2015 R hip  x-ray  01/22/2023 Patient states  Relevant Historical Information: ***  Additional pertinent review of systems negative.   Current Outpatient Medications:    Calcium Citrate-Vitamin D 200-250 MG-UNIT TABS, Take 1 capsule by mouth daily., Disp: 120 tablet, Rfl: 0   cetirizine (ZYRTEC) 10 MG tablet, Take 10 mg by mouth daily. Reported on 01/03/2016, Disp: , Rfl:    Coenzyme Q10 (CO Q 10 PO), Take by mouth., Disp: , Rfl:    cyclobenzaprine (FLEXERIL) 5 MG tablet, Take 1-2 tablets (5-10 mg total) by mouth at bedtime as needed for muscle spasms., Disp: 60 tablet, Rfl: 1   diclofenac (VOLTAREN) 75 MG EC tablet, TAKE 1 TABLET BY MOUTH 2 TIMES DAILY AS NEEDED FOR MODERATE PAIN. (Patient not taking: Reported on 01/07/2023), Disp: 180 tablet, Rfl: 1   fish oil-omega-3 fatty acids 1000 MG capsule, Take 2 g by mouth daily., Disp: , Rfl:    gabapentin (NEURONTIN) 100 MG capsule, Use 1 to 2 tablets nightly as needed for symptom relief, Disp: 60 capsule, Rfl: 0   gabapentin (NEURONTIN) 300 MG capsule, Take 1 capsule (300 mg total) by mouth 3 (three) times daily., Disp: 90  capsule, Rfl: 0   levothyroxine (SYNTHROID) 50 MCG tablet, TAKE 1 TABLET BY MOUTH EVERY DAY, Disp: 85 tablet, Rfl: 1   LORazepam (ATIVAN) 0.5 MG tablet, 1-2 tabs 30 - 60 min prior to MRI. Do not drive with this medicine., Disp: 4 tablet, Rfl: 0   MAGNESIUM PO, Take by mouth., Disp: , Rfl:    methylPREDNISolone (MEDROL DOSEPAK) 4 MG TBPK tablet, Take 6 tablets on day 1.  Take 5 tablets on day 2.  Take 4 tablets on day 3.  Take 3 tablets on day 4.  Take 2 tablets on day 5.  Take 1 tablet on day 6. (Patient not taking: Reported on 01/07/2023), Disp: 21 tablet, Rfl: 0   Misc. Devices (NASADOCK) MISC, by Does not apply route., Disp: , Rfl:    Multiple Vitamins-Minerals (ZINC PO), Take by mouth., Disp: , Rfl:    rosuvastatin (CRESTOR) 5 MG tablet, TAKE 1 TABLET (5 MG TOTAL) BY MOUTH DAILY., Disp: 90 tablet, Rfl: 1   Testosterone 10 MG/ACT (2%) GEL,  Apply 4 Pump topically daily., Disp: 60 g, Rfl: 5   traMADol (ULTRAM) 50 MG tablet, Take 1 tablet (50 mg total) by mouth every 12 (twelve) hours as needed., Disp: 30 tablet, Rfl: 0   UNABLE TO FIND, Med Name: Tumeric, Disp: , Rfl:    Wheat Dextrin (BENEFIBER) POWD, 2 tsp po daily in liquids, Disp: 730 g, Rfl: 0   Objective:     There were no vitals filed for this visit.    There is no height or weight on file to calculate BMI.    Physical Exam:    ***   Electronically signed by:  Dustin Spears D.[facility] 7:15 AM 01/21/23

## 2023-01-22 ENCOUNTER — Ambulatory Visit (INDEPENDENT_AMBULATORY_CARE_PROVIDER_SITE_OTHER): Payer: BC Managed Care – PPO | Admitting: Sports Medicine

## 2023-01-22 VITALS — BP 124/80 | HR 66 | Ht 69.0 in | Wt 258.0 lb

## 2023-01-22 DIAGNOSIS — M9905 Segmental and somatic dysfunction of pelvic region: Secondary | ICD-10-CM

## 2023-01-22 DIAGNOSIS — G8929 Other chronic pain: Secondary | ICD-10-CM | POA: Diagnosis not present

## 2023-01-22 DIAGNOSIS — M9903 Segmental and somatic dysfunction of lumbar region: Secondary | ICD-10-CM | POA: Diagnosis not present

## 2023-01-22 DIAGNOSIS — M5441 Lumbago with sciatica, right side: Secondary | ICD-10-CM | POA: Diagnosis not present

## 2023-01-22 DIAGNOSIS — M48062 Spinal stenosis, lumbar region with neurogenic claudication: Secondary | ICD-10-CM | POA: Diagnosis not present

## 2023-01-22 DIAGNOSIS — M9904 Segmental and somatic dysfunction of sacral region: Secondary | ICD-10-CM

## 2023-01-22 NOTE — Patient Instructions (Addendum)
Good to see you  Repeat epidural right L4-L5

## 2023-01-23 ENCOUNTER — Ambulatory Visit
Admission: RE | Admit: 2023-01-23 | Discharge: 2023-01-23 | Disposition: A | Discharge status: Home / Self Care | Payer: BC Managed Care – PPO | Source: Ambulatory Visit | Attending: Sports Medicine | Admitting: Sports Medicine

## 2023-01-23 DIAGNOSIS — M4727 Other spondylosis with radiculopathy, lumbosacral region: Secondary | ICD-10-CM | POA: Diagnosis not present

## 2023-01-23 DIAGNOSIS — G8929 Other chronic pain: Secondary | ICD-10-CM

## 2023-01-23 DIAGNOSIS — M48062 Spinal stenosis, lumbar region with neurogenic claudication: Secondary | ICD-10-CM

## 2023-01-23 MED ORDER — IOPAMIDOL (ISOVUE-M 200) INJECTION 41%
1.0000 mL | Freq: Once | INTRAMUSCULAR | Status: AC
  Administered 2023-01-23: 1 mL via EPIDURAL

## 2023-01-23 MED ORDER — METHYLPREDNISOLONE ACETATE 40 MG/ML INJ SUSP (RADIOLOG
80.0000 mg | Freq: Once | INTRAMUSCULAR | Status: AC
  Administered 2023-01-23: 80 mg via EPIDURAL

## 2023-01-23 NOTE — Discharge Instructions (Signed)

## 2023-02-04 NOTE — Progress Notes (Unsigned)
Aleen Sells [facility] [address] [place] [address] Phone: [phone]   Assessment and Plan:     There are no diagnoses linked to this encounter.  ***   Pertinent previous records reviewed include ***   Follow Up: ***     Subjective:   I, Dustin Spears, am serving as a Neurosurgeon for Doctor Richardean Sale   Chief Complaint: right hip pain    HPI:   11/14/22 Patient is a 57 year old male complaining of right hip pain. Patient states that he has pain fr the last 4-5 years has aches and intermittent pain , the last couple of weeks his pain has increased, has pain sitting in chairs more recliners, pain radiates down to his calf, pain when driving, no numbness or tingling, voltaren at night and that helps with his pain he is able to sleep, does have tramadol only takes it occasionally, walking helps with the pain , feels like his hip wants to give out but he can walk it out and will be fine   11/26/2022 Patient states that he is still in pain , Sunday he went into a flare    12/18/2022 Patient states that he is in pain , hurts to sit     01/07/2023 Dustin Spears is a 57 y.o. male who presents to [facility] at [facility] today for f/u LBP and R hip w/ MRI's review. Pt was last seen by Dr. Jean Rosenthal on 12/18/22 and was given a methylprednisone 80 mg/Toradol 60 mg IM injection and was prescribed gabapentin. Pt completed prior course of PT at [facility]. Today, pt reports worsening of sx since last visit. Woke at 2:30 AM today with intense calf pain, minimal improvement with Gabapentin and Tylenol. Hasn't been able to sleep more than 4-5 hours at a time over the past week to week and a half. Denies swelling, warmth, or erythema in the LE.    Gabapentin dose is up to 300 mg at bedtime.   Dx imaging: 01/01/2023 R hip & L-spine MRI             11/26/2022 L-spine x-ray             11/14/2022 R hip x-ray             01/18/2015 R hip  x-ray   01/22/2023 Patient states that epidural flattened out , he has pain and can't sleep through the night and is uncomfortable at work , he has had to sleep on the floor , he has most pain in the morning , he does have antalgic gait    02/05/2023 Patient states    Relevant Historical Information: Hypothyroidism  Additional pertinent review of systems negative.   Current Outpatient Medications:    Calcium Citrate-Vitamin D 200-250 MG-UNIT TABS, Take 1 capsule by mouth daily., Disp: 120 tablet, Rfl: 0   cetirizine (ZYRTEC) 10 MG tablet, Take 10 mg by mouth daily. Reported on 01/03/2016, Disp: , Rfl:    Coenzyme Q10 (CO Q 10 PO), Take by mouth., Disp: , Rfl:    cyclobenzaprine (FLEXERIL) 5 MG tablet, Take 1-2 tablets (5-10 mg total) by mouth at bedtime as needed for muscle spasms., Disp: 60 tablet, Rfl: 1   diclofenac (VOLTAREN) 75 MG EC tablet, TAKE 1 TABLET BY MOUTH 2 TIMES DAILY AS NEEDED FOR MODERATE PAIN., Disp: 180 tablet, Rfl: 1   fish oil-omega-3 fatty acids 1000 MG capsule, Take 2 g by mouth daily.,  Disp: , Rfl:    gabapentin (NEURONTIN) 100 MG capsule, Use 1 to 2 tablets nightly as needed for symptom relief, Disp: 60 capsule, Rfl: 0   gabapentin (NEURONTIN) 300 MG capsule, Take 1 capsule (300 mg total) by mouth 3 (three) times daily., Disp: 90 capsule, Rfl: 0   levothyroxine (SYNTHROID) 50 MCG tablet, TAKE 1 TABLET BY MOUTH EVERY DAY, Disp: 85 tablet, Rfl: 1   LORazepam (ATIVAN) 0.5 MG tablet, 1-2 tabs 30 - 60 min prior to MRI. Do not drive with this medicine., Disp: 4 tablet, Rfl: 0   MAGNESIUM PO, Take by mouth., Disp: , Rfl:    methylPREDNISolone (MEDROL DOSEPAK) 4 MG TBPK tablet, Take 6 tablets on day 1.  Take 5 tablets on day 2.  Take 4 tablets on day 3.  Take 3 tablets on day 4.  Take 2 tablets on day 5.  Take 1 tablet on day 6., Disp: 21 tablet, Rfl: 0   Misc. Devices (NASADOCK) MISC, by Does not apply route., Disp: , Rfl:    Multiple Vitamins-Minerals (ZINC PO), Take by  mouth., Disp: , Rfl:    rosuvastatin (CRESTOR) 5 MG tablet, TAKE 1 TABLET (5 MG TOTAL) BY MOUTH DAILY., Disp: 90 tablet, Rfl: 1   Testosterone 10 MG/ACT (2%) GEL, Apply 4 Pump topically daily., Disp: 60 g, Rfl: 5   traMADol (ULTRAM) 50 MG tablet, Take 1 tablet (50 mg total) by mouth every 12 (twelve) hours as needed., Disp: 30 tablet, Rfl: 0   UNABLE TO FIND, Med Name: Tumeric, Disp: , Rfl:    Wheat Dextrin (BENEFIBER) POWD, 2 tsp po daily in liquids, Disp: 730 g, Rfl: 0   Objective:     There were no vitals filed for this visit.    There is no height or weight on file to calculate BMI.    Physical Exam:    ***   Electronically signed by:  Aleen Sells [facility] 7:24 AM 02/04/23

## 2023-02-05 ENCOUNTER — Ambulatory Visit: Payer: BC Managed Care – PPO | Admitting: Sports Medicine

## 2023-02-05 VITALS — BP 132/80 | HR 62 | Ht 69.0 in | Wt 258.0 lb

## 2023-02-05 DIAGNOSIS — G8929 Other chronic pain: Secondary | ICD-10-CM | POA: Diagnosis not present

## 2023-02-05 DIAGNOSIS — M48062 Spinal stenosis, lumbar region with neurogenic claudication: Secondary | ICD-10-CM | POA: Diagnosis not present

## 2023-02-05 DIAGNOSIS — M5441 Lumbago with sciatica, right side: Secondary | ICD-10-CM

## 2023-02-05 NOTE — Patient Instructions (Addendum)
Good to see you Repeat epidural right L4-L5 Follow up 2 weeks after to discuss results Continue gabapentin 600 mg nightly Can try gabapentin during the day to see if it relieves symptoms

## 2023-02-06 ENCOUNTER — Ambulatory Visit
Admission: RE | Admit: 2023-02-06 | Discharge: 2023-02-06 | Disposition: A | Discharge status: Home / Self Care | Payer: BC Managed Care – PPO | Source: Ambulatory Visit | Attending: Sports Medicine | Admitting: Sports Medicine

## 2023-02-06 ENCOUNTER — Other Ambulatory Visit: Payer: Self-pay | Admitting: Sports Medicine

## 2023-02-06 DIAGNOSIS — M48062 Spinal stenosis, lumbar region with neurogenic claudication: Secondary | ICD-10-CM

## 2023-02-06 DIAGNOSIS — G8929 Other chronic pain: Secondary | ICD-10-CM

## 2023-02-06 DIAGNOSIS — M4727 Other spondylosis with radiculopathy, lumbosacral region: Secondary | ICD-10-CM | POA: Diagnosis not present

## 2023-02-06 MED ORDER — METHYLPREDNISOLONE ACETATE 40 MG/ML INJ SUSP (RADIOLOG
80.0000 mg | Freq: Once | INTRAMUSCULAR | Status: AC
  Administered 2023-02-06: 80 mg via EPIDURAL

## 2023-02-06 MED ORDER — IOPAMIDOL (ISOVUE-M 200) INJECTION 41%
1.0000 mL | Freq: Once | INTRAMUSCULAR | Status: AC
  Administered 2023-02-06: 1 mL via EPIDURAL

## 2023-02-06 NOTE — Discharge Instructions (Signed)

## 2023-02-08 ENCOUNTER — Encounter: Payer: Self-pay | Admitting: Sports Medicine

## 2023-02-10 ENCOUNTER — Encounter: Payer: Self-pay | Admitting: Sports Medicine

## 2023-02-12 ENCOUNTER — Other Ambulatory Visit: Payer: Self-pay | Admitting: Sports Medicine

## 2023-02-17 NOTE — Progress Notes (Unsigned)
Dustin Spears D.[facility] [address] [place] [address] Phone: [phone]   Assessment and Plan:     There are no diagnoses linked to this encounter.  ***   Pertinent previous records reviewed include ***   Follow Up: ***     Subjective:   I, Dustin Spears, am serving as a Neurosurgeon for Doctor Richardean Sale   Chief Complaint: right hip pain    HPI:   11/14/22 Patient is a 57 year old male complaining of right hip pain. Patient states that he has pain fr the last 4-5 years has aches and intermittent pain , the last couple of weeks his pain has increased, has pain sitting in chairs more recliners, pain radiates down to his calf, pain when driving, no numbness or tingling, voltaren at night and that helps with his pain he is able to sleep, does have tramadol only takes it occasionally, walking helps with the pain , feels like his hip wants to give out but he can walk it out and will be fine   11/26/2022 Patient states that he is still in pain , Sunday he went into a flare    12/18/2022 Patient states that he is in pain , hurts to sit     01/07/2023 Dustin Spears is a 57 y.o. male who presents to [facility] at [facility] today for f/u LBP and R hip w/ MRI's review. Pt was last seen by Dr. Jean Rosenthal on 12/18/22 and was given a methylprednisone 80 mg/Toradol 60 mg IM injection and was prescribed gabapentin. Pt completed prior course of PT at [facility]. Today, pt reports worsening of sx since last visit. Woke at 2:30 AM today with intense calf pain, minimal improvement with Gabapentin and Tylenol. Hasn't been able to sleep more than 4-5 hours at a time over the past week to week and a half. Denies swelling, warmth, or erythema in the LE.    Gabapentin dose is up to 300 mg at bedtime.   Dx imaging: 01/01/2023 R hip & L-spine MRI             11/26/2022 L-spine x-ray             11/14/2022 R hip x-ray             01/18/2015 R hip  x-ray   01/22/2023 Patient states that epidural flattened out , he has pain and can't sleep through the night and is uncomfortable at work , he has had to sleep on the floor , he has most pain in the morning , he does have antalgic gait     02/05/2023 Patient states he feels better this epidural helped more than the last    02/18/2023 Patient states    Relevant Historical Information: Hypothyroidism  Additional pertinent review of systems negative.   Current Outpatient Medications:    Calcium Citrate-Vitamin D 200-250 MG-UNIT TABS, Take 1 capsule by mouth daily., Disp: 120 tablet, Rfl: 0   cetirizine (ZYRTEC) 10 MG tablet, Take 10 mg by mouth daily. Reported on 01/03/2016, Disp: , Rfl:    Coenzyme Q10 (CO Q 10 PO), Take by mouth., Disp: , Rfl:    cyclobenzaprine (FLEXERIL) 5 MG tablet, Take 1-2 tablets (5-10 mg total) by mouth at bedtime as needed for muscle spasms., Disp: 60 tablet, Rfl: 1   diclofenac (VOLTAREN) 75 MG EC tablet, TAKE 1 TABLET BY MOUTH 2 TIMES DAILY AS NEEDED FOR MODERATE PAIN., Disp: 180 tablet,  Rfl: 1   fish oil-omega-3 fatty acids 1000 MG capsule, Take 2 g by mouth daily., Disp: , Rfl:    gabapentin (NEURONTIN) 100 MG capsule, Use 1 to 2 tablets nightly as needed for symptom relief, Disp: 60 capsule, Rfl: 0   gabapentin (NEURONTIN) 300 MG capsule, TAKE 1 CAPSULE BY MOUTH THREE TIMES A DAY, Disp: 90 capsule, Rfl: 0   levothyroxine (SYNTHROID) 50 MCG tablet, TAKE 1 TABLET BY MOUTH EVERY DAY, Disp: 85 tablet, Rfl: 1   LORazepam (ATIVAN) 0.5 MG tablet, 1-2 tabs 30 - 60 min prior to MRI. Do not drive with this medicine., Disp: 4 tablet, Rfl: 0   MAGNESIUM PO, Take by mouth., Disp: , Rfl:    methylPREDNISolone (MEDROL DOSEPAK) 4 MG TBPK tablet, Take 6 tablets on day 1.  Take 5 tablets on day 2.  Take 4 tablets on day 3.  Take 3 tablets on day 4.  Take 2 tablets on day 5.  Take 1 tablet on day 6., Disp: 21 tablet, Rfl: 0   Misc. Devices (NASADOCK) MISC, by Does not apply route.,  Disp: , Rfl:    Multiple Vitamins-Minerals (ZINC PO), Take by mouth., Disp: , Rfl:    rosuvastatin (CRESTOR) 5 MG tablet, TAKE 1 TABLET (5 MG TOTAL) BY MOUTH DAILY., Disp: 90 tablet, Rfl: 1   Testosterone 10 MG/ACT (2%) GEL, Apply 4 Pump topically daily., Disp: 60 g, Rfl: 5   traMADol (ULTRAM) 50 MG tablet, Take 1 tablet (50 mg total) by mouth every 12 (twelve) hours as needed., Disp: 30 tablet, Rfl: 0   UNABLE TO FIND, Med Name: Tumeric, Disp: , Rfl:    Wheat Dextrin (BENEFIBER) POWD, 2 tsp po daily in liquids, Disp: 730 g, Rfl: 0   Objective:     There were no vitals filed for this visit.    There is no height or weight on file to calculate BMI.    Physical Exam:    ***   Electronically signed by:  Dustin Spears [facility] 7:15 AM 02/17/23

## 2023-02-18 ENCOUNTER — Ambulatory Visit: Payer: BC Managed Care – PPO | Admitting: Sports Medicine

## 2023-02-18 VITALS — BP 120/84 | HR 69 | Ht 69.0 in | Wt 256.0 lb

## 2023-02-18 DIAGNOSIS — M5441 Lumbago with sciatica, right side: Secondary | ICD-10-CM

## 2023-02-18 DIAGNOSIS — G8929 Other chronic pain: Secondary | ICD-10-CM | POA: Diagnosis not present

## 2023-02-18 DIAGNOSIS — M48062 Spinal stenosis, lumbar region with neurogenic claudication: Secondary | ICD-10-CM | POA: Diagnosis not present

## 2023-02-18 NOTE — Patient Instructions (Addendum)
Good to see you Recommend tylenol 1000 mg before bed Recommend gabapentin 600 mg nightly Recommend HEP before bed If pain return in less than 3 months call us for a referral to neurosurgery If pain returns after 3 months call us for a repeat epidural  As needed follow up

## 2023-03-08 NOTE — Assessment & Plan Note (Signed)
Encourage heart healthy diet such as MIND or DASH diet, increase exercise, avoid trans fats, simple carbohydrates and processed foods, consider a krill or fish or flaxseed oil cap daily.  He was tolerating his statin but has not been taking it to see if it helped his pain but it did not so he restarted it yesterday

## 2023-03-08 NOTE — Assessment & Plan Note (Signed)
hgba1c acceptable, minimize simple carbs. Increase exercise as tolerated.  

## 2023-03-09 ENCOUNTER — Ambulatory Visit: Payer: BC Managed Care – PPO | Admitting: Family Medicine

## 2023-03-09 VITALS — BP 120/82 | HR 79 | Temp 98.0°F | Resp 16 | Ht 69.0 in | Wt 257.6 lb

## 2023-03-09 DIAGNOSIS — Z0000 Encounter for general adult medical examination without abnormal findings: Secondary | ICD-10-CM

## 2023-03-09 DIAGNOSIS — E782 Mixed hyperlipidemia: Secondary | ICD-10-CM

## 2023-03-09 DIAGNOSIS — M5442 Lumbago with sciatica, left side: Secondary | ICD-10-CM

## 2023-03-09 DIAGNOSIS — R739 Hyperglycemia, unspecified: Secondary | ICD-10-CM | POA: Diagnosis not present

## 2023-03-09 DIAGNOSIS — Z23 Encounter for immunization: Secondary | ICD-10-CM | POA: Diagnosis not present

## 2023-03-09 DIAGNOSIS — R001 Bradycardia, unspecified: Secondary | ICD-10-CM

## 2023-03-09 DIAGNOSIS — E349 Endocrine disorder, unspecified: Secondary | ICD-10-CM

## 2023-03-09 DIAGNOSIS — E038 Other specified hypothyroidism: Secondary | ICD-10-CM | POA: Diagnosis not present

## 2023-03-09 MED ORDER — GABAPENTIN 100 MG PO CAPS: 100.0000 mg | ORAL_CAPSULE | Freq: Two times a day (BID) | ORAL | 1 refills | Status: DC | PRN

## 2023-03-09 NOTE — Assessment & Plan Note (Signed)
Supplement and monitor 

## 2023-03-09 NOTE — Assessment & Plan Note (Signed)
Injections in his back with sports medicine has been helpful and he finds the pain manageable at the current time. .Encouraged moist heat and gentle stretching as tolerated. May try NSAIDs and prescription meds as directed and report if symptoms worsen or seek immediate care

## 2023-03-09 NOTE — Patient Instructions (Addendum)
Shingrix is the new shingles shot, 2 shots over 2-6 months, confirm coverage with insurance and document, then can return here for shots with nurse appt or at pharmacy   Tremor A tremor is trembling or shaking that you cannot control. Most tremors affect the hands or arms. Tremors can also affect the head, vocal cords, face, and other parts of the body. There are many types of tremors. Common types include: Essential tremor. These usually occur in people older than 40. This type of tremor may run in families and can happen in otherwise healthy people. Resting tremor. These occur when the muscles are at rest, such as when your hands are resting in your lap. People with Parkinson's disease often have resting tremors. Postural tremor. These occur when you try to hold a pose, such as keeping your hands outstretched. Kinetic tremor. These occur during purposeful movement, such as trying to touch a finger to your nose. Task-specific tremor. These may occur when you do certain tasks such as writing, speaking, or standing. Psychogenic tremor. These are greatly reduced or go away when you are distracted. These tremors happen due to underlying stress or psychiatric disease. They can happen in people of all ages. Some types of tremors have no known cause. Tremors can also be a symptom of nervous system problems (neurological disorders) that may occur with aging. Some tremors go away with treatment, while others do not. Follow these instructions at home: Lifestyle     If you drink alcohol: Limit how much you have to: 0-1 drink a day for women who are not pregnant. 0-2 drinks a day for men. Know how much alcohol is in a drink. In the [place] one drink equals one 12 oz bottle of beer (355 mL), one 5 oz glass of wine (148 mL), or one 1 oz glass of hard liquor (44 mL). Do not use any products that contain nicotine or tobacco. These products include cigarettes, chewing tobacco, and vaping devices, such as  e-cigarettes. If you need help quitting, ask your health care provider. Avoid extreme heat and extreme cold. Limit your caffeine intake, as told by your health care provider. Try to get 8 hours of sleep each night. Find ways to manage your stress, such as meditation or yoga. General instructions Take over-the-counter and prescription medicines only as told by your health care provider. Keep all follow-up visits. This is important. Contact a health care provider if: You develop a tremor after starting a new medicine. You have a tremor along with other symptoms such as: Numbness. Tingling. Pain. Weakness. Your tremor gets worse. Your tremor interferes with your day-to-day life. Summary A tremor is trembling or shaking that you cannot control. Most tremors affect the hands or arms. Some types of tremors have no known cause. Others may be a symptom of nervous system problems (neurological disorders). Make sure you discuss any tremors you have with your health care provider. This information is not intended to replace advice given to you by your health care provider. Make sure you discuss any questions you have with your health care provider. Document Revised: 07/26/2021 Document Reviewed: 07/26/2021 Elsevier Patient Education  2023 [employer].

## 2023-03-09 NOTE — Progress Notes (Signed)
Subjective:   By signing my name below, I, Dustin Spears, attest that this documentation has been prepared under the direction and in the presence of Dustin Edge, MD 03/09/23   Patient ID: Dustin Spears, male    DOB: [birth year], 57 y.o.   MRN: [id number]  Chief Complaint  Patient presents with   Follow-up    Follow up    HPI Patient is in today for a 6 month follow-up.  Hyperlipidemia Treated with rosuvastatin 5 mg daily. Denies negative side effects.  Hypothyroidism  Treated with levothyroxine 50 mcg daily.  Lumbar Stenosis, Sciatica Treated with tramadol 50 mg every 12 hours as needed, gabapentin 300 mg three times daily. Reports Tramadol gives minimal relief. Has received multiple epidurals with some relief. Sleep has been better with injections. Having tingling in left leg. Followed by Dr. Aleen Sells, sports medicine specialist. 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 Compliant with home PT. Considering chiropractor consult.  Restless Legs Syndrome Treated with cyclobenzaprine 5 mg at bedtime as needed. Sleep is stable with this.  Impaired Glucose Tolerance HGBA1c 6.3% on 09/02/22, up from 5.9% on 02/25/22. He is planning to start a meal order diet soon. Not interested in starting weight loss medication at this time. Will work on controlling with healthy diet and exercise.  Wt Readings from Last 3 Encounters:  03/09/23 257 lb 9.6 oz (116.8 kg)  02/18/23 256 lb (116.1 kg)  02/05/23 258 lb (117 kg)   Essential Tremor Left Hand Has noticed intermittent shaking in his left hand recently. Does not interfere with his job. Not aware of any family history of tremor.  Testosterone Deficiency Treated with testosterone 2% gel 4 pumps topically daily. Reports having first testosterone injection in 01/2023. Requesting testosterone check.  Vaccine Counseling: will be getting tetanus update today. Discussed shingles vaccine.  Denies GI symptoms, respiratory  symptoms, allergy issues. Taking OTC allergy medication.  Labs are pending.  Past Medical History:  Diagnosis Date   ALLERGIC RHINITIS 03/12/2010   Allergy    seasonal   Bradycardia 01/11/2017   FATIGUE 03/12/2010   Headache(784.0) 03/12/2010   History of chicken pox    Hyperglycemia 07/10/2016   Hyperlipidemia    Knee pain, right    Morbid obesity (HCC) 02/26/2011   Morbid obesity (HCC) 02/26/2011   Neck pain, acute 03/12/2011   Overweight(278.02) 02/26/2011   Preventative health care 11/04/2012   RLS (restless legs syndrome) 01/08/2017   SLEEP APNEA, OBSTRUCTIVE 04/24/2010   SNORING 03/12/2010   Sports physical 07/16/2012   Tendonitis 05/19/2012   Testosterone deficiency 10/19/2011   Thyroid disease     Past Surgical History:  Procedure Laterality Date   bicep tendon in left arm  2000    Family History  Problem Relation Age of Onset   Hyperlipidemia Mother    Cataracts Mother    Lupus Father    Alcohol abuse Father        smoked   Arthritis Father    Arthritis Maternal Grandmother    Ulcers Maternal Grandmother    Alzheimer's disease Maternal Grandfather    Lupus Paternal Grandmother    Other Other        lupus   Sleep apnea Neg Hx     Social History   Socioeconomic History   Marital status: Married    Spouse name: Not on file   Number of children: 1   Years of education: Not on file   Highest education level: Some college, no degree  Occupational History   Occupation: Education officer, 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- [employer]    Employer: [employer]   Occupation: [employer]  Tobacco Use   Smoking status: Never   Smokeless tobacco: Never  Vaping Use   Vaping Use: Never used  Substance and Sexual Activity   Alcohol use: Yes    Comment: occasional   Drug use: No   Sexual activity: Yes  Other Topics Concern   Not on file  Social History Narrative   [place]   Lives at home with wife   Right handed   Caffeine: 3-4 cups/day   Social Determinants of Health   Financial  Resource Strain: Low Risk  (03/09/2023)   Overall Financial Resource Strain (CARDIA)    Difficulty of Paying Living Expenses: Not hard at all  Food Insecurity: No Food Insecurity (03/09/2023)   Hunger Vital Sign    Worried About Running Out of Food in the Last Year: Never true    Ran Out of Food in the Last Year: Never true  Transportation Needs: No Transportation Needs (03/09/2023)   PRAPARE - Administrator, Civil Service (Medical): No    Lack of Transportation (Non-Medical): No  Physical Activity: Sufficiently Active (03/09/2023)   Exercise Vital Sign    Days of Exercise per Week: 5 days    Minutes of Exercise per Session: 90 min  Stress: No Stress Concern Present (03/09/2023)   [employer] of Occupational Health - Occupational Stress Questionnaire    Feeling of Stress : Not at all  Social Connections: Moderately Isolated (03/09/2023)   Social Connection and Isolation Panel [NHANES]    Frequency of Communication with Friends and Family: More than three times a week    Frequency of Social Gatherings with Friends and Family: Once a week    Attends Religious Services: Never    Database administrator or Organizations: No    Attends Engineer, structural: Not on file    Marital Status: Married  Catering manager Violence: Not on file    Outpatient Medications Prior to Visit  Medication Sig Dispense Refill   Calcium Citrate-Vitamin D 200-250 MG-UNIT TABS Take 1 capsule by mouth daily. 120 tablet 0   cetirizine (ZYRTEC) 10 MG tablet Take 10 mg by mouth daily. Reported on 01/03/2016     Coenzyme Q10 (CO Q 10 PO) Take by mouth.     cyclobenzaprine (FLEXERIL) 5 MG tablet Take 1-2 tablets (5-10 mg total) by mouth at bedtime as needed for muscle spasms. 60 tablet 1   diclofenac (VOLTAREN) 75 MG EC tablet TAKE 1 TABLET BY MOUTH 2 TIMES DAILY AS NEEDED FOR MODERATE PAIN. 180 tablet 1   fish oil-omega-3 fatty acids 1000 MG capsule Take 2 g by mouth daily.     gabapentin  (NEURONTIN) 300 MG capsule TAKE 1 CAPSULE BY MOUTH THREE TIMES A DAY 90 capsule 0   levothyroxine (SYNTHROID) 50 MCG tablet TAKE 1 TABLET BY MOUTH EVERY DAY 85 tablet 1   MAGNESIUM PO Take by mouth.     Misc. Devices (NASADOCK) MISC by Does not apply route.     Multiple Vitamins-Minerals (ZINC PO) Take by mouth.     rosuvastatin (CRESTOR) 5 MG tablet TAKE 1 TABLET (5 MG TOTAL) BY MOUTH DAILY. 90 tablet 1   Testosterone 10 MG/ACT (2%) GEL Apply 4 Pump topically daily. 60 g 5   traMADol (ULTRAM) 50 MG tablet Take 1 tablet (50 mg total) by mouth every 12 (  twelve) hours as needed. 30 tablet 0   UNABLE TO FIND Med Name: Tumeric     Wheat Dextrin (BENEFIBER) POWD 2 tsp po daily in liquids 730 g 0   gabapentin (NEURONTIN) 100 MG capsule Use 1 to 2 tablets nightly as needed for symptom relief 60 capsule 0   LORazepam (ATIVAN) 0.5 MG tablet 1-2 tabs 30 - 60 min prior to MRI. Do not drive with this medicine. 4 tablet 0   methylPREDNISolone (MEDROL DOSEPAK) 4 MG TBPK tablet Take 6 tablets on day 1.  Take 5 tablets on day 2.  Take 4 tablets on day 3.  Take 3 tablets on day 4.  Take 2 tablets on day 5.  Take 1 tablet on day 6. 21 tablet 0   No facility-administered medications prior to visit.    Allergies  Allergen Reactions   Lipitor [Atorvastatin] Other (See Comments)    Memory issues    Review of Systems  Constitutional:  Negative for fever and malaise/fatigue.  HENT:  Negative for congestion.   Eyes:  Negative for blurred vision.  Respiratory:  Negative for cough and shortness of breath.   Cardiovascular:  Negative for chest pain, palpitations and leg swelling.  Gastrointestinal:  Negative for vomiting.  Musculoskeletal:  Positive for back pain (Lumbar, sciatica), joint pain and myalgias.  Skin:  Negative for rash.  Neurological:  Positive for tingling (Left leg) and tremors (Left hand). Negative for loss of consciousness and headaches.       Objective:    Physical Exam Constitutional:       General: He is not in acute distress.    Appearance: He is well-developed. He is not diaphoretic.  HENT:     Head: Normocephalic and atraumatic.     Right Ear: External ear normal.     Left Ear: External ear normal.     Nose: Nose normal.     Mouth/Throat:     Pharynx: No oropharyngeal exudate.  Eyes:     General:        Right eye: No discharge.        Left eye: No discharge.     Conjunctiva/sclera: Conjunctivae normal.     Pupils: Pupils are equal, round, and reactive to light.  Neck:     Thyroid: No thyromegaly.     Vascular: No JVD.  Cardiovascular:     Rate and Rhythm: Normal rate and regular rhythm.     Heart sounds: No murmur heard.    No friction rub. No gallop.  Pulmonary:     Effort: Pulmonary effort is normal. No respiratory distress.     Breath sounds: Normal breath sounds. No wheezing or rales.  Chest:     Chest wall: No tenderness.  Abdominal:     General: Bowel sounds are normal. There is no distension.     Palpations: Abdomen is soft. There is no mass.     Tenderness: There is no abdominal tenderness. There is no guarding or rebound.  Genitourinary:    Penis: Normal.      Prostate: Normal.     Rectum: Normal. Guaiac result negative.  Musculoskeletal:        General: No tenderness. Normal range of motion.     Cervical back: Normal range of motion and neck supple.  Lymphadenopathy:     Cervical: No cervical adenopathy.  Skin:    General: Skin is warm and dry.     Coloration: Skin is not pale.     Findings:  No erythema or rash.  Neurological:     General: No focal deficit present.     Mental Status: He is alert and oriented to person, place, and time.     Cranial Nerves: Cranial nerves 2-12 are intact.     Sensory: Sensation is intact.     Motor: Motor function is intact. No abnormal muscle tone.     Deep Tendon Reflexes: Reflexes are normal and symmetric. Reflexes normal.  Psychiatric:        Behavior: Behavior normal.        Thought Content:  Thought content normal.        Judgment: Judgment normal.     BP 120/82 (BP Location: Right Arm, Patient Position: Sitting, Cuff Size: Normal)   Pulse 79   Temp 98 F (36.7 C) (Oral)   Resp 16   Ht 5\' 9"  (1.753 m)   Wt 257 lb 9.6 oz (116.8 kg)   SpO2 95%   BMI 38.04 kg/m  Wt Readings from Last 3 Encounters:  03/09/23 257 lb 9.6 oz (116.8 kg)  02/18/23 256 lb (116.1 kg)  02/05/23 258 lb (117 kg)       Assessment & Plan:  Hyperglycemia Assessment & Plan: hgba1c acceptable, minimize simple carbs. Increase exercise as tolerated.  Orders: -     Hemoglobin A1c -     Comprehensive metabolic panel  Mixed hyperlipidemia Assessment & Plan: Encourage heart healthy diet such as MIND or DASH diet, increase exercise, avoid trans fats, simple carbohydrates and processed foods, consider a krill or fish or flaxseed oil cap daily.  He was tolerating his statin but has not been taking it to see if it helped his pain but it did not so he restarted it yesterday  Orders: -     Lipid panel  Testosterone deficiency Assessment & Plan: Supplement and monitor   Orders: -     Testosterone  Preventative health care  Bradycardia -     CBC with Differential/Platelet  Morbid obesity (HCC)  Other specified hypothyroidism -     TSH  Need for Tdap vaccination -     Tdap vaccine greater than or equal to 7yo IM  Low back pain with left-sided sciatica, unspecified back pain laterality, unspecified chronicity Assessment & Plan: Injections in his back with sports medicine has been helpful and he finds the pain manageable at the current time. .Encouraged moist heat and gentle stretching as tolerated. May try NSAIDs and prescription meds as directed and report if symptoms worsen or seek immediate care    Other orders -     Gabapentin; Take 1 capsule (100 mg total) by mouth 2 (two) times daily as needed. Use 1 to 2 tablets nightly as needed for symptom relief  Dispense: 60 capsule; Refill:  1     I,Alexander Ruley,acting as a scribe for Dustin Edge, MD.,have documented all relevant documentation on the behalf of Dustin Edge, MD,as directed by  Dustin Edge, MD while in the presence of Dustin Edge, MD.

## 2023-03-10 LAB — TESTOSTERONE: Testosterone: 675.02 ng/dL (ref 300.00–890.00)

## 2023-03-10 LAB — COMPREHENSIVE METABOLIC PANEL
ALT: 64 U/L — ABNORMAL HIGH (ref 0–53)
AST: 36 U/L (ref 0–37)
Albumin: 4.6 g/dL (ref 3.5–5.2)
Alkaline Phosphatase: 33 U/L — ABNORMAL LOW (ref 39–117)
BUN: 22 mg/dL (ref 6–23)
CO2: 24 mEq/L (ref 19–32)
Calcium: 9.8 mg/dL (ref 8.4–10.5)
Chloride: 102 mEq/L (ref 96–112)
Creatinine, Ser: 1.01 mg/dL (ref 0.40–1.50)
GFR: 82.23 mL/min (ref >60.00)
Glucose, Bld: 83 mg/dL (ref 70–99)
Potassium: 4.1 mEq/L (ref 3.5–5.1)
Sodium: 137 mEq/L (ref 135–145)
Total Bilirubin: 0.5 mg/dL (ref 0.2–1.2)
Total Protein: 7.7 g/dL (ref 6.0–8.3)

## 2023-03-10 LAB — LIPID PANEL
Cholesterol: 268 mg/dL — ABNORMAL HIGH (ref 0–200)
HDL: 43.2 mg/dL (ref >39.00)
LDL Cholesterol: 189 mg/dL — ABNORMAL HIGH (ref 0–99)
NonHDL: 225.08
Total CHOL/HDL Ratio: 6
Triglycerides: 178 mg/dL — ABNORMAL HIGH (ref 0.0–149.0)
VLDL: 35.6 mg/dL (ref 0.0–40.0)

## 2023-03-10 LAB — CBC WITH DIFFERENTIAL/PLATELET
Basophils Absolute: 0.1 10*3/uL (ref 0.0–0.1)
Basophils Relative: 1.4 % (ref 0.0–3.0)
Eosinophils Absolute: 0.1 10*3/uL (ref 0.0–0.7)
Eosinophils Relative: 1.6 % (ref 0.0–5.0)
HCT: 48.4 % (ref 39.0–52.0)
Hemoglobin: 16 g/dL (ref 13.0–17.0)
Lymphocytes Relative: 29.2 % (ref 12.0–46.0)
Lymphs Abs: 2.6 10*3/uL (ref 0.7–4.0)
MCHC: 33.1 g/dL (ref 30.0–36.0)
MCV: 90.7 fl (ref 78.0–100.0)
Monocytes Absolute: 0.7 10*3/uL (ref 0.1–1.0)
Monocytes Relative: 8.4 % (ref 3.0–12.0)
Neutro Abs: 5.2 10*3/uL (ref 1.4–7.7)
Neutrophils Relative %: 59.4 % (ref 43.0–77.0)
Platelets: 269 10*3/uL (ref 150.0–400.0)
RBC: 5.33 Mil/uL (ref 4.22–5.81)
RDW: 14 % (ref 11.5–15.5)
WBC: 8.7 10*3/uL (ref 4.0–10.5)

## 2023-03-10 LAB — TSH: TSH: 4.79 u[IU]/mL (ref 0.35–5.50)

## 2023-03-10 LAB — HEMOGLOBIN A1C: Hgb A1c MFr Bld: 6.3 % (ref 4.6–6.5)

## 2023-03-20 ENCOUNTER — Other Ambulatory Visit: Payer: Self-pay | Admitting: Family Medicine

## 2023-03-20 ENCOUNTER — Other Ambulatory Visit: Payer: Self-pay | Admitting: Sports Medicine

## 2023-05-10 ENCOUNTER — Other Ambulatory Visit: Payer: Self-pay | Admitting: Family Medicine

## 2023-05-10 DIAGNOSIS — E349 Endocrine disorder, unspecified: Secondary | ICD-10-CM

## 2023-06-06 ENCOUNTER — Other Ambulatory Visit: Payer: Self-pay | Admitting: Family Medicine

## 2023-06-06 DIAGNOSIS — G8929 Other chronic pain: Secondary | ICD-10-CM

## 2023-07-01 ENCOUNTER — Telehealth: Payer: Self-pay | Admitting: Family Medicine

## 2023-07-01 ENCOUNTER — Other Ambulatory Visit: Payer: Self-pay | Admitting: Family

## 2023-07-01 DIAGNOSIS — E349 Endocrine disorder, unspecified: Secondary | ICD-10-CM

## 2023-07-01 MED ORDER — TESTOSTERONE 10 MG/ACT (2%) TD GEL: 4.0000 | TRANSDERMAL | 1 refills | Status: DC

## 2023-07-01 NOTE — Telephone Encounter (Signed)
Pt called back and advised the medication was supposed to be routed to Dustin Spears in [place] but it was sent to his pharmacy again (and they don't have it). Please send medication to [facility] and call pt to advise when sent.

## 2023-07-01 NOTE — Telephone Encounter (Signed)
Pt called stating that his testosterone is out of stock at his pharmacy and would like to have it rerouted to the following pharmacy:  [facility] [address] [place], [place] [address] P: [phone]  Pt would like a call or MyChart message sent to him when Rx is sent.

## 2023-07-01 NOTE — Telephone Encounter (Signed)
Medication was sent

## 2023-07-12 ENCOUNTER — Other Ambulatory Visit: Payer: Self-pay | Admitting: Family Medicine

## 2023-08-03 ENCOUNTER — Other Ambulatory Visit: Payer: Self-pay | Admitting: Family Medicine

## 2023-08-03 DIAGNOSIS — E349 Endocrine disorder, unspecified: Secondary | ICD-10-CM

## 2023-08-09 ENCOUNTER — Encounter: Payer: Self-pay | Admitting: Family Medicine

## 2023-08-10 NOTE — Telephone Encounter (Signed)
Pt called stating that he only has two days left of this medication left. Pt was unsure what dosages were available for this medication.

## 2023-08-11 ENCOUNTER — Other Ambulatory Visit: Payer: Self-pay | Admitting: Family Medicine

## 2023-08-11 MED ORDER — TESTOSTERONE 20.25 MG/ACT (1.62%) TD GEL: 20.2500 mg | Freq: Every day | TRANSDERMAL | 3 refills | Status: DC

## 2023-08-27 ENCOUNTER — Other Ambulatory Visit: Payer: Self-pay | Admitting: Family Medicine

## 2023-08-27 DIAGNOSIS — M25561 Pain in right knee: Secondary | ICD-10-CM

## 2023-08-31 ENCOUNTER — Encounter: Payer: Self-pay | Admitting: *Deleted

## 2023-09-01 ENCOUNTER — Telehealth: Payer: BC Managed Care – PPO | Admitting: Adult Health

## 2023-09-01 ENCOUNTER — Telehealth: Payer: Self-pay | Admitting: Neurology

## 2023-09-01 NOTE — Telephone Encounter (Signed)
Pt cancelled MyChart Video Visit appt due to scheduling conflict. Reschedule appt as a office visit, Pt said office visit would work better due to do not have app on my phone.

## 2023-09-13 ENCOUNTER — Other Ambulatory Visit: Payer: Self-pay | Admitting: Family Medicine

## 2023-09-13 DIAGNOSIS — G8929 Other chronic pain: Secondary | ICD-10-CM

## 2023-09-14 NOTE — Telephone Encounter (Signed)
Requesting: Tramadol 50mg  Contract: 09/02/2022 UDS: 09/02/2022 Last Visit: 03/09/2023 Next Visit: 03/06/2024 Last Refill: 06/08/2023  Please Advise

## 2023-09-21 ENCOUNTER — Encounter: Payer: BC Managed Care – PPO | Admitting: Family Medicine

## 2023-09-27 ENCOUNTER — Other Ambulatory Visit: Payer: Self-pay | Admitting: Family Medicine

## 2023-11-17 DIAGNOSIS — L820 Inflamed seborrheic keratosis: Secondary | ICD-10-CM | POA: Diagnosis not present

## 2023-11-17 DIAGNOSIS — D485 Neoplasm of uncertain behavior of skin: Secondary | ICD-10-CM | POA: Diagnosis not present

## 2023-12-04 ENCOUNTER — Encounter: Payer: Self-pay | Admitting: Adult Health

## 2023-12-04 ENCOUNTER — Ambulatory Visit: Payer: BC Managed Care – PPO | Admitting: Adult Health

## 2023-12-04 VITALS — Ht 69.0 in | Wt 260.0 lb

## 2023-12-04 DIAGNOSIS — G4733 Obstructive sleep apnea (adult) (pediatric): Secondary | ICD-10-CM | POA: Diagnosis not present

## 2023-12-04 NOTE — Patient Instructions (Signed)
Continue using CPAP nightly and greater than 4 hours each night If your symptoms worsen or you develop new symptoms please let us know.

## 2023-12-04 NOTE — Progress Notes (Signed)
PATIENT: Dustin Spears DOB: 01-May-1966  REASON FOR VISIT: follow up HISTORY FROM: patient PRIMARY NEUROLOGIST: Dr. Frances Furbish     PATIENT: Dustin Spears DOB: [birth year]  REASON FOR VISIT: follow up HISTORY FROM: patient  Chief Complaint  Patient presents with   Obstructive Sleep Apnea    Rm 3 alone Pt is well and stable, reports no new concerns with OSA/CPAP     HISTORY OF PRESENT ILLNESS: Today 12/04/23:  Dustin Spears is a 58 y.o. male with a history of OSA on CPAP. Returns today for follow-up. Reports that CPAP is working well. Denies any new issues. Continues to find it beneficial. Uses a full face mask.        REVIEW OF SYSTEMS: Out of a complete 14 system review of symptoms, the patient complains only of the following symptoms, and all other reviewed systems are negative.  ALLERGIES: Allergies  Allergen Reactions   Lipitor [Atorvastatin] Other (See Comments)    Memory issues    HOME MEDICATIONS: Outpatient Medications Prior to Visit  Medication Sig Dispense Refill   Calcium Citrate-Vitamin D 200-250 MG-UNIT TABS Take 1 capsule by mouth daily. 120 tablet 0   cetirizine (ZYRTEC) 10 MG tablet Take 10 mg by mouth daily. Reported on 01/03/2016     Coenzyme Q10 (CO Q 10 PO) Take by mouth.     diclofenac (VOLTAREN) 75 MG EC tablet TAKE 1 TABLET BY MOUTH TWICE A DAY AS NEEDED FOR MODERATE PAIN 180 tablet 1   fish oil-omega-3 fatty acids 1000 MG capsule Take 2 g by mouth daily.     levothyroxine (SYNTHROID) 50 MCG tablet TAKE 1 TABLET BY MOUTH EVERY DAY 85 tablet 1   MAGNESIUM PO Take by mouth.     Misc. Devices (NASADOCK) MISC by Does not apply route.     Multiple Vitamins-Minerals (ZINC PO) Take by mouth.     rosuvastatin (CRESTOR) 5 MG tablet TAKE 1 TABLET (5 MG TOTAL) BY MOUTH DAILY. 90 tablet 1   traMADol (ULTRAM) 50 MG tablet TAKE 1 TABLET BY MOUTH EVERY 12 HOURS AS NEEDED 30 tablet 0   UNABLE TO FIND Med Name: Tumeric     Wheat Dextrin (BENEFIBER) POWD 2 tsp  po daily in liquids 730 g 0   Testosterone 20.25 MG/ACT (1.62%) GEL Place 20.25 mg onto the skin daily in the afternoon for 30 doses. 75 g 3   cyclobenzaprine (FLEXERIL) 5 MG tablet Take 1-2 tablets (5-10 mg total) by mouth at bedtime as needed for muscle spasms. (Patient not taking: Reported on 12/04/2023) 60 tablet 1   gabapentin (NEURONTIN) 100 MG capsule Take 1 capsule (100 mg total) by mouth 2 (two) times daily as needed. Use 1 to 2 tablets nightly as needed for symptom relief (Patient not taking: Reported on 12/04/2023) 60 capsule 1   gabapentin (NEURONTIN) 300 MG capsule TAKE 1 CAPSULE BY MOUTH THREE TIMES A DAY (Patient not taking: Reported on 12/04/2023) 90 capsule 0   No facility-administered medications prior to visit.    PAST MEDICAL HISTORY: Past Medical History:  Diagnosis Date   ALLERGIC RHINITIS 03/12/2010   Allergy    seasonal   Bradycardia 01/11/2017   FATIGUE 03/12/2010   Headache(784.0) 03/12/2010   History of chicken pox    Hyperglycemia 07/10/2016   Hyperlipidemia    Knee pain, right    Morbid obesity (HCC) 02/26/2011   Morbid obesity (HCC) 02/26/2011   Neck pain, acute 03/12/2011   Overweight(278.02) 02/26/2011   Preventative health care  11/04/2012   RLS (restless legs syndrome) 01/08/2017   SLEEP APNEA, OBSTRUCTIVE 04/24/2010   SNORING 03/12/2010   Sports physical 07/16/2012   Tendonitis 05/19/2012   Testosterone deficiency 10/19/2011   Thyroid disease     PAST SURGICAL HISTORY: Past Surgical History:  Procedure Laterality Date   bicep tendon in left arm  2000    FAMILY HISTORY: Family History  Problem Relation Age of Onset   Hyperlipidemia Mother    Cataracts Mother    Lupus Father    Alcohol abuse Father        smoked   Arthritis Father    Arthritis Maternal Grandmother    Ulcers Maternal Grandmother    Alzheimer's disease Maternal Grandfather    Lupus Paternal Grandmother    Other Other        lupus   Sleep apnea Neg Hx     SOCIAL HISTORY: Social  History   Socioeconomic History   Marital status: Married    Spouse name: Not on file   Number of children: 1   Years of education: Not on file   Highest education level: Some college, no degree  Occupational History   Occupation: Education officer, 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- [employer]    Employer: [employer]   Occupation: [employer]  Tobacco Use   Smoking status: Never   Smokeless tobacco: Never  Vaping Use   Vaping status: Never Used  Substance and Sexual Activity   Alcohol use: Yes    Comment: occasional   Drug use: No   Sexual activity: Yes  Other Topics Concern   Not on file  Social History Narrative   [place]   Lives at home with wife   Right handed   Caffeine: 3-4 cups/day   Social Drivers of Corporate investment banker Strain: Low Risk  (03/09/2023)   Overall Financial Resource Strain (CARDIA)    Difficulty of Paying Living Expenses: Not hard at all  Food Insecurity: No Food Insecurity (03/09/2023)   Hunger Vital Sign    Worried About Running Out of Food in the Last Year: Never true    Ran Out of Food in the Last Year: Never true  Transportation Needs: No Transportation Needs (03/09/2023)   PRAPARE - Administrator, Civil Service (Medical): No    Lack of Transportation (Non-Medical): No  Physical Activity: Sufficiently Active (03/09/2023)   Exercise Vital Sign    Days of Exercise per Week: 5 days    Minutes of Exercise per Session: 90 min  Stress: No Stress Concern Present (03/09/2023)   [employer] of Occupational Health - Occupational Stress Questionnaire    Feeling of Stress : Not at all  Social Connections: Moderately Isolated (03/09/2023)   Social Connection and Isolation Panel [NHANES]    Frequency of Communication with Friends and Family: More than three times a week    Frequency of Social Gatherings with Friends and Family: Once a week    Attends Religious Services: Never    Database administrator or Organizations: No    Attends Museum/gallery exhibitions officer: Not on file    Marital Status: Married  Catering manager Violence: Not on file      PHYSICAL EXAM Generalized: Well developed, in no acute distress  Chest: Lungs clear to auscultation   Neurological examination  Mentation: Alert oriented to time, place, history taking. Follows all commands speech and language fluent Cranial nerve II-XII: Facial symmetry noted.   DIAGNOSTIC DATA (  LABS, IMAGING, TESTING) - I reviewed patient records, labs, notes, testing and imaging myself where available.  Lab Results  Component Value Date   WBC 8.7 03/09/2023   HGB 16.0 03/09/2023   HCT 48.4 03/09/2023   MCV 90.7 03/09/2023   PLT 269.0 03/09/2023      Component Value Date/Time   NA 137 03/09/2023 1632   K 4.1 03/09/2023 1632   CL 102 03/09/2023 1632   CO2 24 03/09/2023 1632   GLUCOSE 83 03/09/2023 1632   BUN 22 03/09/2023 1632   CREATININE 1.01 03/09/2023 1632   CREATININE 0.89 09/07/2015 1631   CALCIUM 9.8 03/09/2023 1632   PROT 7.7 03/09/2023 1632   ALBUMIN 4.6 03/09/2023 1632   AST 36 03/09/2023 1632   ALT 64 (H) 03/09/2023 1632   ALKPHOS 33 (L) 03/09/2023 1632   BILITOT 0.5 03/09/2023 1632   Lab Results  Component Value Date   CHOL 268 (H) 03/09/2023   HDL 43.20 03/09/2023   LDLCALC 189 (H) 03/09/2023   LDLDIRECT 153.0 09/02/2022   TRIG 178.0 (H) 03/09/2023   CHOLHDL 6 03/09/2023   Lab Results  Component Value Date   HGBA1C 6.3 03/09/2023   No results found for: "VITAMINB12" Lab Results  Component Value Date   TSH 4.79 03/09/2023      ASSESSMENT AND PLAN 58 y.o. year old male  has a past medical history of ALLERGIC RHINITIS (03/12/2010), Allergy, Bradycardia (01/11/2017), FATIGUE (03/12/2010), Headache(784.0) (03/12/2010), History of chicken pox, Hyperglycemia (07/10/2016), Hyperlipidemia, Knee pain, right, Morbid obesity (HCC) (02/26/2011), Morbid obesity (HCC) (02/26/2011), Neck pain, acute (03/12/2011), Overweight(278.02) (02/26/2011), Preventative  health care (11/04/2012), RLS (restless legs syndrome) (01/08/2017), SLEEP APNEA, OBSTRUCTIVE (04/24/2010), SNORING (03/12/2010), Sports physical (07/16/2012), Tendonitis (05/19/2012), Testosterone deficiency (10/19/2011), and Thyroid disease. here with:  OSA on CPAP  CPAP compliance excellent Residual AHI is good Encouraged patient to continue using CPAP nightly and > 4 hours each night F/U in 1 year or sooner if needed    Butch Penny, MSN, NP-C 12/04/2023, 10:33 AM [facility] [address], [address] [place], [place] [address] [phone]

## 2023-12-05 ENCOUNTER — Other Ambulatory Visit: Payer: Self-pay | Admitting: Family Medicine

## 2024-01-04 ENCOUNTER — Other Ambulatory Visit: Payer: Self-pay | Admitting: Family Medicine

## 2024-01-04 NOTE — Telephone Encounter (Signed)
 Requesting: Testosterone 1.62% Contract: N/A UDS: N/A  Last Visit: 03/09/2023 Next Visit: 02/23/2024 Last Refill: 08/11/2023  Please Advise

## 2024-02-03 ENCOUNTER — Other Ambulatory Visit: Payer: Self-pay | Admitting: Family Medicine

## 2024-02-07 ENCOUNTER — Other Ambulatory Visit: Payer: Self-pay | Admitting: Family Medicine

## 2024-02-07 DIAGNOSIS — G8929 Other chronic pain: Secondary | ICD-10-CM

## 2024-02-08 NOTE — Telephone Encounter (Signed)
 Requesting: Tramadol  50 MG Contract: No UDS: 09/02/2022 Last Visit: 03/09/2023 Next Visit: Visit date not found Last Refill: 09/14/2023  Please Advise

## 2024-02-23 ENCOUNTER — Encounter: Payer: BC Managed Care – PPO | Admitting: Family Medicine

## 2024-02-25 ENCOUNTER — Encounter: Payer: BC Managed Care – PPO | Admitting: Family Medicine

## 2024-04-08 ENCOUNTER — Encounter: Payer: Self-pay | Admitting: Family Medicine

## 2024-04-26 DIAGNOSIS — Z0000 Encounter for general adult medical examination without abnormal findings: Secondary | ICD-10-CM | POA: Diagnosis not present

## 2024-04-26 DIAGNOSIS — Z130 Encounter for screening for diseases of the blood and blood-forming organs and certain disorders involving the immune mechanism: Secondary | ICD-10-CM | POA: Diagnosis not present

## 2024-04-26 DIAGNOSIS — E039 Hypothyroidism, unspecified: Secondary | ICD-10-CM | POA: Diagnosis not present

## 2024-04-26 DIAGNOSIS — E782 Mixed hyperlipidemia: Secondary | ICD-10-CM | POA: Diagnosis not present

## 2024-04-26 DIAGNOSIS — Z1330 Encounter for screening examination for mental health and behavioral disorders, unspecified: Secondary | ICD-10-CM | POA: Diagnosis not present

## 2024-04-26 DIAGNOSIS — R7989 Other specified abnormal findings of blood chemistry: Secondary | ICD-10-CM | POA: Diagnosis not present

## 2024-05-18 DIAGNOSIS — Z1211 Encounter for screening for malignant neoplasm of colon: Secondary | ICD-10-CM | POA: Diagnosis not present

## 2024-05-26 LAB — COLOGUARD: COLOGUARD: NEGATIVE

## 2024-06-03 ENCOUNTER — Other Ambulatory Visit: Payer: Self-pay | Admitting: Family Medicine

## 2024-06-03 DIAGNOSIS — M25561 Pain in right knee: Secondary | ICD-10-CM

## 2024-06-03 NOTE — Telephone Encounter (Signed)
 Rx for diclofenac  denied. Last OV 03/09/23, letter sent to Pt on 04/08/24 informing he is overdue for appt.

## 2024-06-06 ENCOUNTER — Other Ambulatory Visit: Payer: Self-pay | Admitting: Family Medicine

## 2024-06-06 ENCOUNTER — Encounter: Payer: Self-pay | Admitting: *Deleted

## 2024-07-04 DIAGNOSIS — H6993 Unspecified Eustachian tube disorder, bilateral: Secondary | ICD-10-CM | POA: Diagnosis not present

## 2024-07-12 ENCOUNTER — Other Ambulatory Visit: Payer: Self-pay | Admitting: Family Medicine

## 2024-07-12 DIAGNOSIS — M25561 Pain in right knee: Secondary | ICD-10-CM

## 2024-08-09 ENCOUNTER — Other Ambulatory Visit: Payer: Self-pay | Admitting: Family Medicine

## 2024-09-03 ENCOUNTER — Other Ambulatory Visit: Payer: Self-pay | Admitting: Family Medicine

## 2024-12-13 ENCOUNTER — Ambulatory Visit: Payer: BC Managed Care – PPO | Admitting: Adult Health
